# Patient Record
Sex: Male | Born: 1950
Health system: Southern US, Community
[De-identification: ages and names within clinical notes are randomized; demographics above are authoritative.]

## PROBLEM LIST (undated history)

## (undated) DIAGNOSIS — F419 Anxiety disorder, unspecified: Secondary | ICD-10-CM

## (undated) DIAGNOSIS — D759 Disease of blood and blood-forming organs, unspecified: Secondary | ICD-10-CM

## (undated) DIAGNOSIS — H9190 Unspecified hearing loss, unspecified ear: Secondary | ICD-10-CM

## (undated) DIAGNOSIS — G4730 Sleep apnea, unspecified: Secondary | ICD-10-CM

## (undated) DIAGNOSIS — N200 Calculus of kidney: Secondary | ICD-10-CM

## (undated) DIAGNOSIS — J189 Pneumonia, unspecified organism: Secondary | ICD-10-CM

## (undated) DIAGNOSIS — I82409 Acute embolism and thrombosis of unspecified deep veins of unspecified lower extremity: Secondary | ICD-10-CM

## (undated) DIAGNOSIS — C801 Malignant (primary) neoplasm, unspecified: Secondary | ICD-10-CM

## (undated) DIAGNOSIS — F329 Major depressive disorder, single episode, unspecified: Secondary | ICD-10-CM

## (undated) DIAGNOSIS — S065X9A Traumatic subdural hemorrhage with loss of consciousness of unspecified duration, initial encounter: Secondary | ICD-10-CM

## (undated) DIAGNOSIS — I2699 Other pulmonary embolism without acute cor pulmonale: Secondary | ICD-10-CM

## (undated) DIAGNOSIS — E210 Primary hyperparathyroidism: Secondary | ICD-10-CM

## (undated) DIAGNOSIS — Z7901 Long term (current) use of anticoagulants: Secondary | ICD-10-CM

## (undated) DIAGNOSIS — N390 Urinary tract infection, site not specified: Secondary | ICD-10-CM

## (undated) DIAGNOSIS — K219 Gastro-esophageal reflux disease without esophagitis: Secondary | ICD-10-CM

## (undated) DIAGNOSIS — F411 Generalized anxiety disorder: Secondary | ICD-10-CM

## (undated) DIAGNOSIS — G4733 Obstructive sleep apnea (adult) (pediatric): Secondary | ICD-10-CM

## (undated) HISTORY — DX: Obstructive sleep apnea (adult) (pediatric): G47.33

## (undated) HISTORY — DX: Primary hyperparathyroidism: E21.0

## (undated) HISTORY — DX: Generalized anxiety disorder: F41.1

## (undated) HISTORY — PX: BRAIN SURGERY: SHX531

## (undated) HISTORY — PX: COLONOSCOPY: SHX174

## (undated) HISTORY — DX: Unspecified hearing loss, unspecified ear: H91.90

## (undated) HISTORY — DX: Major depressive disorder, single episode, unspecified: F32.9

## (undated) HISTORY — PX: LITHOTRIPSY: SUR834

## (undated) HISTORY — DX: Long term (current) use of anticoagulants: Z79.01

---

## 1992-01-07 DIAGNOSIS — S065XAA Traumatic subdural hemorrhage with loss of consciousness status unknown, initial encounter: Secondary | ICD-10-CM

## 1992-01-07 DIAGNOSIS — S065X9A Traumatic subdural hemorrhage with loss of consciousness of unspecified duration, initial encounter: Secondary | ICD-10-CM

## 1992-01-07 HISTORY — DX: Traumatic subdural hemorrhage with loss of consciousness of unspecified duration, initial encounter: S06.5X9A

## 1992-01-07 HISTORY — DX: Traumatic subdural hemorrhage with loss of consciousness status unknown, initial encounter: S06.5XAA

## 1998-11-23 ENCOUNTER — Encounter: Payer: Self-pay | Admitting: Internal Medicine

## 1998-11-23 ENCOUNTER — Encounter: Admission: RE | Admit: 1998-11-23 | Discharge: 1998-11-23 | Payer: Self-pay | Admitting: Internal Medicine

## 1999-05-08 ENCOUNTER — Emergency Department (HOSPITAL_COMMUNITY): Admission: EM | Admit: 1999-05-08 | Discharge: 1999-05-08 | Payer: Self-pay | Admitting: Emergency Medicine

## 2000-01-20 ENCOUNTER — Encounter: Payer: Self-pay | Admitting: Gastroenterology

## 2000-01-20 ENCOUNTER — Encounter: Admission: RE | Admit: 2000-01-20 | Discharge: 2000-01-20 | Payer: Self-pay | Admitting: Gastroenterology

## 2002-02-02 ENCOUNTER — Ambulatory Visit (HOSPITAL_COMMUNITY): Admission: RE | Admit: 2002-02-02 | Discharge: 2002-02-02 | Payer: Self-pay | Admitting: Specialist

## 2004-02-27 ENCOUNTER — Ambulatory Visit (HOSPITAL_COMMUNITY): Admission: RE | Admit: 2004-02-27 | Discharge: 2004-02-27 | Payer: Self-pay | Admitting: Dermatology

## 2004-03-04 ENCOUNTER — Ambulatory Visit (HOSPITAL_COMMUNITY): Admission: RE | Admit: 2004-03-04 | Discharge: 2004-03-04 | Payer: Self-pay | Admitting: Dermatology

## 2004-03-04 ENCOUNTER — Inpatient Hospital Stay (HOSPITAL_COMMUNITY): Admission: EM | Admit: 2004-03-04 | Discharge: 2004-03-08 | Payer: Self-pay | Admitting: Emergency Medicine

## 2005-08-04 ENCOUNTER — Ambulatory Visit (HOSPITAL_COMMUNITY): Admission: RE | Admit: 2005-08-04 | Discharge: 2005-08-04 | Payer: Self-pay | Admitting: Gastroenterology

## 2005-08-04 ENCOUNTER — Encounter (INDEPENDENT_AMBULATORY_CARE_PROVIDER_SITE_OTHER): Payer: Self-pay | Admitting: *Deleted

## 2008-03-20 ENCOUNTER — Emergency Department (HOSPITAL_COMMUNITY): Admission: EM | Admit: 2008-03-20 | Discharge: 2008-03-20 | Payer: Self-pay | Admitting: Emergency Medicine

## 2009-07-02 ENCOUNTER — Emergency Department (HOSPITAL_COMMUNITY): Admission: EM | Admit: 2009-07-02 | Discharge: 2009-07-02 | Payer: Self-pay | Admitting: Family Medicine

## 2010-04-18 LAB — POCT RAPID STREP A (OFFICE): Streptococcus, Group A Screen (Direct): NEGATIVE

## 2010-05-16 ENCOUNTER — Other Ambulatory Visit: Payer: Self-pay | Admitting: Internal Medicine

## 2010-05-16 ENCOUNTER — Ambulatory Visit
Admission: RE | Admit: 2010-05-16 | Discharge: 2010-05-16 | Disposition: A | Discharge status: Home / Self Care | Payer: Commercial Managed Care - PPO | Source: Ambulatory Visit | Attending: Internal Medicine | Admitting: Internal Medicine

## 2010-05-16 DIAGNOSIS — R109 Unspecified abdominal pain: Secondary | ICD-10-CM

## 2010-05-21 ENCOUNTER — Encounter (HOSPITAL_COMMUNITY): Payer: 59 | Attending: Urology

## 2010-05-21 LAB — CBC
HCT: 46.3 % (ref 39.0–52.0)
Hemoglobin: 16.1 g/dL (ref 13.0–17.0)
MCH: 29 pg (ref 26.0–34.0)
MCHC: 34.8 g/dL (ref 30.0–36.0)
MCV: 83.4 fL (ref 78.0–100.0)
Platelets: 196 10*3/uL (ref 150–400)
RBC: 5.55 MIL/uL (ref 4.22–5.81)
RDW: 14 % (ref 11.5–15.5)
WBC: 7.9 10*3/uL (ref 4.0–10.5)

## 2010-05-21 LAB — BASIC METABOLIC PANEL
BUN: 12 mg/dL (ref 6–23)
CO2: 27 mEq/L (ref 19–32)
Calcium: 10.5 mg/dL (ref 8.4–10.5)
Chloride: 105 mEq/L (ref 96–112)
Creatinine, Ser: 1.09 mg/dL (ref 0.4–1.5)
GFR calc Af Amer: >60 mL/min (ref >60)
GFR calc non Af Amer: >60 mL/min (ref >60)
Glucose, Bld: 90 mg/dL (ref 70–99)
Potassium: 4.3 mEq/L (ref 3.5–5.1)
Sodium: 139 mEq/L (ref 135–145)

## 2010-05-21 LAB — APTT: aPTT: 28 seconds (ref 24–37)

## 2010-05-21 LAB — SURGICAL PCR SCREEN
MRSA, PCR: NEGATIVE
Staphylococcus aureus: NEGATIVE

## 2010-05-21 LAB — PROTIME-INR
INR: 1.02 (ref 0.00–1.49)
Prothrombin Time: 13.6 seconds (ref 11.6–15.2)

## 2010-05-23 ENCOUNTER — Ambulatory Visit (HOSPITAL_COMMUNITY)
Admission: RE | Admit: 2010-05-23 | Discharge: 2010-05-23 | Disposition: A | Discharge status: Home / Self Care | Payer: 59 | Source: Ambulatory Visit | Attending: Urology | Admitting: Urology

## 2010-05-23 DIAGNOSIS — Z538 Procedure and treatment not carried out for other reasons: Secondary | ICD-10-CM | POA: Insufficient documentation

## 2010-05-23 DIAGNOSIS — Z01812 Encounter for preprocedural laboratory examination: Secondary | ICD-10-CM | POA: Insufficient documentation

## 2010-05-23 DIAGNOSIS — N201 Calculus of ureter: Secondary | ICD-10-CM | POA: Insufficient documentation

## 2010-05-24 NOTE — Op Note (Signed)
NAME:  Tyler Mason, Tyler Mason                ACCOUNT NO.:  [id number]   MEDICAL RECORD NO.:  [id number]          PATIENT TYPE:  AMB   LOCATION:  ENDO                         FACILITY:  [facility]   PHYSICIAN:  John C. Madilyn Fireman, M.D.    DATE OF BIRTH:  07-08-1950   DATE OF PROCEDURE:  08/04/2005  DATE OF DISCHARGE:                                 OPERATIVE REPORT   ESOPHAGOGASTRODUODENOSCOPY.   INDICATIONS FOR PROCEDURE:  Patient with chronic reflux undergoing screening  colonoscopy, who is undergoing EGD for one-time Barrett's esophagus  screening.   PROCEDURE:  The patient was placed in the left lateral decubitus position  and placed on the pulse monitor with continuous low-flow oxygen delivered by  nasal cannula.  He was sedated with 37.5 mcg IV fentanyl and 3 mg IV Versed.  The Olympus video endoscope was advanced under direct vision into the  oropharynx and esophagus.  The esophagus was straight and of normal caliber  with squamocolumnar line at 38 cm.  No visible hiatal hernia, ring,  stricture or other abnormality at the GE junction.  The stomach was entered  and a small amount of liquid secretions were suctioned from the fundus.  Retroflex view of cardia was unremarkable.  The fundus, body, antrum and  pylorus all appeared normal.  The duodenum was entered and both bulb and  second portion were well inspected and appeared be within normal limits.  Scope was then withdrawn and the patient returned to the recovery room in  stable condition.  He tolerated the procedure well and there were no  immediate complications.   IMPRESSION:  Normal endoscopy.   PLAN:  Will proceed with colonoscopy as planned.           ______________________________  Everardo All. Madilyn Fireman, M.D.     JCH/MEDQ  D:  08/04/2005  T:  08/04/2005  Job:  [id number]

## 2010-05-24 NOTE — H&P (Signed)
NAME:  Tyler Mason, Tyler Mason                ACCOUNT NO.:  [id number]   MEDICAL RECORD NO.:  [id number]          PATIENT TYPE:  OUT   LOCATION:  VASC                         FACILITY:  [facility]   PHYSICIAN:  Georgann Housekeeper, MD      DATE OF BIRTH:  July 11, 1950   DATE OF ADMISSION:  03/04/2004  DATE OF DISCHARGE:                                HISTORY & PHYSICAL   CHIEF COMPLAINT:  Right leg pain and swelling.   HISTORY OF PRESENT ILLNESS:  The patient is 60 years old with history of  depression and psoriasis.  Was at the dermatology clinic, Dr. Joseph Art with Dr.  Scharlene Gloss clinic, and had complained of some right leg swelling and pain for  the last few days, was worse today.  He was sent at my request to the  Doppler study for labs at [facility] which revealed extensive occlusive  DVT on the posterior tibial and peroneal, popliteal, as well as common  femoral veins throughout.  He was admitted for treatment of this.  The  patient had complained of this pain and swelling for the last few days but  worse for the last 24 hours.  No fevers, no chills, no chest pain.  A little  bit of dyspnea on exertion, although the wife says that he has been having  that also, worse over last two days.  He had a fever one time two days ago  of 102.  The patient has been on Raptiva shots for psoriasis for the last  five or six weeks.  He has been had flu-like symptoms, and he has not been  much mobile because of not feeling well.   PAST MEDICAL HISTORY:   ALLERGIES:  AMPICILLIN and VOLTAREN.   MEDICATIONS:  1.  Lexapro 20 mg daily.  2.  Nexium 40 mg daily.  3.  Remeron 30 mg 1/2 tablet daily.  4.  Raptiva shots per dermatology, Dr. Margo Aye.   MEDICAL PROBLEMS:  1.  Depression.  2.  Psoriasis.  3.  __________.  4.  Heterozygous for hemachromatosis.  5.  Evaluation by Dr. Madilyn Fireman, GI, in the past with mild LFT elevations and no      symptoms; normal LFTs now.  6.  Kidney stones.   PAST SURGICAL HISTORY:  1.   Cataract operation.  2.  Subdural hematoma craniotomy in 1994 after a head injury.  3.  Right eye vitrectomy in 1994.   FAMILY HISTORY:  Father died when he was young.  Mother has hypertension.  Three sisters.   SOCIAL HISTORY:  He is married with two sons.  Wife works as a Engineer, civil (consulting) in [employer].  He is disabled after his back injury in 1994.   PHYSICAL EXAMINATION:  VITAL SIGNS:  Temperature 98, blood pressure 108/72,  pulse 90.  GENERAL: Awake and alert, sitting in the chair.  LUNGS: Clear.  CARDIOVASCULAR:  S1 and S2 without murmur.  ABDOMEN:  Soft.  EXTREMITIES:  Right leg swelling and calf tenderness without any redness.  Chronic skin changes on his feet and toes.  The left leg was normal.  Good  pulses in both legs.   LABORATORY DATA AND OTHER STUDIES:  Doppler study findings showed positive  DVT in the common femoral, posterior tibial, and popliteal vessels of the  right leg.   He had some blood work done at the emergency room:  White count 15.5,  hemoglobin 17.4, platelets 154.  Chemistries showed sodium 139, potassium  4.4, BUN 16, creatinine 1.1.  ALT and AST were mildly elevated at 55 and 65,  respectively, with alkaline phosphatase 197.   EKG:  Sinus.   Chest CT was done and showed multiple small bilateral PEs.   Report from radiology.   ASSESSMENT AND PLAN:  Male with recent treatments with Raptiva for psoriasis  with four to five days of leg swelling and deep vein thrombosis extensively  in the right leg.  The spiral CT shows pulmonary embolus.   1.  Right left deep vein thrombosis.  2.  Pulmonary emboli.   PLAN:  1.  Admit to telemetry.  2.  Lovenox subcutaneously.  3.  Coumadin per pharmacy.  4.  Will with get hypercoagulable workup.  5.  Oxygen.  6.  For depression, continue on current medication.   I discussed the case with his wife.  The patient was sent to the emergency  room from the clinic.      KH/MEDQ  D:  03/04/2004  T:  03/04/2004  Job:   [id number]

## 2010-05-24 NOTE — Op Note (Signed)
NAME:  Tyler Mason, Tyler Mason                ACCOUNT NO.:  [id number]   MEDICAL RECORD NO.:  [id number]          PATIENT TYPE:  AMB   LOCATION:  ENDO                         FACILITY:  [facility]   PHYSICIAN:  John C. Madilyn Fireman, M.D.    DATE OF BIRTH:  1950-09-05   DATE OF PROCEDURE:  08/04/2005  DATE OF DISCHARGE:                                 OPERATIVE REPORT   COLONOSCOPY WITH POLYPECTOMY   INDICATIONS FOR PROCEDURE:  Average risk colon cancer screening in a 60-year-  old patient.   PROCEDURE:  The patient was placed in the left lateral decubitus position  and placed on the pulse monitor with continuous low-flow oxygen delivered by  nasal cannula.  He was sedated with 25 mcg IV fentanyl and 2 mg IV Versed.  The Olympus video colonoscope was inserted into the rectum and advanced to  cecum, confirmed by transillumination of McBurney's point and visualization  of ileocecal valve and appendiceal orifice.  Prep was good.  The cecum and  ascending colon appeared normal with no masses, polyps, diverticula or other  mucosal abnormalities.  Within the tranverse colon there was a 8-mm sessile  polyp that was fulgurated by hot biopsy.  The remainder of the transverse  colon appeared normal.  There was a few scattered diverticula in the  descending and sigmoid colon which otherwise appeared normal.  The rectum  appeared normal.  Retroflex view of the anus revealed no obvious internal  hemorrhoids.  The scope was then withdrawn and the patient returned to the  recovery room in stable condition.  He tolerated the procedure well.  There  were no immediate complications.   IMPRESSION:  1.  Transverse colon polyp.  2.  Left-sided diverticulosis.   PLAN:  Await histology to determine method and interval for future colon  screening.           ______________________________  Everardo All. Madilyn Fireman, M.D.     JCH/MEDQ  D:  08/04/2005  T:  08/04/2005  Job:  [id number]

## 2010-07-12 ENCOUNTER — Other Ambulatory Visit: Payer: Self-pay | Admitting: Gastroenterology

## 2010-12-11 ENCOUNTER — Other Ambulatory Visit: Payer: Self-pay | Admitting: Urology

## 2010-12-25 ENCOUNTER — Encounter (HOSPITAL_COMMUNITY): Payer: Self-pay | Admitting: Pharmacy Technician

## 2011-01-02 ENCOUNTER — Encounter (HOSPITAL_COMMUNITY): Payer: Self-pay | Admitting: *Deleted

## 2011-01-02 NOTE — Pre-Procedure Instructions (Signed)
Patient instructed by Dr. Isidore Moos to stop coumadin Jan 1st. Patient also told to stop vitamins, naproxen, aspirin,ibuprofen,prior to litho. To arrive in SS at [facility] at 0915. NPO after Midnight Sunday. May take meds with a sip if needed. To follow laxative instructions in blue folder. Patient to bring driver, picture ID, insurance info. Patient verbalized understanding of instructions.

## 2011-01-13 ENCOUNTER — Ambulatory Visit (HOSPITAL_COMMUNITY)
Admission: RE | Admit: 2011-01-13 | Discharge: 2011-01-13 | Disposition: A | Discharge status: Home / Self Care | Payer: 59 | Source: Ambulatory Visit | Attending: Urology | Admitting: Urology

## 2011-01-13 ENCOUNTER — Ambulatory Visit (HOSPITAL_COMMUNITY): Payer: 59

## 2011-01-13 ENCOUNTER — Encounter (HOSPITAL_COMMUNITY): Payer: Self-pay | Admitting: *Deleted

## 2011-01-13 ENCOUNTER — Encounter (HOSPITAL_COMMUNITY)
Admission: RE | Disposition: A | Discharge status: Home / Self Care | Payer: Self-pay | Source: Ambulatory Visit | Attending: Urology

## 2011-01-13 DIAGNOSIS — G4730 Sleep apnea, unspecified: Secondary | ICD-10-CM | POA: Insufficient documentation

## 2011-01-13 DIAGNOSIS — N201 Calculus of ureter: Secondary | ICD-10-CM | POA: Diagnosis not present

## 2011-01-13 DIAGNOSIS — G939 Disorder of brain, unspecified: Secondary | ICD-10-CM | POA: Diagnosis not present

## 2011-01-13 HISTORY — DX: Major depressive disorder, single episode, unspecified: F32.9

## 2011-01-13 HISTORY — DX: Gastro-esophageal reflux disease without esophagitis: K21.9

## 2011-01-13 HISTORY — DX: Calculus of kidney: N20.0

## 2011-01-13 HISTORY — DX: Sleep apnea, unspecified: G47.30

## 2011-01-13 HISTORY — DX: Disease of blood and blood-forming organs, unspecified: D75.9

## 2011-01-13 LAB — PROTIME-INR
INR: 0.94 (ref 0.00–1.49)
Prothrombin Time: 12.8 seconds (ref 11.6–15.2)

## 2011-01-13 SURGERY — LITHOTRIPSY, ESWL
Anesthesia: LOCAL | Laterality: Right

## 2011-01-13 MED ORDER — TAMSULOSIN HCL 0.4 MG PO CAPS: 0.4000 mg | ORAL_CAPSULE | ORAL | Status: DC

## 2011-01-13 MED ORDER — DIAZEPAM 5 MG PO TABS
ORAL_TABLET | ORAL | Status: AC
  Administered 2011-01-13: 10 mg via ORAL
  Filled 2011-01-13: qty 2

## 2011-01-13 MED ORDER — DIAZEPAM 5 MG PO TABS
10.0000 mg | ORAL_TABLET | ORAL | Status: AC
  Administered 2011-01-13: 10 mg via ORAL

## 2011-01-13 MED ORDER — SODIUM CHLORIDE 0.9 % IV SOLN
INTRAVENOUS | Status: DC
  Administered 2011-01-13: 11:00:00 via INTRAVENOUS

## 2011-01-13 MED ORDER — DIPHENHYDRAMINE HCL 25 MG PO CAPS
25.0000 mg | ORAL_CAPSULE | ORAL | Status: AC
  Administered 2011-01-13: 25 mg via ORAL

## 2011-01-13 MED ORDER — DIPHENHYDRAMINE HCL 25 MG PO CAPS
ORAL_CAPSULE | ORAL | Status: AC
  Administered 2011-01-13: 25 mg via ORAL
  Filled 2011-01-13: qty 1

## 2011-01-13 MED ORDER — CIPROFLOXACIN HCL 500 MG PO TABS
ORAL_TABLET | ORAL | Status: AC
  Administered 2011-01-13: 500 mg via ORAL
  Filled 2011-01-13: qty 1

## 2011-01-13 MED ORDER — HYDROCODONE-ACETAMINOPHEN 7.5-325 MG PO TABS: 1.0000 | ORAL_TABLET | ORAL | Status: AC | PRN

## 2011-01-13 MED ORDER — CIPROFLOXACIN HCL 500 MG PO TABS
500.0000 mg | ORAL_TABLET | ORAL | Status: AC
  Administered 2011-01-13: 500 mg via ORAL

## 2011-01-13 MED ORDER — OXYCODONE HCL 5 MG PO TABS: 10.0000 mg | ORAL_TABLET | ORAL | Status: DC | PRN

## 2011-01-13 NOTE — H&P (Signed)
History of Present Illness     Nephrolithiasis: he had bilateral ureteral calculi noted on a CT scan done on 05/16/10. We discussed ureteroscopic extraction of the stones and he was scheduled for surgery but past both of his stones spontaneously. He has a history of having undergone prior stone manipulation by Dr. Patsi Sears in 1995. His CT scan in 5/12 revealed bilateral nonobstructing nephrolithiasis as well. Stone analysis: 60% calcium oxalate, 40% calcium phosphate.    Interval history: He was recently in to see his primary care physician and found to have a creatinine on 01/14/10 of 1.27 with a PSA of 0.46. He reports that he was told his urine was infected however culture was not performed. He was treated empirically with antibiotics. He reports that for about 2 weeks he's had some pain in his left side. He's not had any gross hematuria. He said the pain actually has improved over time.   Past Medical History Problems  1. History of  Depression 311 2. History of  Esophageal Reflux 530.81 3. History of  Hypercholesterolemia 272.0 4. 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 History of  Pulmonary Embolism V12.55 6. History of  Venous Thrombosis Of The Deep Vessels Of The Lower Extremity 453.40  Surgical History Problems  1. History of  Burr Holes For Evacuation Of Subdural Hematoma 2. History of  Cataract Surgery Bilateral 3. History of  Cystoscopy With Manipulation Of Ureteral Calculus  Current Meds 1. Coumadin 5 MG Oral Tablet; pt takes daily; Therapy: (Recorded:11May2012) to 2. Coumadin 7.5 MG Oral Tablet; pt takes on tuesday and thursday; Therapy:  (Recorded:11May2012) to 3. Fish Oil 1000 MG Oral Capsule; Therapy: (Recorded:11May2012) to 4. NexIUM 40 MG Oral Capsule Delayed Release; Therapy: (Recorded:11May2012) to 5. Remeron 30 MG Oral Tablet; Therapy: (Recorded:11May2012) to 6. Simvastatin 20 MG Oral Tablet; Therapy: (Recorded:11May2012) to 7.  Vicodin TABS; TAKE TABLET  PRN; Therapy: (Recorded:11May2012) to 8. Zoloft 50 MG Oral Tablet; Therapy: (Recorded:11May2012) to  Allergies Medication  1. Ampicillin CAPS  Family History Problems  1. Family history of  Acute Myocardial Infarction V17.3 2. Family history of  Death In The Family Father 3. Family history of  Family Health Status - Mother's Age 72 4. Family history of  Family Health Status Children ___ Living Sons 2 5. Family history of  Hypertension V17.49 6. Family history of  Prostate Cancer V16.42 7. Family history of  Stroke Syndrome V17.1  Social History Problems  1. Marital History - Currently Married 2. Physical Disability Affecting Ability To Work 3. Tobacco Use 305.1 smokes 1 ppd Denied  4. History of  Alcohol Use 5. History of  Caffeine Use  Review of Systems Genitourinary and gastrointestinal system(s) were reviewed and pertinent findings if present are noted.  Genitourinary: nocturia and urinary stream starts and stops, but no hematuria.  Gastrointestinal: flank pain.    Vitals Vital Signs BMI Calculated: 29.78 BSA Calculated: 2.12 Height: 5 ft 10 in Weight: 208 lb  Blood Pressure: 113 / 75 Heart Rate: 71   Physical Exam Constitutional: Well nourished and well developed . No acute distress.  ENT:. The ears and nose are normal in appearance.  Neck: The appearance of the neck is normal and no neck mass is present.  Pulmonary: No respiratory distress and normal respiratory rhythm and effort.  Cardiovascular: Heart rate and rhythm are normal . No peripheral edema.  Abdomen: The abdomen is soft and nontender. No masses are palpated. No CVA tenderness. No hernias are  palpable. No hepatosplenomegaly noted.  Lymphatics: The femoral and inguinal nodes are not enlarged or tender.  Skin: Normal skin turgor, no visible rash and no visible skin lesions.  Neuro/Psych:. Mood and affect are appropriate.  Genitourinary: Examination of the penis  demonstrates swelling, but no discharge, no masses, no lesions and a normal meatus. The penis is circumcised. The scrotum is without lesions. The right epididymis is palpably normal and non-tender. The left epididymis is palpably normal and non-tender. The right testis is non-tender and without masses. The left testis is non-tender and without masses.   Assessment Assessed  1. Nephrolithiasis Of Both Kidneys 592.0 2. Mid Ureteral Stone On The Right 592.1 3. Hydronephrosis On The Right 591      Though he indicated that he passed a stones I see a new calcification in his right ureter but no palpitations along the course of the left ureter. Some stones are seen overlying his left kidney. Because of his history I therefore obtained a CT scan today. The scan revealed that he did pass his stone on the left-hand side but has a stone in the midureter on the right that appears to have Hounsfield units of about 900. It measures 7 x 4.8 mm. There is associated hydronephrosis. I therefore have discussed treatment with lithotripsy versus ureteroscopy. Its location and size would allow lithotripsy which he would like to proceed with at this time. We discussed the procedure in detail including its risks and complications and the alternatives. He understands and has elected to proceed. He was previously cleared to stop his Coumadin for his ureteroscopy which was not necessary. I will have him stop his Coumadin again before this procedure.   Plan Health Maintenance (V70.0)  1. UA With REFLEX  Done: 05Dec2012 08:09AM Hydronephrosis On The Right (591), Mid Ureteral Stone On The Right (592.1)  2. Follow-up Schedule Surgery Office  Follow-up  Requested for: 05Dec2012 Nephrolithiasis Of Both Kidneys (592.0)  3. KUB  Done: 05Dec2012 12:00AM Nephrolithiasis Of Both Kidneys (592.0), Hydronephrosis Bilaterally (591),   4. AU CT-STONE PROTOCOL  Done: 05Dec2012 12:00AM   1. He will stop his Coumadin 5 days prior to his  lithotripsy. 2. A PT will be checked the morning of his lithotripsy. 3. He will be scheduled for lithotripsy of his right mid ureteral stone.

## 2011-01-13 NOTE — Op Note (Signed)
See [facility] OP note scanned into chart. 

## 2011-01-13 NOTE — Progress Notes (Signed)
Up to BR tolerated activity well. Good po no n/v voided large amount of clear yellow urine reviewed discharge instructions with patient and wife.

## 2011-01-13 NOTE — Interval H&P Note (Signed)
History and Physical Interval Note:  01/13/2011 10:46 AM  Tyler Mason  has presented today for surgery, with the diagnosis of Right Ureteral Stone  The various methods of treatment have been discussed with the patient and family. After consideration of risks, benefits and other options for treatment, the patient has consented to  Procedure(s): EXTRACORPOREAL SHOCK WAVE LITHOTRIPSY (ESWL) as a surgical intervention .  The patients' history has been reviewed, patient examined, no change in status, stable for surgery.  I have reviewed the patients' chart and labs.  Questions were answered to the patient's satisfaction.     [place] Farm

## 2011-01-20 DIAGNOSIS — R82998 Other abnormal findings in urine: Secondary | ICD-10-CM | POA: Diagnosis not present

## 2011-01-20 DIAGNOSIS — N201 Calculus of ureter: Secondary | ICD-10-CM | POA: Diagnosis not present

## 2011-01-28 DIAGNOSIS — Z7901 Long term (current) use of anticoagulants: Secondary | ICD-10-CM | POA: Diagnosis not present

## 2011-02-05 DIAGNOSIS — I776 Arteritis, unspecified: Secondary | ICD-10-CM | POA: Diagnosis not present

## 2011-04-15 DIAGNOSIS — F0630 Mood disorder due to known physiological condition, unspecified: Secondary | ICD-10-CM | POA: Diagnosis not present

## 2011-07-22 DIAGNOSIS — N200 Calculus of kidney: Secondary | ICD-10-CM | POA: Diagnosis not present

## 2011-08-21 DIAGNOSIS — F0630 Mood disorder due to known physiological condition, unspecified: Secondary | ICD-10-CM | POA: Diagnosis not present

## 2011-11-18 ENCOUNTER — Encounter (HOSPITAL_COMMUNITY): Payer: Self-pay | Admitting: Emergency Medicine

## 2011-11-18 ENCOUNTER — Emergency Department (HOSPITAL_COMMUNITY)
Admission: EM | Admit: 2011-11-18 | Discharge: 2011-11-18 | Disposition: A | Discharge status: Home / Self Care | Payer: 59 | Attending: Emergency Medicine | Admitting: Emergency Medicine

## 2011-11-18 DIAGNOSIS — Z87828 Personal history of other (healed) physical injury and trauma: Secondary | ICD-10-CM | POA: Insufficient documentation

## 2011-11-18 DIAGNOSIS — F3289 Other specified depressive episodes: Secondary | ICD-10-CM | POA: Insufficient documentation

## 2011-11-18 DIAGNOSIS — Z87442 Personal history of urinary calculi: Secondary | ICD-10-CM | POA: Insufficient documentation

## 2011-11-18 DIAGNOSIS — K219 Gastro-esophageal reflux disease without esophagitis: Secondary | ICD-10-CM | POA: Insufficient documentation

## 2011-11-18 DIAGNOSIS — N390 Urinary tract infection, site not specified: Secondary | ICD-10-CM | POA: Insufficient documentation

## 2011-11-18 DIAGNOSIS — Z862 Personal history of diseases of the blood and blood-forming organs and certain disorders involving the immune mechanism: Secondary | ICD-10-CM | POA: Insufficient documentation

## 2011-11-18 DIAGNOSIS — I82409 Acute embolism and thrombosis of unspecified deep veins of unspecified lower extremity: Secondary | ICD-10-CM | POA: Insufficient documentation

## 2011-11-18 DIAGNOSIS — Z87891 Personal history of nicotine dependence: Secondary | ICD-10-CM | POA: Insufficient documentation

## 2011-11-18 DIAGNOSIS — G4730 Sleep apnea, unspecified: Secondary | ICD-10-CM | POA: Insufficient documentation

## 2011-11-18 DIAGNOSIS — F329 Major depressive disorder, single episode, unspecified: Secondary | ICD-10-CM | POA: Insufficient documentation

## 2011-11-18 DIAGNOSIS — R509 Fever, unspecified: Secondary | ICD-10-CM | POA: Insufficient documentation

## 2011-11-18 HISTORY — DX: Traumatic subdural hemorrhage with loss of consciousness of unspecified duration, initial encounter: S06.5X9A

## 2011-11-18 HISTORY — DX: Acute embolism and thrombosis of unspecified deep veins of unspecified lower extremity: I82.409

## 2011-11-18 LAB — CBC WITH DIFFERENTIAL/PLATELET
Basophils Absolute: 0 10*3/uL (ref 0.0–0.1)
Basophils Relative: 0 % (ref 0–1)
Eosinophils Absolute: 0 10*3/uL (ref 0.0–0.7)
Eosinophils Relative: 0 % (ref 0–5)
HCT: 45.9 % (ref 39.0–52.0)
Hemoglobin: 15.9 g/dL (ref 13.0–17.0)
Lymphocytes Relative: 3 % — ABNORMAL LOW (ref 12–46)
Lymphs Abs: 0.6 10*3/uL — ABNORMAL LOW (ref 0.7–4.0)
MCH: 29.7 pg (ref 26.0–34.0)
MCHC: 34.6 g/dL (ref 30.0–36.0)
MCV: 85.6 fL (ref 78.0–100.0)
Monocytes Absolute: 1.7 10*3/uL — ABNORMAL HIGH (ref 0.1–1.0)
Monocytes Relative: 9 % (ref 3–12)
Neutro Abs: 16.7 10*3/uL — ABNORMAL HIGH (ref 1.7–7.7)
Neutrophils Relative %: 88 % — ABNORMAL HIGH (ref 43–77)
Platelets: 147 10*3/uL — ABNORMAL LOW (ref 150–400)
RBC: 5.36 MIL/uL (ref 4.22–5.81)
RDW: 14 % (ref 11.5–15.5)
WBC: 19 10*3/uL — ABNORMAL HIGH (ref 4.0–10.5)

## 2011-11-18 LAB — URINE MICROSCOPIC-ADD ON

## 2011-11-18 LAB — BASIC METABOLIC PANEL
BUN: 13 mg/dL (ref 6–23)
CO2: 21 mEq/L (ref 19–32)
Calcium: 10.2 mg/dL (ref 8.4–10.5)
Chloride: 109 mEq/L (ref 96–112)
Creatinine, Ser: 1.03 mg/dL (ref 0.50–1.35)
GFR calc Af Amer: 89 mL/min — ABNORMAL LOW (ref >90)
GFR calc non Af Amer: 77 mL/min — ABNORMAL LOW (ref >90)
Glucose, Bld: 98 mg/dL (ref 70–99)
Potassium: 4 mEq/L (ref 3.5–5.1)
Sodium: 139 mEq/L (ref 135–145)

## 2011-11-18 LAB — URINALYSIS, ROUTINE W REFLEX MICROSCOPIC
Glucose, UA: NEGATIVE mg/dL
Ketones, ur: 15 mg/dL — AB
Nitrite: POSITIVE — AB
Protein, ur: 100 mg/dL — AB
Specific Gravity, Urine: 1.023 (ref 1.005–1.030)
Urobilinogen, UA: 1 mg/dL (ref 0.0–1.0)
pH: 6 (ref 5.0–8.0)

## 2011-11-18 LAB — LACTIC ACID, PLASMA: Lactic Acid, Venous: 1.4 mmol/L (ref 0.5–2.2)

## 2011-11-18 LAB — PROTIME-INR
INR: 2.5 — ABNORMAL HIGH (ref 0.00–1.49)
Prothrombin Time: 25.8 seconds — ABNORMAL HIGH (ref 11.6–15.2)

## 2011-11-18 MED ORDER — SODIUM CHLORIDE 0.9 % IV BOLUS (SEPSIS)
1000.0000 mL | Freq: Once | INTRAVENOUS | Status: AC
  Administered 2011-11-18: 1000 mL via INTRAVENOUS

## 2011-11-18 MED ORDER — DEXTROSE 5 % IV SOLN
1.0000 g | Freq: Once | INTRAVENOUS | Status: AC
  Administered 2011-11-18: 1 g via INTRAVENOUS
  Filled 2011-11-18: qty 10

## 2011-11-18 MED ORDER — CEPHALEXIN 500 MG PO CAPS: 500.0000 mg | ORAL_CAPSULE | Freq: Two times a day (BID) | ORAL | Status: DC

## 2011-11-18 MED ORDER — ACETAMINOPHEN 325 MG PO TABS
650.0000 mg | ORAL_TABLET | Freq: Once | ORAL | Status: AC
  Administered 2011-11-18: 650 mg via ORAL
  Filled 2011-11-18: qty 2

## 2011-11-18 MED ORDER — ONDANSETRON HCL 4 MG/2ML IJ SOLN
4.0000 mg | Freq: Once | INTRAMUSCULAR | Status: AC
  Administered 2011-11-18: 4 mg via INTRAVENOUS
  Filled 2011-11-18: qty 2

## 2011-11-18 MED ORDER — ONDANSETRON 8 MG PO TBDP: 8.0000 mg | ORAL_TABLET | Freq: Three times a day (TID) | ORAL | Status: DC | PRN

## 2011-11-18 NOTE — ED Notes (Signed)
Pt c/o of chills, nausea and vomiting starting last night about mednight. Current Temp 103.0 and the temp was 98 at the doctor office at 1100 today.

## 2011-11-18 NOTE — ED Provider Notes (Signed)
Patient seen in POD B.  Patient states he is feeling better after IV fluids and medications.  Tachycardia resolved, fever improved.  Patient discharged home with prescriptions for antibiotic and anti-emetic.  Urine culture pending.  Patient to follow-up with his PCP.  Jimmye Norman, NP 11/18/11 2236

## 2011-11-18 NOTE — ED Provider Notes (Signed)
History     CSN: [id number]  Arrival date & time 11/18/11  1253   First MD Initiated Contact with Patient 11/18/11 1330      Chief Complaint  Patient presents with  . Chills  . Nausea  . Emesis    (Consider location/radiation/quality/duration/timing/severity/associated sxs/prior treatment) HPI Pt started with chills, dysuria last night. Today it continued along with nausea/vomitting and he went to PCP for regular check up. There began feeling worse and was sent here for further evalution. Pt denies any cough, abdominal pain, rash.   Pain described as mild burning in intensity.    Associated symptoms: fever/chils. No chest pain. No AMS. Still making urine.  He reports a history of UTIs and kidney stones with prior urology evaluation.   Past Medical History  Diagnosis Date  . Sleep apnea   . GERD (gastroesophageal reflux disease)   . Depression   . Blood dyscrasia     patient on chronic coumadin  . Kidney calculus   . SDH (subdural hematoma)   . DVT (deep venous thrombosis)     Past Surgical History  Procedure Date  . Brain surgery     head injury from tree limb   . Colonoscopy     Family History  Problem Relation Age of Onset  . Hypertension Mother   . Thyroid disease Mother     History  Substance Use Topics  . Smoking status: Former Smoker -- 1.0 packs/day    Quit date: 01/01/2001  . Smokeless tobacco: Not on file  . Alcohol Use: No      Review of Systems Negative for respiratory distress, cough. Negative for chest pain, abdominal pain.   All other systems reviewed and negative unless noted in HPI.    Allergies  Ampicillin  Home Medications   Current Outpatient Rx  Name  Route  Sig  Dispense  Refill  . ESOMEPRAZOLE MAGNESIUM 40 MG PO CPDR   Oral   Take 40 mg by mouth daily before breakfast.           . MIRTAZAPINE 15 MG PO TABS   Oral   Take 15 mg by mouth at bedtime.           . ADULT MULTIVITAMIN W/MINERALS CH   Oral   Take 1  tablet by mouth daily.           Marland Kitchen NAPROXEN SODIUM 220 MG PO TABS   Oral   Take 220 mg by mouth 2 (two) times daily as needed. Pain           . FISH OIL 1200 MG PO CAPS   Oral   Take 1 capsule by mouth 2 (two) times daily.           Marland Kitchen PSEUDOEPHEDRINE-NAPROXEN NA ER 120-220 MG PO TB12   Oral   Take 1 tablet by mouth every 12 (twelve) hours as needed. Congestion           . SERTRALINE HCL 50 MG PO TABS   Oral   Take 50 mg by mouth daily after breakfast.           . SIMVASTATIN 20 MG PO TABS   Oral   Take 20 mg by mouth at bedtime.           . TAMSULOSIN HCL 0.4 MG PO CAPS   Oral   Take 1 capsule (0.4 mg total) by mouth daily after supper.   30 capsule   11   .  WARFARIN SODIUM 5 MG PO TABS   Oral   Take 5-7.5 mg by mouth daily. 1.5 tablet Tuesday and Thursday. 1 tablet all other days               BP 116/67  Pulse 120  Temp 103 F (39.4 C) (Oral)  Resp 20  SpO2 95%  Physical Exam Nursing note and vitals reviewed.  Constitutional: Pt is alert and appears stated age. Oropharynx: Airway open without erythema or exudate. Respiratory: No respiratory distress. Equal breathing bilaterally. CV: Extremities warm and well perfused. Tachycardic.  Neuro: No motor nor sensory deficit. Head: Normocephalic and atraumatic. Eyes: No conjunctivitis, no scleral icterus. Neck: Supple, no mass. Chest: Non-tender. Abdomen: Soft, non-tender MSK: Extremities are atraumatic without deformity. Skin: No rash, no wounds.Warm.   ED Course  Procedures (including critical care time)  Labs Reviewed  URINALYSIS, ROUTINE W REFLEX MICROSCOPIC - Abnormal; Notable for the following:    Color, Urine AMBER (*)  BIOCHEMICALS MAY BE AFFECTED BY COLOR   APPearance CLOUDY (*)     Hgb urine dipstick SMALL (*)     Bilirubin Urine SMALL (*)     Ketones, ur 15 (*)     Protein, ur 100 (*)     Nitrite POSITIVE (*)     Leukocytes, UA LARGE (*)     All other components within  normal limits  CBC WITH DIFFERENTIAL - Abnormal; Notable for the following:    WBC 19.0 (*)     Platelets 147 (*)     Neutrophils Relative 88 (*)     Neutro Abs 16.7 (*)     Lymphocytes Relative 3 (*)     Lymphs Abs 0.6 (*)     Monocytes Absolute 1.7 (*)     All other components within normal limits  BASIC METABOLIC PANEL - Abnormal; Notable for the following:    GFR calc non Af Amer 77 (*)     GFR calc Af Amer 89 (*)     All other components within normal limits  PROTIME-INR - Abnormal; Notable for the following:    Prothrombin Time 25.8 (*)     INR 2.50 (*)     All other components within normal limits  URINE MICROSCOPIC-ADD ON - Abnormal; Notable for the following:    Bacteria, UA MANY (*)     All other components within normal limits  LACTIC ACID, PLASMA  URINE CULTURE  URINE CULTURE   No results found.   1. UTI (lower urinary tract infection)   2. Fever       MDM  61 yo M with pertinent PMHx of DVT on coumadin, kidney stones here with fever, tachycardia, dysuria c/w urosepsis. Pt looks well. He is non-toxic appearing. Will hold abx until diagnosis is confirmed. Doubt severe sepsis with elevated lactate or end organ damage but will screen. Pt given IV fluid, IV zofran. PO tylenol for fever.   CBC with leukocytosis, INr therapeutic. Lactic acid low. BMP unremarkable. Ua c/w infection. Pt with listed ampicillin allergy with tongue soreness but discussed with pt and not life threatening. Pt doing well on re-eval with no acute complaint. HR 102 with no tylenol given yet. Ordered rocephin along with urine culture. Pt moved to CDU for monitoring and d/c home with abx and zofran as long as he is doing well. Feel that is appropriate with close f/u. Pt verbalized understanding and would prefer to go home.         Charm Barges, MD  11/18/11 1638 

## 2011-11-18 NOTE — ED Notes (Signed)
Theodoro Grist NP at bedside for reevaluation

## 2011-11-18 NOTE — ED Provider Notes (Signed)
I saw and evaluated the patient, reviewed the resident's note and I agree with the findings and plan. Has hx of uti.  C/o fever and n/v.  No cough. No pain.  No light headedness. No sob.  pe no distress.  Febrile and tachycardic.  No hypotension.  Will check blood and labs.  No indication for cxr.    Dx. Burton Apley. Urosepsis but pt in no distress. Will give abxs and place in cdu.  If he improves, he can go home.   Cheri Guppy, MD 11/18/11 [phone]

## 2011-11-19 NOTE — ED Provider Notes (Signed)
Medical screening examination/treatment/procedure(s) were conducted as a shared visit with non-physician practitioner(s) and myself.  I personally evaluated the patient during the encounter  Cheri Guppy, MD 11/19/11 [phone]

## 2011-11-19 NOTE — ED Provider Notes (Signed)
Medical screening examination/treatment/procedure(s) were performed by non-physician practitioner and as supervising physician I was immediately available for consultation/collaboration.   Dione Booze, MD 11/19/11 [phone]

## 2011-11-20 LAB — URINE CULTURE: Colony Count: >=100000

## 2011-11-21 NOTE — ED Notes (Signed)
+   Urine Patient treated with Keflex-sensitive to same-chart appended per protocol MD. 

## 2011-12-21 ENCOUNTER — Encounter (HOSPITAL_COMMUNITY): Payer: Self-pay | Admitting: *Deleted

## 2011-12-21 ENCOUNTER — Emergency Department (INDEPENDENT_AMBULATORY_CARE_PROVIDER_SITE_OTHER)
Admission: EM | Admit: 2011-12-21 | Discharge: 2011-12-21 | Disposition: A | Discharge status: Home / Self Care | Payer: 59 | Source: Home / Self Care

## 2011-12-21 DIAGNOSIS — N390 Urinary tract infection, site not specified: Secondary | ICD-10-CM

## 2011-12-21 LAB — POCT URINALYSIS DIP (DEVICE)
Bilirubin Urine: NEGATIVE
Glucose, UA: NEGATIVE mg/dL
Ketones, ur: NEGATIVE mg/dL
Leukocytes, UA: NEGATIVE
Nitrite: NEGATIVE
Protein, ur: NEGATIVE mg/dL
Specific Gravity, Urine: >=1.03 (ref 1.005–1.030)
Urobilinogen, UA: 0.2 mg/dL (ref 0.0–1.0)
pH: 6 (ref 5.0–8.0)

## 2011-12-21 MED ORDER — CEPHALEXIN 500 MG PO CAPS: 500.0000 mg | ORAL_CAPSULE | Freq: Four times a day (QID) | ORAL | Status: DC

## 2011-12-21 NOTE — ED Provider Notes (Signed)
Medical screening examination/treatment/procedure(s) were performed by non-physician practitioner and as supervising physician I was immediately available for consultation/collaboration.  Raynald Blend, MD 12/21/11 Rickey Primus

## 2011-12-21 NOTE — ED Provider Notes (Signed)
History     CSN: [id number]  Arrival date & time 12/21/11  1258   None     Chief Complaint  Patient presents with  . Urinary Tract Infection    (Consider location/radiation/quality/duration/timing/severity/associated sxs/prior treatment) HPI Comments: 61 year old male presents with burning in urination and discolored urine with urinary frequency for approximately one week. He states he had a UTI approximately 3-4 weeks ago and was treated, she department. He has a history of UTIs as well as kidney stones. He is denying any pain or symptoms of kidney stones at this time. He denies feeling ill, nausea or vomiting, back pain or pelvic pain.   Past Medical History  Diagnosis Date  . Sleep apnea   . GERD (gastroesophageal reflux disease)   . Depression   . Blood dyscrasia     patient on chronic coumadin  . Kidney calculus   . SDH (subdural hematoma)   . DVT (deep venous thrombosis)     Past Surgical History  Procedure Date  . Brain surgery     head injury from tree limb   . Colonoscopy     Family History  Problem Relation Age of Onset  . Hypertension Mother   . Thyroid disease Mother     History  Substance Use Topics  . Smoking status: Former Smoker -- 1.0 packs/day    Quit date: 01/01/2001  . Smokeless tobacco: Not on file  . Alcohol Use: No      Review of Systems  Constitutional: Negative for fever, diaphoresis, activity change and fatigue.  HENT: Positive for sore throat. Negative for neck pain and neck stiffness.   Eyes: Negative.   Respiratory: Negative for cough, chest tightness, shortness of breath and wheezing.   Cardiovascular: Negative for chest pain, palpitations and leg swelling.  Gastrointestinal: Negative.  Negative for nausea, vomiting and diarrhea.  Genitourinary: Positive for urgency and frequency. Negative for flank pain, discharge, penile swelling and difficulty urinating.       See history of present illness  Musculoskeletal: Negative.    Skin: Negative for color change and rash.  Neurological: Negative.   Psychiatric/Behavioral: Negative.  Negative for behavioral problems.    Allergies  Ampicillin  Home Medications   Current Outpatient Rx  Name  Route  Sig  Dispense  Refill  . CEPHALEXIN 500 MG PO CAPS   Oral   Take 1 capsule (500 mg total) by mouth 2 (two) times daily.   20 capsule   0   . CEPHALEXIN 500 MG PO CAPS   Oral   Take 1 capsule (500 mg total) by mouth 4 (four) times daily.   28 capsule   0   . MIRTAZAPINE 30 MG PO TABS   Oral   Take 30 mg by mouth at bedtime.         Marland Kitchen NAPROXEN SODIUM 220 MG PO TABS   Oral   Take 220 mg by mouth 2 (two) times daily as needed. Pain           . FISH OIL 1200 MG PO CAPS   Oral   Take 1 capsule by mouth 2 (two) times daily.           Marland Kitchen ONDANSETRON 8 MG PO TBDP   Oral   Take 1 tablet (8 mg total) by mouth every 8 (eight) hours as needed for nausea.   20 tablet   0   . PSEUDOEPHEDRINE-NAPROXEN NA ER 120-220 MG PO TB12   Oral   Take  1 tablet by mouth every 12 (twelve) hours as needed. Congestion           . SERTRALINE HCL 50 MG PO TABS   Oral   Take 50 mg by mouth daily after breakfast.           . SIMVASTATIN 20 MG PO TABS   Oral   Take 20 mg by mouth at bedtime.           . WARFARIN SODIUM 5 MG PO TABS   Oral   Take 5-7.5 mg by mouth daily. 1.5 tablet Tuesday, Thursday, Saturday. 1 tablet all other days           BP 133/80  Pulse 82  Temp 97.9 F (36.6 C) (Oral)  Resp 16  SpO2 97%  Physical Exam  Nursing note and vitals reviewed. Constitutional: He is oriented to person, place, and time. He appears well-developed and well-nourished. No distress.  HENT:  Head: Normocephalic and atraumatic.  Neck: Normal range of motion. Neck supple.  Cardiovascular: Normal rate, regular rhythm and normal heart sounds.   Pulmonary/Chest: Effort normal and breath sounds normal. No respiratory distress. He has no wheezes.  Abdominal:  Soft. There is no tenderness.  Musculoskeletal: Normal range of motion. He exhibits no edema and no tenderness.  Neurological: He is alert and oriented to person, place, and time. No cranial nerve deficit.       The patient tells me that he had a head injury several years ago and due to that he has problems with memory. It is noted that he is a bit slow to respond to any and he states he understands instructions and other information.  Skin: Skin is warm and dry. No rash noted.  Psychiatric: He has a normal mood and affect.    ED Course  Procedures (including critical care time)  Labs Reviewed  POCT URINALYSIS DIP (DEVICE) - Abnormal; Notable for the following:    Hgb urine dipstick TRACE (*)     All other components within normal limits  URINE CULTURE   No results found.   1. UTI (lower urinary tract infection)       MDM  To call his urologist tomorrow to obtain an appointment. Keflex 500 mg 4 times a day for 7 day Urine culture Drink plenty of fluids stay well hydrated He does not appear toxic or is exhibiting sick behavior at this time. He got he denies having any pain.        Hayden Rasmussen, NP 12/21/11 [phone]

## 2011-12-21 NOTE — ED Notes (Signed)
Patient complains of urinary frequency, burning sensation and pain while voiding x 1 day; also, complains of diarrhea. Patient states he had UTI 3 weeks ago that led to being admitted to the the ED. Denies nausea, vomiting, fever/chills.

## 2012-01-08 DIAGNOSIS — N390 Urinary tract infection, site not specified: Secondary | ICD-10-CM | POA: Diagnosis not present

## 2012-01-08 DIAGNOSIS — N200 Calculus of kidney: Secondary | ICD-10-CM | POA: Diagnosis not present

## 2012-02-16 DIAGNOSIS — N390 Urinary tract infection, site not specified: Secondary | ICD-10-CM | POA: Diagnosis not present

## 2012-03-04 DIAGNOSIS — N390 Urinary tract infection, site not specified: Secondary | ICD-10-CM | POA: Diagnosis not present

## 2012-03-10 DIAGNOSIS — F0630 Mood disorder due to known physiological condition, unspecified: Secondary | ICD-10-CM | POA: Diagnosis not present

## 2012-04-06 DIAGNOSIS — R300 Dysuria: Secondary | ICD-10-CM | POA: Diagnosis not present

## 2012-04-06 DIAGNOSIS — N390 Urinary tract infection, site not specified: Secondary | ICD-10-CM | POA: Diagnosis not present

## 2012-04-12 DIAGNOSIS — R300 Dysuria: Secondary | ICD-10-CM | POA: Diagnosis not present

## 2012-04-12 DIAGNOSIS — N200 Calculus of kidney: Secondary | ICD-10-CM | POA: Diagnosis not present

## 2012-04-12 DIAGNOSIS — N390 Urinary tract infection, site not specified: Secondary | ICD-10-CM | POA: Diagnosis not present

## 2012-04-12 DIAGNOSIS — R3129 Other microscopic hematuria: Secondary | ICD-10-CM | POA: Diagnosis not present

## 2012-07-06 DIAGNOSIS — N390 Urinary tract infection, site not specified: Secondary | ICD-10-CM | POA: Diagnosis not present

## 2012-12-09 ENCOUNTER — Encounter (HOSPITAL_COMMUNITY): Payer: Self-pay | Admitting: Emergency Medicine

## 2012-12-09 ENCOUNTER — Emergency Department (INDEPENDENT_AMBULATORY_CARE_PROVIDER_SITE_OTHER)
Admission: EM | Admit: 2012-12-09 | Discharge: 2012-12-09 | Disposition: A | Discharge status: Home / Self Care | Payer: 59 | Source: Home / Self Care

## 2012-12-09 DIAGNOSIS — N390 Urinary tract infection, site not specified: Secondary | ICD-10-CM

## 2012-12-09 LAB — POCT URINALYSIS DIP (DEVICE)
Bilirubin Urine: NEGATIVE
Glucose, UA: NEGATIVE mg/dL
Ketones, ur: NEGATIVE mg/dL
Nitrite: NEGATIVE
Protein, ur: NEGATIVE mg/dL
Specific Gravity, Urine: 1.025 (ref 1.005–1.030)
Urobilinogen, UA: 1 mg/dL (ref 0.0–1.0)
pH: 6.5 (ref 5.0–8.0)

## 2012-12-09 MED ORDER — CEPHALEXIN 500 MG PO CAPS: 500.0000 mg | ORAL_CAPSULE | Freq: Four times a day (QID) | ORAL | Status: DC

## 2012-12-09 NOTE — ED Notes (Signed)
Pt  Has  Had  uti  In past       He  reports  Dysuria    Dark  Urine  As  Well  As  Nausea  And  Vomiting

## 2012-12-09 NOTE — ED Provider Notes (Signed)
Tyler Mason is a 62 y.o. male who presents to Urgent Care today for UTI symptoms. Patient has a history of frequent urinary tract infections. He typically gets nauseated and occasionally vomits and feels lightheaded at the early onset of UTI. He started feeling this way yesterday. Additionally he notes dark foul-smelling urine. He denies any significant dysuria or urinary frequency or urgency. He denies any abdominal pain diarrhea fevers or chills.  He is a medical history significant for severe life-threatening subdural hematoma in 1994. He is medically disabled from this.  Patient has an allergy to amoxicillin however he he has successfully and safely taken cephalosporins in the past including Keflex. Past Medical History  Diagnosis Date  . Sleep apnea   . GERD (gastroesophageal reflux disease)   . Depression   . Blood dyscrasia     patient on chronic coumadin  . Kidney calculus   . SDH (subdural hematoma)   . DVT (deep venous thrombosis)    History  Substance Use Topics  . Smoking status: Former Smoker -- 1.00 packs/day    Quit date: 01/01/2001  . Smokeless tobacco: Not on file  . Alcohol Use: No   ROS as above Medications reviewed. No current facility-administered medications for this encounter.   Current Outpatient Prescriptions  Medication Sig Dispense Refill  . cephALEXin (KEFLEX) 500 MG capsule Take 1 capsule (500 mg total) by mouth 4 (four) times daily.  28 capsule  0  . mirtazapine (REMERON) 30 MG tablet Take 30 mg by mouth at bedtime.      . naproxen sodium (ANAPROX) 220 MG tablet Take 220 mg by mouth 2 (two) times daily as needed. Pain        . Omega-3 Fatty Acids (FISH OIL) 1200 MG CAPS Take 1 capsule by mouth 2 (two) times daily.        . ondansetron (ZOFRAN ODT) 8 MG disintegrating tablet Take 1 tablet (8 mg total) by mouth every 8 (eight) hours as needed for nausea.  20 tablet  0  . sertraline (ZOLOFT) 50 MG tablet Take 50 mg by mouth daily after breakfast.         . simvastatin (ZOCOR) 20 MG tablet Take 20 mg by mouth at bedtime.        Marland Kitchen warfarin (COUMADIN) 5 MG tablet Take 5-7.5 mg by mouth daily. 1.5 tablet Tuesday, Thursday, Saturday. 1 tablet all other days        Exam:  BP 116/76  Pulse 62  Temp(Src) 98.1 F (36.7 C) (Oral)  Resp 16  SpO2 94% Gen: Well NAD HEENT:  MMM Lungs: Normal work of breathing. CTABL Heart: RRR no MRG Abd: NABS, Soft. NT, ND Exts: warm and well perfused.   Results for orders placed during the hospital encounter of 12/09/12 (from the past 24 hour(s))  POCT URINALYSIS DIP (DEVICE)     Status: Abnormal   Collection Time    12/09/12 11:40 AM      Result Value Range   Glucose, UA NEGATIVE  NEGATIVE mg/dL   Bilirubin Urine NEGATIVE  NEGATIVE   Ketones, ur NEGATIVE  NEGATIVE mg/dL   Specific Gravity, Urine 1.025  1.005 - 1.030   Hgb urine dipstick TRACE (*) NEGATIVE   pH 6.5  5.0 - 8.0   Protein, ur NEGATIVE  NEGATIVE mg/dL   Urobilinogen, UA 1.0  0.0 - 1.0 mg/dL   Nitrite NEGATIVE  NEGATIVE   Leukocytes, UA TRACE (*) NEGATIVE   No results found.  Assessment and Plan:  62 y.o. male with probable UTI. Urine culture pending. Plan to treat with Keflex. Followup with urology if not improved. Discussed warning signs or symptoms. Please see discharge instructions. Patient expresses understanding.      Rodolph Bong, MD 12/09/12 [phone]

## 2012-12-10 LAB — URINE CULTURE: Colony Count: 30000

## 2013-02-04 DIAGNOSIS — R141 Gas pain: Secondary | ICD-10-CM | POA: Diagnosis not present

## 2013-02-04 DIAGNOSIS — R197 Diarrhea, unspecified: Secondary | ICD-10-CM | POA: Diagnosis not present

## 2013-02-04 DIAGNOSIS — R143 Flatulence: Secondary | ICD-10-CM | POA: Diagnosis not present

## 2013-03-07 DIAGNOSIS — N390 Urinary tract infection, site not specified: Secondary | ICD-10-CM | POA: Diagnosis not present

## 2013-03-07 DIAGNOSIS — R1011 Right upper quadrant pain: Secondary | ICD-10-CM | POA: Diagnosis not present

## 2013-03-08 ENCOUNTER — Other Ambulatory Visit: Payer: Self-pay | Admitting: Gastroenterology

## 2013-03-08 DIAGNOSIS — R1011 Right upper quadrant pain: Secondary | ICD-10-CM

## 2013-03-08 DIAGNOSIS — R197 Diarrhea, unspecified: Secondary | ICD-10-CM | POA: Diagnosis not present

## 2013-03-09 ENCOUNTER — Encounter (HOSPITAL_COMMUNITY): Payer: 59 | Admitting: Certified Registered"

## 2013-03-09 ENCOUNTER — Encounter (HOSPITAL_COMMUNITY): Payer: Self-pay | Admitting: Emergency Medicine

## 2013-03-09 ENCOUNTER — Encounter (HOSPITAL_COMMUNITY)
Admission: EM | Disposition: A | Discharge status: Home / Self Care | Payer: Self-pay | Source: Home / Self Care | Attending: Orthopedic Surgery

## 2013-03-09 ENCOUNTER — Ambulatory Visit (HOSPITAL_COMMUNITY)
Admission: EM | Admit: 2013-03-09 | Discharge: 2013-03-11 | Disposition: A | Discharge status: Home / Self Care | Payer: 59 | Attending: Orthopedic Surgery | Admitting: Orthopedic Surgery

## 2013-03-09 ENCOUNTER — Emergency Department (HOSPITAL_COMMUNITY): Payer: 59

## 2013-03-09 ENCOUNTER — Emergency Department (HOSPITAL_COMMUNITY): Payer: 59 | Admitting: Certified Registered"

## 2013-03-09 DIAGNOSIS — I6789 Other cerebrovascular disease: Secondary | ICD-10-CM | POA: Insufficient documentation

## 2013-03-09 DIAGNOSIS — G319 Degenerative disease of nervous system, unspecified: Secondary | ICD-10-CM | POA: Diagnosis not present

## 2013-03-09 DIAGNOSIS — Z87891 Personal history of nicotine dependence: Secondary | ICD-10-CM | POA: Diagnosis not present

## 2013-03-09 DIAGNOSIS — G4730 Sleep apnea, unspecified: Secondary | ICD-10-CM | POA: Diagnosis not present

## 2013-03-09 DIAGNOSIS — W108XXA Fall (on) (from) other stairs and steps, initial encounter: Secondary | ICD-10-CM | POA: Diagnosis not present

## 2013-03-09 DIAGNOSIS — F329 Major depressive disorder, single episode, unspecified: Secondary | ICD-10-CM | POA: Diagnosis not present

## 2013-03-09 DIAGNOSIS — Z87820 Personal history of traumatic brain injury: Secondary | ICD-10-CM | POA: Insufficient documentation

## 2013-03-09 DIAGNOSIS — M25571 Pain in right ankle and joints of right foot: Secondary | ICD-10-CM

## 2013-03-09 DIAGNOSIS — E669 Obesity, unspecified: Secondary | ICD-10-CM | POA: Insufficient documentation

## 2013-03-09 DIAGNOSIS — Z86711 Personal history of pulmonary embolism: Secondary | ICD-10-CM | POA: Insufficient documentation

## 2013-03-09 DIAGNOSIS — S82891A Other fracture of right lower leg, initial encounter for closed fracture: Secondary | ICD-10-CM | POA: Diagnosis present

## 2013-03-09 DIAGNOSIS — S82853A Displaced trimalleolar fracture of unspecified lower leg, initial encounter for closed fracture: Principal | ICD-10-CM

## 2013-03-09 DIAGNOSIS — F3289 Other specified depressive episodes: Secondary | ICD-10-CM | POA: Insufficient documentation

## 2013-03-09 DIAGNOSIS — Z7901 Long term (current) use of anticoagulants: Secondary | ICD-10-CM | POA: Insufficient documentation

## 2013-03-09 DIAGNOSIS — K219 Gastro-esophageal reflux disease without esophagitis: Secondary | ICD-10-CM | POA: Diagnosis not present

## 2013-03-09 DIAGNOSIS — W19XXXA Unspecified fall, initial encounter: Secondary | ICD-10-CM | POA: Diagnosis not present

## 2013-03-09 DIAGNOSIS — Z86718 Personal history of other venous thrombosis and embolism: Secondary | ICD-10-CM | POA: Diagnosis not present

## 2013-03-09 DIAGNOSIS — Z6830 Body mass index (BMI) 30.0-30.9, adult: Secondary | ICD-10-CM | POA: Insufficient documentation

## 2013-03-09 DIAGNOSIS — G8918 Other acute postprocedural pain: Secondary | ICD-10-CM | POA: Diagnosis not present

## 2013-03-09 HISTORY — PX: ORIF ANKLE FRACTURE: SHX5408

## 2013-03-09 HISTORY — DX: Pneumonia, unspecified organism: J18.9

## 2013-03-09 HISTORY — DX: Other pulmonary embolism without acute cor pulmonale: I26.99

## 2013-03-09 HISTORY — DX: Urinary tract infection, site not specified: N39.0

## 2013-03-09 LAB — I-STAT CHEM 8, ED
BUN: 13 mg/dL (ref 6–23)
Calcium, Ion: 1.53 mmol/L — ABNORMAL HIGH (ref 1.13–1.30)
Chloride: 106 mEq/L (ref 96–112)
Creatinine, Ser: 1.2 mg/dL (ref 0.50–1.35)
Glucose, Bld: 111 mg/dL — ABNORMAL HIGH (ref 70–99)
HCT: 45 % (ref 39.0–52.0)
Hemoglobin: 15.3 g/dL (ref 13.0–17.0)
Potassium: 3.7 mEq/L (ref 3.7–5.3)
Sodium: 145 mEq/L (ref 137–147)
TCO2: 24 mmol/L (ref 0–100)

## 2013-03-09 LAB — URINALYSIS, ROUTINE W REFLEX MICROSCOPIC
Bilirubin Urine: NEGATIVE
Glucose, UA: NEGATIVE mg/dL
Ketones, ur: NEGATIVE mg/dL
Nitrite: POSITIVE — AB
Protein, ur: 30 mg/dL — AB
Specific Gravity, Urine: 1.017 (ref 1.005–1.030)
Urobilinogen, UA: 0.2 mg/dL (ref 0.0–1.0)
pH: 7.5 (ref 5.0–8.0)

## 2013-03-09 LAB — CBC
HCT: 44.9 % (ref 39.0–52.0)
Hemoglobin: 15.9 g/dL (ref 13.0–17.0)
MCH: 30.2 pg (ref 26.0–34.0)
MCHC: 35.4 g/dL (ref 30.0–36.0)
MCV: 85.4 fL (ref 78.0–100.0)
Platelets: 157 10*3/uL (ref 150–400)
RBC: 5.26 MIL/uL (ref 4.22–5.81)
RDW: 14.3 % (ref 11.5–15.5)
WBC: 8 10*3/uL (ref 4.0–10.5)

## 2013-03-09 LAB — URINE MICROSCOPIC-ADD ON

## 2013-03-09 LAB — PROTIME-INR
INR: 1.93 — ABNORMAL HIGH (ref 0.00–1.49)
Prothrombin Time: 21.5 seconds — ABNORMAL HIGH (ref 11.6–15.2)

## 2013-03-09 SURGERY — OPEN REDUCTION INTERNAL FIXATION (ORIF) ANKLE FRACTURE
Anesthesia: General | Site: Ankle | Laterality: Right

## 2013-03-09 MED ORDER — MIDAZOLAM HCL 5 MG/5ML IJ SOLN
INTRAMUSCULAR | Status: DC | PRN
  Administered 2013-03-09: 1 mg via INTRAVENOUS

## 2013-03-09 MED ORDER — PROPOFOL 10 MG/ML IV BOLUS
INTRAVENOUS | Status: AC
  Filled 2013-03-09: qty 20

## 2013-03-09 MED ORDER — WARFARIN - PHYSICIAN DOSING INPATIENT
Freq: Every day | Status: DC
  Administered 2013-03-10: 18:00:00

## 2013-03-09 MED ORDER — OXYCODONE-ACETAMINOPHEN 5-325 MG PO TABS
1.0000 | ORAL_TABLET | ORAL | Status: DC | PRN
  Administered 2013-03-10 – 2013-03-11 (×3): 1 via ORAL
  Filled 2013-03-09 (×3): qty 1

## 2013-03-09 MED ORDER — ONDANSETRON HCL 4 MG PO TABS: 4.0000 mg | ORAL_TABLET | Freq: Four times a day (QID) | ORAL | Status: DC | PRN

## 2013-03-09 MED ORDER — BUPIVACAINE HCL 0.5 % IJ SOLN
INTRAMUSCULAR | Status: DC | PRN
  Administered 2013-03-09: 10 mL

## 2013-03-09 MED ORDER — WARFARIN SODIUM 5 MG PO TABS
5.0000 mg | ORAL_TABLET | ORAL | Status: DC
  Administered 2013-03-10: 5 mg via ORAL
  Filled 2013-03-09 (×2): qty 1

## 2013-03-09 MED ORDER — ONDANSETRON HCL 4 MG/2ML IJ SOLN
INTRAMUSCULAR | Status: DC | PRN
  Administered 2013-03-09: 4 mg via INTRAVENOUS

## 2013-03-09 MED ORDER — SODIUM CHLORIDE 0.9 % IV SOLN
INTRAVENOUS | Status: DC
  Administered 2013-03-10: 06:00:00 via INTRAVENOUS

## 2013-03-09 MED ORDER — CIPROFLOXACIN IN D5W 400 MG/200ML IV SOLN
400.0000 mg | Freq: Once | INTRAVENOUS | Status: AC
  Administered 2013-03-09: 400 mg via INTRAVENOUS
  Filled 2013-03-09: qty 200

## 2013-03-09 MED ORDER — WARFARIN SODIUM 7.5 MG PO TABS
7.5000 mg | ORAL_TABLET | ORAL | Status: DC
  Administered 2013-03-10: 7.5 mg via ORAL
  Filled 2013-03-09: qty 1

## 2013-03-09 MED ORDER — HYDROMORPHONE HCL PF 1 MG/ML IJ SOLN: 0.2500 mg | INTRAMUSCULAR | Status: DC | PRN

## 2013-03-09 MED ORDER — MIRTAZAPINE 30 MG PO TABS
30.0000 mg | ORAL_TABLET | Freq: Every day | ORAL | Status: DC
  Administered 2013-03-10 (×2): 30 mg via ORAL
  Filled 2013-03-09 (×3): qty 1

## 2013-03-09 MED ORDER — BUPIVACAINE HCL (PF) 0.5 % IJ SOLN
INTRAMUSCULAR | Status: AC
  Filled 2013-03-09: qty 10

## 2013-03-09 MED ORDER — CEFAZOLIN SODIUM-DEXTROSE 2-3 GM-% IV SOLR
2.0000 g | Freq: Four times a day (QID) | INTRAVENOUS | Status: AC
  Administered 2013-03-10 (×3): 2 g via INTRAVENOUS
  Filled 2013-03-09 (×3): qty 50

## 2013-03-09 MED ORDER — SIMVASTATIN 20 MG PO TABS
20.0000 mg | ORAL_TABLET | Freq: Every day | ORAL | Status: DC
  Administered 2013-03-10 (×2): 20 mg via ORAL
  Filled 2013-03-09 (×3): qty 1

## 2013-03-09 MED ORDER — SERTRALINE HCL 50 MG PO TABS
50.0000 mg | ORAL_TABLET | Freq: Every day | ORAL | Status: DC
  Administered 2013-03-10 – 2013-03-11 (×2): 50 mg via ORAL
  Filled 2013-03-09 (×3): qty 1

## 2013-03-09 MED ORDER — ONDANSETRON HCL 4 MG/2ML IJ SOLN: 4.0000 mg | Freq: Once | INTRAMUSCULAR | Status: DC | PRN

## 2013-03-09 MED ORDER — ONDANSETRON HCL 4 MG/2ML IJ SOLN: 4.0000 mg | Freq: Four times a day (QID) | INTRAMUSCULAR | Status: DC | PRN

## 2013-03-09 MED ORDER — MORPHINE SULFATE 4 MG/ML IJ SOLN
4.0000 mg | Freq: Once | INTRAMUSCULAR | Status: AC
  Administered 2013-03-09: 4 mg via INTRAVENOUS
  Filled 2013-03-09: qty 1

## 2013-03-09 MED ORDER — LIDOCAINE HCL (CARDIAC) 20 MG/ML IV SOLN
INTRAVENOUS | Status: DC | PRN
  Administered 2013-03-09: 50 mg via INTRAVENOUS

## 2013-03-09 MED ORDER — NEOSTIGMINE METHYLSULFATE 1 MG/ML IJ SOLN
INTRAMUSCULAR | Status: DC | PRN
  Administered 2013-03-09: 4 mg via INTRAVENOUS

## 2013-03-09 MED ORDER — CEFAZOLIN SODIUM-DEXTROSE 2-3 GM-% IV SOLR
INTRAVENOUS | Status: DC | PRN
  Administered 2013-03-09: 2 g via INTRAVENOUS

## 2013-03-09 MED ORDER — GLYCOPYRROLATE 0.2 MG/ML IJ SOLN
INTRAMUSCULAR | Status: DC | PRN
  Administered 2013-03-09: .7 mg via INTRAVENOUS

## 2013-03-09 MED ORDER — OXYCODONE HCL 5 MG PO TABS: 5.0000 mg | ORAL_TABLET | Freq: Once | ORAL | Status: DC | PRN

## 2013-03-09 MED ORDER — WARFARIN SODIUM 5 MG PO TABS: 5.0000 mg | ORAL_TABLET | Freq: Every day | ORAL | Status: DC

## 2013-03-09 MED ORDER — LACTATED RINGERS IV SOLN
INTRAVENOUS | Status: DC | PRN
  Administered 2013-03-09 (×2): via INTRAVENOUS

## 2013-03-09 MED ORDER — FENTANYL CITRATE 0.05 MG/ML IJ SOLN
INTRAMUSCULAR | Status: DC | PRN
  Administered 2013-03-09: 50 ug via INTRAVENOUS

## 2013-03-09 MED ORDER — FENTANYL CITRATE 0.05 MG/ML IJ SOLN
INTRAMUSCULAR | Status: AC
  Filled 2013-03-09: qty 5

## 2013-03-09 MED ORDER — EPHEDRINE SULFATE 50 MG/ML IJ SOLN
INTRAMUSCULAR | Status: DC | PRN
  Administered 2013-03-09: 10 mg via INTRAVENOUS

## 2013-03-09 MED ORDER — MIDAZOLAM HCL 2 MG/2ML IJ SOLN
INTRAMUSCULAR | Status: AC
  Filled 2013-03-09: qty 2

## 2013-03-09 MED ORDER — HYDROMORPHONE HCL PF 1 MG/ML IJ SOLN: 1.0000 mg | INTRAMUSCULAR | Status: DC | PRN

## 2013-03-09 MED ORDER — OXYCODONE HCL 5 MG/5ML PO SOLN: 5.0000 mg | Freq: Once | ORAL | Status: DC | PRN

## 2013-03-09 MED ORDER — PROPOFOL 10 MG/ML IV BOLUS
INTRAVENOUS | Status: DC | PRN
  Administered 2013-03-09: 150 mg via INTRAVENOUS

## 2013-03-09 MED ORDER — OXYCODONE-ACETAMINOPHEN 5-325 MG PO TABS: 1.0000 | ORAL_TABLET | Freq: Four times a day (QID) | ORAL | Status: DC | PRN

## 2013-03-09 MED ORDER — ROCURONIUM BROMIDE 100 MG/10ML IV SOLN
INTRAVENOUS | Status: DC | PRN
  Administered 2013-03-09: 40 mg via INTRAVENOUS

## 2013-03-09 SURGICAL SUPPLY — 56 items
BANDAGE ELASTIC 4 VELCRO ST LF (GAUZE/BANDAGES/DRESSINGS) ×1 IMPLANT
BANDAGE ELASTIC 6 VELCRO ST LF (GAUZE/BANDAGES/DRESSINGS) ×1 IMPLANT
BANDAGE ESMARK 6X9 LF (GAUZE/BANDAGES/DRESSINGS) ×1 IMPLANT
BIT DRILL 2.5X2.75 QC CALB (BIT) ×1 IMPLANT
BLADE SURG 10 STRL SS (BLADE) ×2 IMPLANT
BLADE SURG ROTATE 9660 (MISCELLANEOUS) IMPLANT
BNDG CMPR 9X6 STRL LF SNTH (GAUZE/BANDAGES/DRESSINGS) ×1
BNDG ESMARK 6X9 LF (GAUZE/BANDAGES/DRESSINGS) ×2
COVER MAYO STAND STRL (DRAPES) ×2 IMPLANT
COVER SURGICAL LIGHT HANDLE (MISCELLANEOUS) ×2 IMPLANT
CUFF TOURNIQUET SINGLE 34IN LL (TOURNIQUET CUFF) ×1 IMPLANT
CUFF TOURNIQUET SINGLE 44IN (TOURNIQUET CUFF) IMPLANT
DRAPE OEC MINIVIEW 54X84 (DRAPES) ×1 IMPLANT
DRAPE U-SHAPE 47X51 STRL (DRAPES) ×2 IMPLANT
DURAPREP 26ML APPLICATOR (WOUND CARE) ×2 IMPLANT
ELECT REM PT RETURN 9FT ADLT (ELECTROSURGICAL) ×2
ELECTRODE REM PT RTRN 9FT ADLT (ELECTROSURGICAL) ×1 IMPLANT
GAUZE XEROFORM 1X8 LF (GAUZE/BANDAGES/DRESSINGS) ×1 IMPLANT
GLOVE BIOGEL PI IND STRL 8 (GLOVE) ×2 IMPLANT
GLOVE BIOGEL PI INDICATOR 8 (GLOVE) ×2
GLOVE ECLIPSE 7.5 STRL STRAW (GLOVE) ×4 IMPLANT
GOWN PREVENTION PLUS XLARGE (GOWN DISPOSABLE) ×2 IMPLANT
GOWN SRG XL XLNG 56XLVL 4 (GOWN DISPOSABLE) ×1 IMPLANT
GOWN STRL NON-REIN LRG LVL3 (GOWN DISPOSABLE) ×2 IMPLANT
GOWN STRL NON-REIN XL XLG LVL4 (GOWN DISPOSABLE)
KIT BASIN OR (CUSTOM PROCEDURE TRAY) ×2 IMPLANT
KIT ROOM TURNOVER OR (KITS) ×2 IMPLANT
MANIFOLD NEPTUNE II (INSTRUMENTS) ×1 IMPLANT
NDL HYPO 25GX1X1/2 BEV (NEEDLE) IMPLANT
NEEDLE HYPO 25GX1X1/2 BEV (NEEDLE) ×2 IMPLANT
NS IRRIG 1000ML POUR BTL (IV SOLUTION) ×2 IMPLANT
PACK ORTHO EXTREMITY (CUSTOM PROCEDURE TRAY) ×2 IMPLANT
PAD ARMBOARD 7.5X6 YLW CONV (MISCELLANEOUS) ×4 IMPLANT
PAD CAST 4YDX4 CTTN HI CHSV (CAST SUPPLIES) IMPLANT
PADDING CAST COTTON 4X4 STRL (CAST SUPPLIES) ×4
PLATE ACE 100DEG 7HOLE (Plate) ×1 IMPLANT
SCREW CORTICAL 3.5MM  12MM (Screw) ×1 IMPLANT
SCREW CORTICAL 3.5MM  16MM (Screw) ×1 IMPLANT
SCREW CORTICAL 3.5MM 12MM (Screw) IMPLANT
SCREW CORTICAL 3.5MM 14MM (Screw) ×2 IMPLANT
SCREW CORTICAL 3.5MM 16MM (Screw) IMPLANT
SCREW NLOCK CANC HEX 4X18 (Screw) ×2 IMPLANT
SCREW NLOCK CANC HEX 4X18 FIB (Screw) IMPLANT
SPLINT FIBERGLASS 4X30 (CAST SUPPLIES) ×1 IMPLANT
SPONGE GAUZE 4X4 12PLY (GAUZE/BANDAGES/DRESSINGS) ×1 IMPLANT
SPONGE LAP 4X18 X RAY DECT (DISPOSABLE) ×2 IMPLANT
STAPLER VISISTAT 35W (STAPLE) IMPLANT
SUCTION FRAZIER TIP 10 FR DISP (SUCTIONS) ×2 IMPLANT
SUT ETHILON 4 0 PS 2 18 (SUTURE) ×1 IMPLANT
SUT VIC AB 0 CTB1 27 (SUTURE) ×1 IMPLANT
SUT VIC AB 2-0 FS1 27 (SUTURE) ×1 IMPLANT
SYR CONTROL 10ML LL (SYRINGE) ×1 IMPLANT
TOWEL OR 17X24 6PK STRL BLUE (TOWEL DISPOSABLE) ×2 IMPLANT
TOWEL OR 17X26 10 PK STRL BLUE (TOWEL DISPOSABLE) ×2 IMPLANT
TUBE CONNECTING 12X1/4 (SUCTIONS) ×2 IMPLANT
WATER STERILE IRR 1000ML POUR (IV SOLUTION) ×2 IMPLANT

## 2013-03-09 NOTE — Brief Op Note (Signed)
03/09/2013  9:29 PM  PATIENT:  [name]  63 y.o. male  PRE-OPERATIVE DIAGNOSIS:  Right Ankle Fx  POST-OPERATIVE DIAGNOSIS:  * No post-op diagnosis entered *  PROCEDURE:  Procedure(s): OPEN REDUCTION INTERNAL FIXATION (ORIF) ANKLE FRACTURE (Right)  SURGEON:  Surgeon(s) and Role:    * Alta Corning, MD - Primary  PHYSICIAN ASSISTANT:   ASSISTANTS: bethune   ANESTHESIA:   general  EBL:     BLOOD ADMINISTERED:none  DRAINS: none   LOCAL MEDICATIONS USED:  NONE  SPECIMEN:  No Specimen  DISPOSITION OF SPECIMEN:  N/A  COUNTS:  YES  TOURNIQUET:    DICTATION: .Other Dictation: Dictation Number [id number]  PLAN OF CARE: Admit to inpatient   PATIENT DISPOSITION:  PACU - hemodynamically stable.   Delay start of Pharmacological VTE agent (>24hrs) due to surgical blood loss or risk of bleeding: no

## 2013-03-09 NOTE — Anesthesia Procedure Notes (Addendum)
Procedure Name: Intubation Date/Time: 03/09/2013 8:46 AM Performed by: Eligha Bridegroom Pre-anesthesia Checklist: Emergency Drugs available, Timeout performed, Patient identified, Suction available and Patient being monitored Patient Re-evaluated:Patient Re-evaluated prior to inductionOxygen Delivery Method: Circle system utilized Preoxygenation: Pre-oxygenation with 100% oxygen Intubation Type: IV induction Ventilation: Mask ventilation without difficulty and Oral airway inserted - appropriate to patient size Laryngoscope Size: 4 Tube size: 7.5 mm Airway Equipment and Method: Stylet Secured at: 22 cm Tube secured with: Tape Dental Injury: Teeth and Oropharynx as per pre-operative assessment    Anesthesia Regional Block:  Popliteal block  Pre-Anesthetic Checklist: ,, timeout performed, Correct Patient, Correct Site, Correct Laterality, Correct Procedure, Correct Position, site marked, Risks and benefits discussed,  Surgical consent,  Pre-op evaluation,  At surgeon's request and post-op pain management  Laterality: Right  Prep: chloraprep       Needles:      Needle Length: 9cm 9 cm Needle Gauge: 22 and 22 G    Additional Needles:  Procedures: ultrasound guided (picture in chart) and nerve stimulator Popliteal block Narrative:  Start time: 03/09/2013 8:00 PM End time: 03/09/2013 8:05 PM Injection made incrementally with aspirations every 5 mL.  Performed by: Personally   Additional Notes: 30 cc 0.5% marcaine 1:200 Epi with 8 mg decadron

## 2013-03-09 NOTE — ED Provider Notes (Signed)
CSN: [id number]     Arrival date & time 03/09/13  1643 History   First MD Initiated Contact with Patient 03/09/13 1654     Chief Complaint  Patient presents with  . Leg Pain      Patient is a 63 y.o. male presenting with leg pain and fall.  Leg Pain Associated symptoms: no fatigue, no fever and no neck pain   Fall This is a new problem. The current episode started today. The problem occurs rarely. The problem has been unchanged. Associated symptoms include joint swelling. Pertinent negatives include no abdominal pain, anorexia, change in bowel habit, chest pain, chills, congestion, coughing, fatigue, fever, headaches, myalgias, nausea, neck pain, numbness, rash, sore throat, swollen glands, urinary symptoms, vertigo, visual change, vomiting or weakness. Exacerbated by: palpation. He has tried nothing for the symptoms.    Past Medical History  Diagnosis Date  . Sleep apnea   . GERD (gastroesophageal reflux disease)   . Depression   . Blood dyscrasia     patient on chronic coumadin  . Kidney calculus   . SDH (subdural hematoma)   . DVT (deep venous thrombosis)    Past Surgical History  Procedure Laterality Date  . Brain surgery      head injury from tree limb   . Colonoscopy     Family History  Problem Relation Age of Onset  . Hypertension Mother   . Thyroid disease Mother    History  Substance Use Topics  . Smoking status: Former Smoker -- 1.00 packs/day    Quit date: 01/01/2001  . Smokeless tobacco: Not on file  . Alcohol Use: No    Review of Systems  Constitutional: Negative for fever, chills and fatigue.  HENT: Negative for congestion and sore throat.   Respiratory: Negative for cough.   Cardiovascular: Negative for chest pain.  Gastrointestinal: Negative for nausea, vomiting, abdominal pain, anorexia and change in bowel habit.  Musculoskeletal: Positive for joint swelling. Negative for myalgias and neck pain.       R ankle pain   Skin: Negative for rash.   Neurological: Negative for vertigo, weakness, numbness and headaches.      Allergies  Ampicillin and Macrodantin  Home Medications   Current Outpatient Rx  Name  Route  Sig  Dispense  Refill  . HYDROcodone-acetaminophen (NORCO/VICODIN) 5-325 MG per tablet   Oral   Take 1 tablet by mouth every 6 (six) hours as needed for moderate pain.         . mirtazapine (REMERON) 30 MG tablet   Oral   Take 30 mg by mouth at bedtime.         . naproxen sodium (ANAPROX) 220 MG tablet   Oral   Take 220 mg by mouth 2 (two) times daily as needed. Pain           . Omega-3 Fatty Acids (FISH OIL) 1200 MG CAPS   Oral   Take 1 capsule by mouth 2 (two) times daily.           . sertraline (ZOLOFT) 50 MG tablet   Oral   Take 50 mg by mouth daily after breakfast.           . simvastatin (ZOCOR) 20 MG tablet   Oral   Take 20 mg by mouth at bedtime.           Marland Kitchen warfarin (COUMADIN) 5 MG tablet   Oral   Take 5-7.5 mg by mouth daily. (7.5mg )1.5 tablet Tuesday,  Thursday, Saturday. (5mg ) tablet all other days          BP 116/57  Pulse 58  Temp(Src) 97.3 F (36.3 C) (Oral)  Resp 16  Ht 5\' 10"  (1.778 m)  Wt 210 lb (95.255 kg)  BMI 30.13 kg/m2  SpO2 100% Physical Exam  Constitutional: He is oriented to person, place, and time. He appears well-developed and well-nourished. No distress.  HENT:  Head: Normocephalic and atraumatic.  Nose: Nose normal.  Mouth/Throat: Oropharynx is clear and moist.  Eyes: Conjunctivae and EOM are normal. Pupils are equal, round, and reactive to light.  Neck: Normal range of motion. Neck supple. No tracheal deviation present.  Cardiovascular: Normal rate, regular rhythm, normal heart sounds and intact distal pulses.   Pulmonary/Chest: Effort normal and breath sounds normal. No respiratory distress. He has no rales.  Abdominal: Soft. Bowel sounds are normal. He exhibits no distension. There is no tenderness. There is no rebound and no guarding.   Musculoskeletal: He exhibits edema and tenderness.  R ankle swelling, TTP, deformity. Good cap refill, weak pulse, SILT. Able to move all toes on affected side   Neurological: He is alert and oriented to person, place, and time.  Skin: Skin is warm and dry.  Psychiatric: He has a normal mood and affect. His behavior is normal.    ED Course  Procedures (including critical care time) Labs Review Labs Reviewed  PROTIME-INR - Abnormal; Notable for the following:    Prothrombin Time 21.5 (*)    INR 1.93 (*)    All other components within normal limits  I-STAT CHEM 8, ED - Abnormal; Notable for the following:    Glucose, Bld 111 (*)    Calcium, Ion 1.53 (*)    All other components within normal limits  CBC  URINALYSIS, ROUTINE W REFLEX MICROSCOPIC   Imaging Review Dg Tibia/fibula Right  03/09/2013   CLINICAL DATA:  Fall.  Leg injury and pain.  EXAM: RIGHT TIBIA AND FIBULA - 2 VIEW  COMPARISON:  Right ankle radiographs also obtained today  FINDINGS: Trimalleolar ankle fracture with posterior dislocation again seen. No proximal tibial or fibular fractures are identified.  IMPRESSION: Trimalleolar ankle fracture-dislocation. No proximal tibia or fibula fractures.   Electronically Signed   By: Earle Gell M.D.   On: 03/09/2013 18:09   Dg Ankle Complete Right  03/09/2013   CLINICAL DATA:  Fall.  Severe ankle pain and deformity.  EXAM: RIGHT ANKLE - COMPLETE 3+ VIEW  COMPARISON:  None.  FINDINGS: A trimalleolar ankle fracture is seen with posterior dislocation of the talus.  IMPRESSION: Trimalleolar ankle fracture with posterior dislocation.   Electronically Signed   By: Earle Gell M.D.   On: 03/09/2013 18:07   Ct Head Wo Contrast  03/09/2013   CLINICAL DATA:  Status post fall.  EXAM: CT HEAD WITHOUT CONTRAST  TECHNIQUE: Contiguous axial images were obtained from the base of the skull through the vertex without intravenous contrast.  COMPARISON:  None.  FINDINGS: Right craniotomy defect is  identified. The brain is atrophic with some chronic microvascular ischemic change. No evidence of acute abnormality including infarction, hemorrhage, mass lesion, mass effect, midline shift or abnormal extra-axial fluid collection. There is no fracture. Imaged paranasal sinuses and mastoid air cells are clear.  IMPRESSION: No acute finding.  Atrophy and chronic microvascular ischemic change.  Status post right craniotomy.   Electronically Signed   By: Inge Rise M.D.   On: 03/09/2013 17:52     MDM  Final diagnoses:  Fall  Ankle pain, right  Trimalleolar fracture of ankle, closed   63 yo M with hx of previous SDH and recurrent PE and DVT on coumadin who presents after mechanical fall. Patient reports slipping on wet steps and twisting his R ankle. He fell onto his elbows but denies any elbow pain. No LOC. Denies HA, CP, SOB, Palpitations, ABD pain.  R foot is NV intact. Will obtain XR of ankle and tib fib. Will obtain Head Ct scan given coumadin use and fall. Morphine given for pain. Per wife pt was recently diagnosed with UTI. Will obtain UA. Case co managed with my attending Dr. Wyvonnia Dusky.   6:46 PM Discussed case with Dr. Berenice Primas who plans on taking patient to OR urgently. Will place in splint. Additional dose of morphine given. Patient and family up dated.   7:13 PM Patient taken to the OR in HDS condition. Cipro IV ordered for UTI.     Ruthell Rummage, MD 03/09/13 [phone]

## 2013-03-09 NOTE — Anesthesia Preprocedure Evaluation (Signed)
Anesthesia Evaluation  Patient identified by MRN, date of birth, ID band Patient awake    Reviewed: Allergy & Precautions, H&P   Airway Mallampati: II TM Distance: >3 FB Neck ROM: Full    Dental  (+) Teeth Intact, Dental Advisory Given   Pulmonary former smoker,  breath sounds clear to auscultation        Cardiovascular Rhythm:Regular Rate:Normal     Neuro/Psych    GI/Hepatic   Endo/Other    Renal/GU      Musculoskeletal   Abdominal (+) + obese,   Peds  Hematology   Anesthesia Other Findings   Reproductive/Obstetrics                           Anesthesia Physical Anesthesia Plan  ASA: III  Anesthesia Plan: General   Post-op Pain Management:    Induction: Intravenous  Airway Management Planned:   Additional Equipment:   Intra-op Plan:   Post-operative Plan: Extubation in OR  Informed Consent: I have reviewed the patients History and Physical, chart, labs and discussed the procedure including the risks, benefits and alternatives for the proposed anesthesia with the patient or authorized representative who has indicated his/her understanding and acceptance.   Dental advisory given  Plan Discussed with: CRNA and Anesthesiologist  Anesthesia Plan Comments:         Anesthesia Quick Evaluation

## 2013-03-09 NOTE — ED Notes (Signed)
Per pt sts he was stepping off the steps and slipped on wet concrete and now possible LE fracture.Obvious deformity. Weak pulse.

## 2013-03-09 NOTE — Preoperative (Signed)
Beta Blockers   Reason not to administer Beta Blockers:Not Applicable 

## 2013-03-09 NOTE — Progress Notes (Signed)
Orthopedic Tech Progress Note Patient Details:  Tyler Mason 06-29-1950 [id number] Patient ID: [name], male   DOB: December 25, 1950, 62 y.o.   MRN: [id number]   Braulio Bosch 03/09/2013, 7:28 PM Rn called and cancelled.

## 2013-03-09 NOTE — ED Notes (Signed)
Pt st's no relief from pain meds.  Continues to c/o pain 7-8.

## 2013-03-09 NOTE — H&P (Addendum)
Tyler Mason is an 63 y.o. male.  HPI: 63 yo male who fell earlier today.  Pt c/o r ankle pain and presented to ER with obvious deformity.  Pt has history of bleed intracranial and previous hisrtory of DVT and he takes coumadin chronically.Pt is admitted for ORIF and post op mobility training.  Past Medical History  Diagnosis Date  . Sleep apnea   . GERD (gastroesophageal reflux disease)   . Depression   . Blood dyscrasia     patient on chronic coumadin  . Kidney calculus   . SDH (subdural hematoma)   . DVT (deep venous thrombosis)     Past Surgical History  Procedure Laterality Date  . Brain surgery      head injury from tree limb   . Colonoscopy      Family History  Problem Relation Age of Onset  . Hypertension Mother   . Thyroid disease Mother     Social History:  reports that he quit smoking about 12 years ago. He does not have any smokeless tobacco history on file. He reports that he does not drink alcohol or use illicit drugs.  Allergies:  Allergies  Allergen Reactions  . Ampicillin Other (See Comments)    Made tongue sore  . Macrodantin [Nitrofurantoin Macrocrystal] Itching, Swelling and Rash    Medications: I have reviewed the patient's current medications.  Results for orders placed during the hospital encounter of 03/09/13 (from the past 48 hour(s))  PROTIME-INR     Status: Abnormal   Collection Time    03/09/13  5:06 PM      Result Value Ref Range   Prothrombin Time 21.5 (*) 11.6 - 15.2 seconds   INR 1.93 (*) 0.00 - 1.49  CBC     Status: None   Collection Time    03/09/13  5:06 PM      Result Value Ref Range   WBC 8.0  4.0 - 10.5 K/uL   RBC 5.26  4.22 - 5.81 MIL/uL   Hemoglobin 15.9  13.0 - 17.0 g/dL   HCT 44.9  39.0 - 52.0 %   MCV 85.4  78.0 - 100.0 fL   MCH 30.2  26.0 - 34.0 pg   MCHC 35.4  30.0 - 36.0 g/dL   RDW 14.3  11.5 - 15.5 %   Platelets 157  150 - 400 K/uL  I-STAT CHEM 8, ED     Status: Abnormal   Collection Time    03/09/13   5:27 PM      Result Value Ref Range   Sodium 145  137 - 147 mEq/L   Potassium 3.7  3.7 - 5.3 mEq/L   Chloride 106  96 - 112 mEq/L   BUN 13  6 - 23 mg/dL   Creatinine, Ser 1.20  0.50 - 1.35 mg/dL   Glucose, Bld 111 (*) 70 - 99 mg/dL   Calcium, Ion 1.53 (*) 1.13 - 1.30 mmol/L   TCO2 24  0 - 100 mmol/L   Hemoglobin 15.3  13.0 - 17.0 g/dL   HCT 45.0  39.0 - 52.0 %  URINALYSIS, ROUTINE W REFLEX MICROSCOPIC     Status: Abnormal   Collection Time    03/09/13  6:28 PM      Result Value Ref Range   Color, Urine YELLOW  YELLOW   APPearance CLOUDY (*) CLEAR   Specific Gravity, Urine 1.017  1.005 - 1.030   pH 7.5  5.0 - 8.0   Glucose,  UA NEGATIVE  NEGATIVE mg/dL   Hgb urine dipstick TRACE (*) NEGATIVE   Bilirubin Urine NEGATIVE  NEGATIVE   Ketones, ur NEGATIVE  NEGATIVE mg/dL   Protein, ur 30 (*) NEGATIVE mg/dL   Urobilinogen, UA 0.2  0.0 - 1.0 mg/dL   Nitrite POSITIVE (*) NEGATIVE   Leukocytes, UA MODERATE (*) NEGATIVE  URINE MICROSCOPIC-ADD ON     Status: Abnormal   Collection Time    03/09/13  6:28 PM      Result Value Ref Range   Squamous Epithelial / LPF FEW (*) RARE   WBC, UA 21-50  <3 WBC/hpf   RBC / HPF 0-2  <3 RBC/hpf   Bacteria, UA MANY (*) RARE   Casts HYALINE CASTS (*) NEGATIVE    Dg Tibia/fibula Right  03/09/2013   CLINICAL DATA:  Fall.  Leg injury and pain.  EXAM: RIGHT TIBIA AND FIBULA - 2 VIEW  COMPARISON:  Right ankle radiographs also obtained today  FINDINGS: Trimalleolar ankle fracture with posterior dislocation again seen. No proximal tibial or fibular fractures are identified.  IMPRESSION: Trimalleolar ankle fracture-dislocation. No proximal tibia or fibula fractures.   Electronically Signed   By: Earle Gell M.D.   On: 03/09/2013 18:09   Dg Ankle Complete Right  03/09/2013   CLINICAL DATA:  Fall.  Severe ankle pain and deformity.  EXAM: RIGHT ANKLE - COMPLETE 3+ VIEW  COMPARISON:  None.  FINDINGS: A trimalleolar ankle fracture is seen with posterior dislocation of  the talus.  IMPRESSION: Trimalleolar ankle fracture with posterior dislocation.   Electronically Signed   By: Earle Gell M.D.   On: 03/09/2013 18:07   Ct Head Wo Contrast  03/09/2013   CLINICAL DATA:  Status post fall.  EXAM: CT HEAD WITHOUT CONTRAST  TECHNIQUE: Contiguous axial images were obtained from the base of the skull through the vertex without intravenous contrast.  COMPARISON:  None.  FINDINGS: Right craniotomy defect is identified. The brain is atrophic with some chronic microvascular ischemic change. No evidence of acute abnormality including infarction, hemorrhage, mass lesion, mass effect, midline shift or abnormal extra-axial fluid collection. There is no fracture. Imaged paranasal sinuses and mastoid air cells are clear.  IMPRESSION: No acute finding.  Atrophy and chronic microvascular ischemic change.  Status post right craniotomy.   Electronically Signed   By: Inge Rise M.D.   On: 03/09/2013 17:52    ROS ROS: I have reviewed the patient's review of systems thoroughly and there are no positive responses as relates to the HPI. EXAM: Blood pressure 120/63, pulse 59, temperature 97.3 F (36.3 C), temperature source Oral, resp. rate 16, height 5\' 10"  (1.778 m), weight 210 lb (95.255 kg), SpO2 100.00%. Physical Exam Well-developed well-nourished patient in no acute distress. Alert and oriented x3 HEENT:within normal limits Cardiac: Regular rate and rhythm Pulmonary: Lungs clear to auscultation Abdomen: Soft and nontender.  Normal active bowel sounds  Musculoskeletal: R ankle+sts and pain on all rom Recent Results (from the past 2160 hour(s))  PROTIME-INR     Status: Abnormal   Collection Time    03/09/13  5:06 PM      Result Value Ref Range   Prothrombin Time 21.5 (*) 11.6 - 15.2 seconds   INR 1.93 (*) 0.00 - 1.49  CBC     Status: None   Collection Time    03/09/13  5:06 PM      Result Value Ref Range   WBC 8.0  4.0 - 10.5 K/uL   RBC  5.26  4.22 - 5.81 MIL/uL    Hemoglobin 15.9  13.0 - 17.0 g/dL   HCT 44.9  39.0 - 52.0 %   MCV 85.4  78.0 - 100.0 fL   MCH 30.2  26.0 - 34.0 pg   MCHC 35.4  30.0 - 36.0 g/dL   RDW 14.3  11.5 - 15.5 %   Platelets 157  150 - 400 K/uL  I-STAT CHEM 8, ED     Status: Abnormal   Collection Time    03/09/13  5:27 PM      Result Value Ref Range   Sodium 145  137 - 147 mEq/L   Potassium 3.7  3.7 - 5.3 mEq/L   Chloride 106  96 - 112 mEq/L   BUN 13  6 - 23 mg/dL   Creatinine, Ser 1.20  0.50 - 1.35 mg/dL   Glucose, Bld 111 (*) 70 - 99 mg/dL   Calcium, Ion 1.53 (*) 1.13 - 1.30 mmol/L   TCO2 24  0 - 100 mmol/L   Hemoglobin 15.3  13.0 - 17.0 g/dL   HCT 45.0  39.0 - 52.0 %  URINALYSIS, ROUTINE W REFLEX MICROSCOPIC     Status: Abnormal   Collection Time    03/09/13  6:28 PM      Result Value Ref Range   Color, Urine YELLOW  YELLOW   APPearance CLOUDY (*) CLEAR   Specific Gravity, Urine 1.017  1.005 - 1.030   pH 7.5  5.0 - 8.0   Glucose, UA NEGATIVE  NEGATIVE mg/dL   Hgb urine dipstick TRACE (*) NEGATIVE   Bilirubin Urine NEGATIVE  NEGATIVE   Ketones, ur NEGATIVE  NEGATIVE mg/dL   Protein, ur 30 (*) NEGATIVE mg/dL   Urobilinogen, UA 0.2  0.0 - 1.0 mg/dL   Nitrite POSITIVE (*) NEGATIVE   Leukocytes, UA MODERATE (*) NEGATIVE  URINE MICROSCOPIC-ADD ON     Status: Abnormal   Collection Time    03/09/13  6:28 PM      Result Value Ref Range   Squamous Epithelial / LPF FEW (*) RARE   WBC, UA 21-50  <3 WBC/hpf   RBC / HPF 0-2  <3 RBC/hpf   Bacteria, UA MANY (*) RARE   Casts HYALINE CASTS (*) NEGATIVE    Assessment/Plan: 63 yo male with post fx dislocation and tri mal fracture.  Pt will be taken emergently for ORIF and we will admit opost op for pain control and mobility training. Pt and family are aware of risk of bleeding , infection, risk of hardware failure and slight risk of death at surgery.  They all wish to proceed with surgical intervention.  Antoine Vandermeulen L 03/09/2013, 7:36 PM

## 2013-03-09 NOTE — Transfer of Care (Signed)
Immediate Anesthesia Transfer of Care Note  Patient: Tyler Mason  Procedure(s) Performed: Procedure(s): OPEN REDUCTION INTERNAL FIXATION (ORIF) ANKLE FRACTURE right (Right)  Patient Location: PACU  Anesthesia Type:General  Level of Consciousness: awake and alert   Airway & Oxygen Therapy: Patient Spontanous Breathing  Post-op Assessment: Report given to PACU RN and Post -op Vital signs reviewed and stable  Post vital signs: Reviewed and stable  Complications: No apparent anesthesia complications

## 2013-03-10 ENCOUNTER — Encounter (HOSPITAL_COMMUNITY): Payer: Self-pay | Admitting: General Practice

## 2013-03-10 DIAGNOSIS — S82853A Displaced trimalleolar fracture of unspecified lower leg, initial encounter for closed fracture: Secondary | ICD-10-CM | POA: Diagnosis not present

## 2013-03-10 LAB — PROTIME-INR
INR: 2.37 — ABNORMAL HIGH (ref 0.00–1.49)
Prothrombin Time: 25.1 seconds — ABNORMAL HIGH (ref 11.6–15.2)

## 2013-03-10 NOTE — Evaluation (Signed)
Physical Therapy Evaluation Patient Details Name: Tyler Mason MRN: [id number] DOB: 1950/04/06 Today's Date: 03/10/2013 Time: 4580-9983 PT Time Calculation (min): 48 min  PT Assessment / Plan / Recommendation History of Present Illness  pt presents with R ankle fx s/p ORIF.    Clinical Impression  Anticipate pt will make great progress.  Mildly unsteady and needs A for balance.  Wife to check if family has DME pt can borrow, otherwise will need DME listed below.  Pt would benefit from HHPT for home safety eval, however pt's wife is RN and states she wasn't sure if they would need it.  Will f/u to attempt stairs.      PT Assessment  Patient needs continued PT services    Follow Up Recommendations  Home health PT;Supervision/Assistance - 24 hour    Does the patient have the potential to tolerate intense rehabilitation      Barriers to Discharge        Equipment Recommendations  Rolling walker with 5" wheels;3in1 (PT) Lobbyist.  )    Recommendations for Other Services     Frequency Min 5X/week    Precautions / Restrictions Precautions Precautions: Fall Restrictions Weight Bearing Restrictions: Yes RLE Weight Bearing: Non weight bearing   Pertinent Vitals/Pain Pt indicates no pain, still numb from nerve block.        Mobility  Bed Mobility Overal bed mobility: Modified Independent General bed mobility comments: Needs increased time, but able to complete without A.   Transfers Overall transfer level: Needs assistance Equipment used: Rolling walker (2 wheeled) Transfers: Sit to/from Stand Sit to Stand: Min guard General transfer comment: cues for UE use and to slow down as pt tends to rush.   Ambulation/Gait Ambulation/Gait assistance: Min guard Ambulation Distance (Feet): 15 Feet Assistive device: Rolling walker (2 wheeled) Gait Pattern/deviations: Step-to pattern General Gait Details: cues for safe use of RW, slowing down, maintaining NWBing in R LE.  pt mildly  unsteady especially when rushing.      Exercises     PT Diagnosis: Difficulty walking  PT Problem List: Decreased strength;Decreased activity tolerance;Decreased balance;Decreased mobility;Decreased coordination;Decreased knowledge of use of DME;Decreased knowledge of precautions;Impaired sensation PT Treatment Interventions: DME instruction;Gait training;Stair training;Functional mobility training;Therapeutic activities;Therapeutic exercise;Balance training;Patient/family education     PT Goals(Current goals can be found in the care plan section) Acute Rehab PT Goals Patient Stated Goal: Home PT Goal Formulation: With patient/family Time For Goal Achievement: 03/24/13 Potential to Achieve Goals: Good  Visit Information  Last PT Received On: 03/10/13 Assistance Needed: +1 History of Present Illness: pt presents with R ankle fx s/p ORIF.         Prior [place] expects to be discharged to:: Private residence Living Arrangements: Spouse/significant other Available Help at Discharge: Family;Available 24 hours/day Type of Home: House Home Access: Stairs to enter [employer] of Steps: 3 Entrance Stairs-Rails: None Home Layout: One level Home Equipment: None Additional Comments: pt's wife is RN here and plans to take FMLA time to A with care after d/c.   Prior Function Level of Independence: Independent Communication Communication: No difficulties    Cognition  Cognition Arousal/Alertness: Awake/alert Behavior During Therapy: WFL for tasks assessed/performed Overall Cognitive Status: Within Functional Limits for tasks assessed    Extremity/Trunk Assessment Upper Extremity Assessment Upper Extremity Assessment: Overall WFL for tasks assessed Lower Extremity Assessment Lower Extremity Assessment: RLE deficits/detail RLE Deficits / Details: knee/hip WFL, ankle/foot casted.  pt indicates still has numbness in LE  from nerve block.   RLE  Sensation: decreased light touch Cervical / Trunk Assessment Cervical / Trunk Assessment: Normal   Balance Balance Overall balance assessment: Needs assistance Standing balance support: Bilateral upper extremity supported Standing balance-Leahy Scale: Poor Standing balance comment: pt needs UE support to maintain balance.    End of Session PT - End of Session Equipment Utilized During Treatment: Gait belt Activity Tolerance: Patient tolerated treatment well Patient left: in chair;with call bell/phone within reach;with family/visitor present Nurse Communication: Mobility status  GP Functional Assessment Tool Used: Clinical Judgement Functional Limitation: Mobility: Walking and moving around Mobility: Walking and Moving Around Current Status (D7412): At least 1 percent but less than 20 percent impaired, limited or restricted Mobility: Walking and Moving Around Goal Status [phone]): 0 percent impaired, limited or restricted   Catarina Hartshorn, [place] 03/10/2013, 10:34 AM

## 2013-03-10 NOTE — Discharge Summary (Addendum)
Patient ID: TREON KEHL MRN: [id number] DOB/AGE: 1950/06/20 63 y.o.  Admit date: 03/09/2013 Discharge date: 03/11/2013  Admission Diagnoses:  Principal Problem:   Fracture dislocation of right ankle joint   Discharge Diagnoses:  Same  Past Medical History  Diagnosis Date  . GERD (gastroesophageal reflux disease)   . Depression   . Blood dyscrasia     patient on chronic coumadin  . Kidney calculus   . SDH (subdural hematoma)   . DVT (deep venous thrombosis)   . Sleep apnea   . Pneumonia     HX OF PNA  . PE (pulmonary embolism)     HX  OF PE  . Headache(784.0)   . UTI (urinary tract infection)     FREQUENT    Surgeries: Procedure(s): OPEN REDUCTION INTERNAL FIXATION (ORIF) ANKLE FRACTURE right on 03/09/2013   Discharged Condition: Improved  Hospital Course: OSWALD POTT is an 63 y.o. male who was admitted 03/09/2013 for operative treatment ofFracture dislocation of right ankle joint. Patient has severe unremitting pain that affects sleep, daily activities, and work/hobbies. After pre-op clearance the patient was taken to the operating room on 03/09/2013 and underwent  Procedure(s): OPEN REDUCTION INTERNAL FIXATION (ORIF) ANKLE FRACTURE right.    Patient was given perioperative antibiotics:     Anti-infectives   Start     Dose/Rate Route Frequency Ordered Stop   03/10/13 0230  ceFAZolin (ANCEF) IVPB 2 g/50 mL premix     2 g 100 mL/hr over 30 Minutes Intravenous Every 6 hours 03/09/13 2315 03/10/13 1555   03/09/13 1930  [MAR Hold]  ciprofloxacin (CIPRO) IVPB 400 mg     (On MAR Hold since 03/09/13 1936)   400 mg 200 mL/hr over 60 Minutes Intravenous  Once 03/09/13 1915 03/09/13 2020       Patient was given sequential compression devices, early ambulation, and chemoprophylaxis to prevent DVT.Pt needed extra day in hosp for PT safety in ambulating.  Patient benefited maximally from hospital stay and there were no complications.    Recent vital signs:  Patient Vitals for  the past 24 hrs:  BP Temp Temp src Pulse Resp SpO2  03/11/13 0502 117/64 mmHg 97.8 F (36.6 C) - 63 18 93 %  03/10/13 2100 127/67 mmHg 98 F (36.7 C) - 85 16 93 %  03/10/13 1329 133/68 mmHg 97.9 F (36.6 C) Oral 98 18 94 %     Recent laboratory studies:   Recent Labs  03/09/13 1706 03/09/13 1727 03/10/13 0502 03/11/13 0516  WBC 8.0  --   --   --   HGB 15.9 15.3  --   --   HCT 44.9 45.0  --   --   PLT 157  --   --   --   NA  --  145  --   --   K  --  3.7  --   --   CL  --  106  --   --   BUN  --  13  --   --   CREATININE  --  1.20  --   --   GLUCOSE  --  111*  --   --   INR 1.93*  --  2.37* 3.36*     Discharge Medications:     Medication List    STOP taking these medications       HYDROcodone-acetaminophen 5-325 MG per tablet  Commonly known as:  NORCO/VICODIN      TAKE these medications  Fish Oil 1200 MG Caps  Take 1 capsule by mouth 2 (two) times daily.     mirtazapine 30 MG tablet  Commonly known as:  REMERON  Take 30 mg by mouth at bedtime.     naproxen sodium 220 MG tablet  Commonly known as:  ANAPROX  - Take 220 mg by mouth 2 (two) times daily as needed. Pain  -      oxyCODONE-acetaminophen 5-325 MG per tablet  Commonly known as:  PERCOCET/ROXICET  Take 1-2 tablets by mouth every 6 (six) hours as needed for severe pain.     sertraline 50 MG tablet  Commonly known as:  ZOLOFT  Take 50 mg by mouth daily after breakfast.     simvastatin 20 MG tablet  Commonly known as:  ZOCOR  Take 20 mg by mouth at bedtime.     warfarin 5 MG tablet  Commonly known as:  COUMADIN  - Take 5-7.5 mg by mouth daily. (7.5mg )1.5 tablet Tuesday, Thursday, Saturday.  - (5mg ) tablet all other days        Diagnostic Studies: Dg Tibia/fibula Right  03/09/2013   CLINICAL DATA:  Fall.  Leg injury and pain.  EXAM: RIGHT TIBIA AND FIBULA - 2 VIEW  COMPARISON:  Right ankle radiographs also obtained today  FINDINGS: Trimalleolar ankle fracture with posterior  dislocation again seen. No proximal tibial or fibular fractures are identified.  IMPRESSION: Trimalleolar ankle fracture-dislocation. No proximal tibia or fibula fractures.   Electronically Signed   By: Earle Gell M.D.   On: 03/09/2013 18:09   Dg Ankle Complete Right  03/09/2013   CLINICAL DATA:  Fall.  Severe ankle pain and deformity.  EXAM: RIGHT ANKLE - COMPLETE 3+ VIEW  COMPARISON:  None.  FINDINGS: A trimalleolar ankle fracture is seen with posterior dislocation of the talus.  IMPRESSION: Trimalleolar ankle fracture with posterior dislocation.   Electronically Signed   By: Earle Gell M.D.   On: 03/09/2013 18:07   Ct Head Wo Contrast  03/09/2013   CLINICAL DATA:  Status post fall.  EXAM: CT HEAD WITHOUT CONTRAST  TECHNIQUE: Contiguous axial images were obtained from the base of the skull through the vertex without intravenous contrast.  COMPARISON:  None.  FINDINGS: Right craniotomy defect is identified. The brain is atrophic with some chronic microvascular ischemic change. No evidence of acute abnormality including infarction, hemorrhage, mass lesion, mass effect, midline shift or abnormal extra-axial fluid collection. There is no fracture. Imaged paranasal sinuses and mastoid air cells are clear.  IMPRESSION: No acute finding.  Atrophy and chronic microvascular ischemic change.  Status post right craniotomy.   Electronically Signed   By: Inge Rise M.D.   On: 03/09/2013 17:52    Disposition: 01-Home or Self Care  Discharge Orders   Future Appointments Provider Department Dept Phone   03/14/2013 8:00 AM [facility] AT [facility] [phone]   Please do not eat or drink anything after midnight or at least 6-8 hours prior to your scheduled exam. Any medications can be taken as usual with minimal water. Please arrive 15 min prior to your appointment time.   Future Orders Complete By Expires   Call MD / Call 911  As directed    Comments:     If you experience  chest pain or shortness of breath, CALL 911 and be transported to the hospital emergency room.  If you develope a fever above 101 F, pus (white drainage) or increased drainage or redness  at the wound, or calf pain, call your surgeon's office.   Constipation Prevention  As directed    Comments:     Drink plenty of fluids.  Prune juice may be helpful.  You may use a stool softener, such as Colace (over the counter) 100 mg twice a day.  Use MiraLax (over the counter) for constipation as needed.   Diet general  As directed    Increase activity slowly as tolerated  As directed    Non weight bearing  As directed    Questions:     Laterality:     Extremity:        Follow-up Information   Follow up with GRAVES,JOHN L, MD. Schedule an appointment as soon as possible for a visit in 2 weeks.   Specialty:  Orthopedic Surgery   Contact information:   [place] [address] [phone]        Signed: Erlene Senters 03/11/2013, 12:29 PM

## 2013-03-10 NOTE — Anesthesia Postprocedure Evaluation (Signed)
  Anesthesia Post-op Note  Patient: Tyler Mason  Procedure(s) Performed: Procedure(s): OPEN REDUCTION INTERNAL FIXATION (ORIF) ANKLE FRACTURE right (Right)  Patient Location: PACU  Anesthesia Type:General and GA combined with regional for post-op pain  Level of Consciousness: awake, alert  and oriented  Airway and Oxygen Therapy: Patient Spontanous Breathing and Patient connected to nasal cannula oxygen  Post-op Pain: none  Post-op Assessment: Post-op Vital signs reviewed, Patient's Cardiovascular Status Stable, Respiratory Function Stable, Patent Airway and Pain level controlled  Post-op Vital Signs: stable  Complications: No apparent anesthesia complications

## 2013-03-10 NOTE — Progress Notes (Signed)
Subjective: 1 Day Post-Op Procedure(s) (LRB): OPEN REDUCTION INTERNAL FIXATION (ORIF) ANKLE FRACTURE right (Right) Patient reports pain as mild.    Objective: Vital signs in last 24 hours: Temp:  [97.3 F (36.3 C)-98.7 F (37.1 C)] 98.7 F (37.1 C) (03/05 0736) Pulse Rate:  [51-103] 83 (03/05 0736) Resp:  [10-20] 18 (03/05 0736) BP: (111-135)/(56-86) 126/71 mmHg (03/05 0736) SpO2:  [89 %-100 %] 89 % (03/05 0736) Weight:  [210 lb (95.255 kg)] 210 lb (95.255 kg) (03/04 1837)  Intake/Output from previous day: 03/04 0701 - 03/05 0700 In: 1000 [I.V.:1000] Out: 225 [Urine:225] Intake/Output this shift:     Recent Labs  03/09/13 1706 03/09/13 1727  HGB 15.9 15.3    Recent Labs  03/09/13 1706 03/09/13 1727  WBC 8.0  --   RBC 5.26  --   HCT 44.9 45.0  PLT 157  --     Recent Labs  03/09/13 1727  NA 145  K 3.7  CL 106  BUN 13  CREATININE 1.20  GLUCOSE 111*    Recent Labs  03/09/13 1706 03/10/13 0502  INR 1.93* 2.37*    Neurologically intact ABD soft Neurovascular intact Sensation intact distally Intact pulses distally Dorsiflexion/Plantar flexion intact No cellulitis present Compartment soft  Assessment/Plan: 1 Day Post-Op Procedure(s) (LRB): OPEN REDUCTION INTERNAL FIXATION (ORIF) ANKLE FRACTURE right (Right) Advance diet Up with therapy Discharge home with home health Followup [place] 10-14 days in the office.  Peighton Mehra L 03/10/2013, 8:31 AM

## 2013-03-10 NOTE — Discharge Instructions (Signed)
Ambulate NON weight bearing on your right leg Elevate your leg above your heart as much as possible  Information on my medicine - Coumadin   (Warfarin)  This medication education was reviewed with me or my healthcare representative as part of my discharge preparation.  The pharmacist that spoke with me during my hospital stay was:  Lynelle Doctor, [facility]  Why was Coumadin prescribed for you? Coumadin was prescribed for you because you have a blood clot or a medical condition that can cause an increased risk of forming blood clots. Blood clots can cause serious health problems by blocking the flow of blood to the heart, lung, or brain. Coumadin can prevent harmful blood clots from forming. As a reminder your indication for Coumadin is:   Deep Vein Thrombosis Treatment  What test will check on my response to Coumadin? While on Coumadin (warfarin) you will need to have an INR test regularly to ensure that your dose is keeping you in the desired range. The INR (international normalized ratio) number is calculated from the result of the laboratory test called prothrombin time (PT).  If an INR APPOINTMENT HAS NOT ALREADY BEEN MADE FOR YOU please schedule an appointment to have this lab work done by your health care provider within 7 days. Your INR goal is usually a number between:  2 to 3 or your provider may give you a more narrow range like 2-2.5.  Ask your health care provider during an office visit what your goal INR is.  What  do you need to  know  About  COUMADIN? Take Coumadin (warfarin) exactly as prescribed by your healthcare provider about the same time each day.  DO NOT stop taking without talking to the doctor who prescribed the medication.  Stopping without other blood clot prevention medication to take the place of Coumadin may increase your risk of developing a new clot or stroke.  Get refills before you run out.  What do you do if you miss a dose? If you miss a dose, take it as soon as you  remember on the same day then continue your regularly scheduled regimen the next day.  Do not take two doses of Coumadin at the same time.  Important Safety Information A possible side effect of Coumadin (Warfarin) is an increased risk of bleeding. You should call your healthcare provider right away if you experience any of the following:   Bleeding from an injury or your nose that does not stop.   Unusual colored urine (red or dark brown) or unusual colored stools (red or black).   Unusual bruising for unknown reasons.   A serious fall or if you hit your head (even if there is no bleeding).  Some foods or medicines interact with Coumadin (warfarin) and might alter your response to warfarin. To help avoid this:   Eat a balanced diet, maintaining a consistent amount of Vitamin K.   Notify your provider about major diet changes you plan to make.   Avoid alcohol or limit your intake to 1 drink for women and 2 drinks for men per day. (1 drink is 5 oz. wine, 12 oz. beer, or 1.5 oz. liquor.)  Make sure that ANY health care provider who prescribes medication for you knows that you are taking Coumadin (warfarin).  Also make sure the healthcare provider who is monitoring your Coumadin knows when you have started a new medication including herbals and non-prescription products.  Coumadin (Warfarin)  Major Drug Interactions  Increased  Warfarin Effect Decreased Warfarin Effect  Alcohol (large quantities) Antibiotics (esp. Septra/Bactrim, Flagyl, Cipro) Amiodarone (Cordarone) Aspirin (ASA) Cimetidine (Tagamet) Megestrol (Megace) NSAIDs (ibuprofen, naproxen, etc.) Piroxicam (Feldene) Propafenone (Rythmol SR) Propranolol (Inderal) Isoniazid (INH) Posaconazole (Noxafil) Barbiturates (Phenobarbital) Carbamazepine (Tegretol) Chlordiazepoxide (Librium) Cholestyramine (Questran) Griseofulvin Oral Contraceptives Rifampin Sucralfate (Carafate) Vitamin K   Coumadin (Warfarin) Major Herbal  Interactions  Increased Warfarin Effect Decreased Warfarin Effect  Garlic Ginseng Ginkgo biloba Coenzyme Q10 Green tea St. Johns wort    Coumadin (Warfarin) FOOD Interactions  Eat a consistent number of servings per week of foods HIGH in Vitamin K (1 serving =  cup)  Collards (cooked, or boiled & drained) Kale (cooked, or boiled & drained) Mustard greens (cooked, or boiled & drained) Parsley *serving size only =  cup Spinach (cooked, or boiled & drained) Swiss chard (cooked, or boiled & drained) Turnip greens (cooked, or boiled & drained)  Eat a consistent number of servings per week of foods MEDIUM-HIGH in Vitamin K (1 serving = 1 cup)  Asparagus (cooked, or boiled & drained) Broccoli (cooked, boiled & drained, or raw & chopped) Brussel sprouts (cooked, or boiled & drained) *serving size only =  cup Lettuce, raw (green leaf, endive, romaine) Spinach, raw Turnip greens, raw & chopped   These websites have more information on Coumadin (warfarin):  FailFactory.se; VeganReport.com.au;

## 2013-03-10 NOTE — ED Provider Notes (Signed)
I saw and evaluated the patient, reviewed the resident's note and I agree with the findings and plan. If applicable, I agree with the resident's interpretation of the EKG.  If applicable, I was present for critical portions of any procedures performed.  Mechanical fall with R ankle pain and deformity.  Denies hitting head or LOC. On coumadin for DVT hx, but has history of SDH. At neuro baseline. Denies any other pain. R ankle with posterior deformity and diffuse swelling. +1 PT and DP pulse. Able to wiggle toes, no open wounds. Check CT head.  Ezequiel Essex, MD 03/10/13 [phone]

## 2013-03-10 NOTE — Progress Notes (Signed)
Patient expressed that he is uncomfortable going home prior to his nerve block wearing off, as well as PT feels that he would benefit from another day of therapy, as he needs to still practice using the stairs. Gaspar Skeeters, PA aware, patient will d/c tomorrow.

## 2013-03-10 NOTE — Op Note (Signed)
NAMEJAMARCUS, Tyler Mason NO.:  [id number]  MEDICAL RECORD NO.:  [id number]  LOCATION:  5N07C                        FACILITY:  [facility]  PHYSICIAN:  Alta Corning, M.D.   DATE OF BIRTH:  11-29-1950  DATE OF PROCEDURE:  03/09/2013 DATE OF DISCHARGE:                              OPERATIVE REPORT   PREOPERATIVE DIAGNOSIS:  Fracture dislocation, right ankle.  POSTOPERATIVE DIAGNOSIS:  Fracture dislocation, right ankle.  PROCEDURE: 1. Open reduction and internal fixation of lateral side ankle fracture     in the setting of a trimalleolar ankle fracture. 2. Open reduction of dislocation, right ankle.  SURGEON:  Alta Corning, M.D.  ASSISTANT:  [name], P.A.  ANESTHESIA:  General.  BRIEF HISTORY:  [name] is a 63 year old male who was walking out of his house today, slipped and fell, and had a fracture-dislocation of the ankle, seen in the emergency room, where he was noted to have a trimalleolar ankle fracture dislocation.  We were consulted for evaluation.  At that point, he was noted to have some tenting of the skin, and he was brought emergently to the operating room for reduction and fixation.  DESCRIPTION OF PROCEDURE:  The patient was brought to the operating room.  After adequate anesthesia was obtained with general anesthetic, the patient was placed supine on the operating table.  The right leg was prepped and draped in usual sterile fashion.  At this point, a lateral incision was made subcutaneously over the center of the lateral malleolus.  A manipulative closed reduction was undertaken at the lateral malleolus, and once we got it fixed, we brought in fluoro to assess the overall situation.  At that point, we noted him to have a posterior malleolus fracture, but the medial malleolus fracture did not seem to be displaced in a way that could be fixed with any kind of fixation.  So, at this point, we felt that closed reduction of the posterior  malleolus fracture will be adequate and decision was made.  At this point, multiple fluoroscopic images were taken in multiple oblique situations.  At that point, __________ tibiotalar joint was anatomically reduced.  There was about a 10% posterior malleolus fragment too small to accept screws from front to back and the lateral fibula fracture have been anatomically reduced.  At this point, the wound was copiously irrigated and suctioned dry, closed in layers.  Sterile compressive dressing was applied as well as a U and a posterior splint.  Posterior splint was carefully carried out to the tip of the greater toe to try to hold the toe in extension to bring the flexor __________ around the posterior malleolar piece.  At this point, the patient was taken to recovery room in satisfactory condition, to be admitted overnight for observation and make sure that he can get up and mobilize with therapy in the morning.     Alta Corning, M.D.     [name]  D:  03/09/2013  T:  03/10/2013  Job:  [id number]

## 2013-03-11 DIAGNOSIS — S82853A Displaced trimalleolar fracture of unspecified lower leg, initial encounter for closed fracture: Secondary | ICD-10-CM | POA: Diagnosis not present

## 2013-03-11 LAB — PROTIME-INR
INR: 3.36 — ABNORMAL HIGH (ref 0.00–1.49)
Prothrombin Time: 32.8 seconds — ABNORMAL HIGH (ref 11.6–15.2)

## 2013-03-11 NOTE — Care Management Note (Signed)
CARE MANAGEMENT NOTE 03/11/2013  Patient:  Tyler Mason, Tyler Mason   Account Number:  [id number]  Date Initiated:  03/10/2013  Documentation initiated by:  Ricki Miller  Subjective/Objective Assessment:   63 yr old male s/p right anfle ORIF     Action/Plan:   Case manager spoke with patient and wife concerning home health and DME needs. They have walker, 3in1 and shower seat. Choice offered, CM called referral to [facility], [place].   Anticipated DC Date:  03/11/2013   Anticipated DC Plan:  [place]  CM consult      [facility] Choice  HOME HEALTH   Choice offered to / List presented to:  C-1 Patient        [place] arranged  [place] [facility]      [place].   Status of service:  Completed, signed off Medicare Important Message given?   (If response is "NO", the following Medicare IM given date fields will be blank) Date Medicare IM given:   Date Additional Medicare IM given:    Discharge Disposition:  [place]

## 2013-03-11 NOTE — Progress Notes (Signed)
Subjective: 2 Days Post-Op Procedure(s) (LRB): OPEN REDUCTION INTERNAL FIXATION (ORIF) ANKLE FRACTURE right (Right) Patient reports pain as 3 on 0-10 scale.   Good progress with PT. Objective: Vital signs in last 24 hours: Temp:  [97.8 F (36.6 C)-98 F (36.7 C)] 97.8 F (36.6 C) (03/06 0502) Pulse Rate:  [63-98] 63 (03/06 0502) Resp:  [16-18] 18 (03/06 0502) BP: (117-133)/(64-68) 117/64 mmHg (03/06 0502) SpO2:  [93 %-94 %] 93 % (03/06 0502)  Intake/Output from previous day: 03/05 0701 - 03/06 0700 In: 800 [P.O.:800] Out: 2600 [Urine:2600] Intake/Output this shift:     Recent Labs  03/09/13 1706 03/09/13 1727  HGB 15.9 15.3    Recent Labs  03/09/13 1706 03/09/13 1727  WBC 8.0  --   RBC 5.26  --   HCT 44.9 45.0  PLT 157  --     Recent Labs  03/09/13 1727  NA 145  K 3.7  CL 106  BUN 13  CREATININE 1.20  GLUCOSE 111*    Recent Labs  03/10/13 0502 03/11/13 0516  INR 2.37* 3.36*   Right leg exam: Posterior splint intact. Neurovascular intact Sensation intact distally  Assessment/Plan: 2 Days Post-Op Procedure(s) (LRB): OPEN REDUCTION INTERNAL FIXATION (ORIF) ANKLE FRACTURE right (Right) Plan: Discharge home  Amb NWB on right F/U Dr Berenice Primas in 2 weeks.   Coralee Edberg G 03/11/2013, 12:26 PM

## 2013-03-11 NOTE — Progress Notes (Signed)
Physical Therapy Treatment Patient Details Name: ISAIR INABINET MRN: [id number] DOB: 17-Feb-1950 Today's Date: 03/11/2013 Time: 3086-5784 PT Time Calculation (min): 24 min  PT Assessment / Plan / Recommendation  History of Present Illness pt presents with R ankle fx s/p ORIF.     PT Comments   *Stair training completed with pt and his wife. Instructed pt/wife in home exercise program. REady to DC home from PT standpoint. **  Follow Up Recommendations  Home health PT;Supervision/Assistance - 24 hour     Does the patient have the potential to tolerate intense rehabilitation     Barriers to Discharge        Equipment Recommendations  None recommended by PT (pt's wife stated they are able to borrow RW, BSC, seat for shower)    Recommendations for Other Services    Frequency Min 5X/week   Progress towards PT Goals Progress towards PT goals: Progressing toward goals  Plan      Precautions / Restrictions Restrictions Weight Bearing Restrictions: Yes RLE Weight Bearing: Non weight bearing   Pertinent Vitals/Pain **0/10 pain*    Mobility  Bed Mobility Overal bed mobility: Modified Independent General bed mobility comments: HOB elevated, used rail Transfers Overall transfer level: Needs assistance Equipment used: Rolling walker (2 wheeled) Transfers: Sit to/from Stand Sit to Stand: Min guard General transfer comment: cues for UE use and to slow down as pt tends to rush.   Ambulation/Gait Ambulation/Gait assistance: Supervision Ambulation Distance (Feet): 30 Feet Assistive device: Rolling walker (2 wheeled) Gait Pattern/deviations: Step-to pattern General Gait Details: supervision for safety Stairs: Yes Stairs assistance: Min assist Stair Management: No rails;Backwards;With walker Number of Stairs: 3 General stair comments: stair training completed with pt's wife assisting to stabilize RW, pt steady    Exercises General Exercises - Lower Extremity Quad Sets:  AROM;Right;Supine;5 reps Short Arc Quad: AROM;Right;5 reps;Supine Long Arc Quad: AROM;Right;5 reps;Supine Heel Slides: AROM;Right;5 reps;Supine Hip ABduction/ADduction: AROM;Right;5 reps;Supine Straight Leg Raises: AAROM;Right;5 reps;Supine   PT Diagnosis:    PT Problem List:   PT Treatment Interventions:     PT Goals (current goals can now be found in the care plan section) Acute Rehab PT Goals Patient Stated Goal: Home PT Goal Formulation: With patient/family Time For Goal Achievement: 03/24/13 Potential to Achieve Goals: Good  Visit Information  Last PT Received On: 03/11/13 Assistance Needed: +1 History of Present Illness: pt presents with R ankle fx s/p ORIF.      Subjective Data  Patient Stated Goal: Home   Cognition  Cognition Arousal/Alertness: Awake/alert Behavior During Therapy: WFL for tasks assessed/performed Overall Cognitive Status: Within Functional Limits for tasks assessed    Balance  Balance Standing balance-Leahy Scale: Poor  End of Session PT - End of Session Equipment Utilized During Treatment: Gait belt Activity Tolerance: Patient tolerated treatment well Patient left: in chair;with call bell/phone within reach;with family/visitor present Nurse Communication: Mobility status   GP Functional Assessment Tool Used: Clinical Judgement Functional Limitation: Mobility: Walking and moving around Mobility: Walking and Moving Around Discharge Status [phone]): At least 1 percent but less than 20 percent impaired, limited or restricted   Philomena Doheny 03/11/2013, 11:35 AM [phone]

## 2013-03-11 NOTE — Progress Notes (Signed)
Patient d/c to home, IV removed, prescriptions given and instructions reviewed. 

## 2013-03-14 ENCOUNTER — Inpatient Hospital Stay: Admission: RE | Admit: 2013-03-14 | Payer: 59 | Source: Ambulatory Visit

## 2013-04-19 DIAGNOSIS — M25579 Pain in unspecified ankle and joints of unspecified foot: Secondary | ICD-10-CM | POA: Diagnosis not present

## 2013-05-05 DIAGNOSIS — F0630 Mood disorder due to known physiological condition, unspecified: Secondary | ICD-10-CM | POA: Diagnosis not present

## 2013-05-19 DIAGNOSIS — M25579 Pain in unspecified ankle and joints of unspecified foot: Secondary | ICD-10-CM | POA: Diagnosis not present

## 2013-06-21 NOTE — Progress Notes (Signed)
03/11/13 1134  PT G-Codes **NOT FOR INPATIENT CLASS**  Functional Assessment Tool Used Clinical Judgement  Functional Limitation Mobility: Walking and moving around  Mobility: Walking and Moving Around Goal Status [phone]) CH  Mobility: Walking and Moving Around Discharge Status [phone]) CI  G- Codes per Argie Ramming, PT

## 2013-07-19 DIAGNOSIS — N390 Urinary tract infection, site not specified: Secondary | ICD-10-CM | POA: Diagnosis not present

## 2013-08-02 DIAGNOSIS — N200 Calculus of kidney: Secondary | ICD-10-CM | POA: Diagnosis not present

## 2013-08-02 DIAGNOSIS — N390 Urinary tract infection, site not specified: Secondary | ICD-10-CM | POA: Diagnosis not present

## 2013-08-23 DIAGNOSIS — N390 Urinary tract infection, site not specified: Secondary | ICD-10-CM | POA: Diagnosis not present

## 2013-08-23 DIAGNOSIS — N419 Inflammatory disease of prostate, unspecified: Secondary | ICD-10-CM | POA: Diagnosis not present

## 2013-10-20 DIAGNOSIS — Z23 Encounter for immunization: Secondary | ICD-10-CM | POA: Diagnosis not present

## 2013-11-03 ENCOUNTER — Ambulatory Visit
Admission: RE | Admit: 2013-11-03 | Discharge: 2013-11-03 | Disposition: A | Discharge status: Home / Self Care | Payer: 59 | Source: Ambulatory Visit | Attending: Gastroenterology | Admitting: Gastroenterology

## 2013-11-03 DIAGNOSIS — K7689 Other specified diseases of liver: Secondary | ICD-10-CM | POA: Diagnosis not present

## 2013-11-03 DIAGNOSIS — R1011 Right upper quadrant pain: Secondary | ICD-10-CM

## 2014-04-10 ENCOUNTER — Ambulatory Visit
Admission: RE | Admit: 2014-04-10 | Discharge: 2014-04-10 | Disposition: A | Discharge status: Home / Self Care | Payer: 59 | Source: Ambulatory Visit | Attending: Internal Medicine | Admitting: Internal Medicine

## 2014-04-10 ENCOUNTER — Other Ambulatory Visit: Payer: Self-pay | Admitting: Internal Medicine

## 2014-04-10 DIAGNOSIS — R042 Hemoptysis: Secondary | ICD-10-CM

## 2014-04-10 DIAGNOSIS — R05 Cough: Secondary | ICD-10-CM | POA: Diagnosis not present

## 2014-05-12 ENCOUNTER — Other Ambulatory Visit: Payer: Self-pay | Admitting: Internal Medicine

## 2014-05-12 ENCOUNTER — Ambulatory Visit
Admission: RE | Admit: 2014-05-12 | Discharge: 2014-05-12 | Disposition: A | Discharge status: Home / Self Care | Payer: 59 | Source: Ambulatory Visit | Attending: Internal Medicine | Admitting: Internal Medicine

## 2014-05-12 DIAGNOSIS — J189 Pneumonia, unspecified organism: Secondary | ICD-10-CM

## 2014-07-27 DIAGNOSIS — F330 Major depressive disorder, recurrent, mild: Secondary | ICD-10-CM | POA: Diagnosis not present

## 2014-07-27 DIAGNOSIS — F0632 Mood disorder due to known physiological condition with major depressive-like episode: Secondary | ICD-10-CM | POA: Diagnosis not present

## 2014-09-05 DIAGNOSIS — N200 Calculus of kidney: Secondary | ICD-10-CM | POA: Diagnosis not present

## 2014-09-29 DIAGNOSIS — N201 Calculus of ureter: Secondary | ICD-10-CM | POA: Diagnosis not present

## 2014-09-29 DIAGNOSIS — R300 Dysuria: Secondary | ICD-10-CM | POA: Diagnosis not present

## 2014-10-04 DIAGNOSIS — N201 Calculus of ureter: Secondary | ICD-10-CM | POA: Diagnosis not present

## 2014-10-05 DIAGNOSIS — N201 Calculus of ureter: Secondary | ICD-10-CM | POA: Diagnosis not present

## 2014-10-10 ENCOUNTER — Other Ambulatory Visit: Payer: Self-pay | Admitting: Urology

## 2014-10-19 NOTE — Patient Instructions (Addendum)
LATERRIAN HEVENER  10/19/2014   Your procedure is scheduled on: October 27, 2014  Report to [facility] Main  Entrance take [facility]  elevators to 3rd floor to  [place] at  9:00 AM.  Call this number if you have problems the morning of surgery [phone]   Remember: ONLY 1 PERSON MAY GO WITH YOU TO SHORT STAY TO GET  READY MORNING OF [place].  Do not eat food or drink liquids :After Midnight.     Take these medicines the morning of surgery with A SIP OF WATER: Zoloft DO NOT TAKE ANY DIABETIC MEDICATIONS DAY OF YOUR SURGERY                               You may not have any metal on your body including hair pins and              piercings  Do not wear jewelry, lotions, powders or perfumes, deodorant                        Men may shave face and neck.   Do not bring valuables to the hospital. [place].  Contacts, dentures or bridgework may not be worn into surgery.     Patients discharged the day of surgery will not be allowed to drive home.  Name and phone number of your driver: Leiby Pigeon - wife  Special Instructions:  Coughing and deep breathing exercises, leg exercises                        Please read over the following fact sheets you were given: _____________________________________________________________________             [facility] - Preparing for Surgery Before surgery, you can play an important role.  Because skin is not sterile, your skin needs to be as free of germs as possible.  You can reduce the number of germs on your skin by washing with CHG (chlorahexidine gluconate) soap before surgery.  CHG is an antiseptic cleaner which kills germs and bonds with the skin to continue killing germs even after washing. Please DO NOT use if you have an allergy to CHG or antibacterial soaps.  If your skin becomes reddened/irritated stop using the CHG and inform your nurse when you arrive at  Short Stay. Do not shave (including legs and underarms) for at least 48 hours prior to the first CHG shower.  You may shave your face/neck. Please follow these instructions carefully:  1.  Shower with CHG Soap the night before surgery and the  morning of Surgery.  2.  If you choose to wash your hair, wash your hair first as usual with your  normal  shampoo.  3.  After you shampoo, rinse your hair and body thoroughly to remove the  shampoo.                           4.  Use CHG as you would any other liquid soap.  You can apply chg directly  to the skin and wash  Gently with a scrungie or clean washcloth.  5.  Apply the CHG Soap to your body ONLY FROM THE NECK DOWN.   Do not use on face/ open                           Wound or open sores. Avoid contact with eyes, ears mouth and genitals (private parts).                       Wash face,  Genitals (private parts) with your normal soap.             6.  Wash thoroughly, paying special attention to the area where your surgery  will be performed.  7.  Thoroughly rinse your body with warm water from the neck down.  8.  DO NOT shower/wash with your normal soap after using and rinsing off  the CHG Soap.                9.  Pat yourself dry with a clean towel.            10.  Wear clean pajamas.            11.  Place clean sheets on your bed the night of your first shower and do not  sleep with pets. Day of Surgery : Do not apply any lotions/deodorants the morning of surgery.  Please wear clean clothes to the hospital/surgery center.  FAILURE TO FOLLOW THESE INSTRUCTIONS MAY RESULT IN THE CANCELLATION OF YOUR SURGERY PATIENT SIGNATURE_________________________________  NURSE SIGNATURE__________________________________  ________________________________________________________________________

## 2014-10-23 ENCOUNTER — Encounter (HOSPITAL_COMMUNITY): Payer: Self-pay

## 2014-10-23 ENCOUNTER — Encounter (HOSPITAL_COMMUNITY)
Admission: RE | Admit: 2014-10-23 | Discharge: 2014-10-23 | Disposition: A | Discharge status: Home / Self Care | Payer: 59 | Source: Ambulatory Visit | Attending: Urology | Admitting: Urology

## 2014-10-23 DIAGNOSIS — N201 Calculus of ureter: Secondary | ICD-10-CM | POA: Diagnosis not present

## 2014-10-23 DIAGNOSIS — Z01818 Encounter for other preprocedural examination: Secondary | ICD-10-CM | POA: Insufficient documentation

## 2014-10-23 LAB — CBC
HCT: 47.9 % (ref 39.0–52.0)
Hemoglobin: 16 g/dL (ref 13.0–17.0)
MCH: 29.3 pg (ref 26.0–34.0)
MCHC: 33.4 g/dL (ref 30.0–36.0)
MCV: 87.7 fL (ref 78.0–100.0)
Platelets: 183 10*3/uL (ref 150–400)
RBC: 5.46 MIL/uL (ref 4.22–5.81)
RDW: 14.7 % (ref 11.5–15.5)
WBC: 6.9 10*3/uL (ref 4.0–10.5)

## 2014-10-23 LAB — PROTIME-INR
INR: 2.27 — ABNORMAL HIGH (ref 0.00–1.49)
Prothrombin Time: 24.8 seconds — ABNORMAL HIGH (ref 11.6–15.2)

## 2014-10-23 LAB — URINE MICROSCOPIC-ADD ON

## 2014-10-23 LAB — URINALYSIS, ROUTINE W REFLEX MICROSCOPIC
Bilirubin Urine: NEGATIVE
Glucose, UA: NEGATIVE mg/dL
Ketones, ur: NEGATIVE mg/dL
Nitrite: NEGATIVE
Protein, ur: NEGATIVE mg/dL
Specific Gravity, Urine: 1.015 (ref 1.005–1.030)
Urobilinogen, UA: 0.2 mg/dL (ref 0.0–1.0)
pH: 7 (ref 5.0–8.0)

## 2014-10-23 LAB — APTT: aPTT: 39 seconds — ABNORMAL HIGH (ref 24–37)

## 2014-10-23 NOTE — Progress Notes (Signed)
05-12-14 - 2V CXR - EPIC

## 2014-10-23 NOTE — Progress Notes (Signed)
10-23-14 - At preop visit on 10-23-14, pt. Stated he is on coumadin.  He stated that he was not told stop it for surgical procedure on 10-27-14.  Advised to call Dr. Fonnie Jarvis to verify.  I also called Dr. Simone Curia office and left message for  Darrel Reach to call me back to verify if patient needs to stop coumadin.

## 2014-10-23 NOTE — Anesthesia Preprocedure Evaluation (Addendum)
Anesthesia Evaluation  Patient identified by MRN, date of birth, ID band Patient awake    Reviewed: Allergy & Precautions, H&P , NPO status , Patient's Chart, lab work & pertinent test results, reviewed documented beta blocker date and time   Airway Mallampati: II  TM Distance: >3 FB Neck ROM: Full    Dental  (+) Teeth Intact, Dental Advisory Given,    Pulmonary sleep apnea , former smoker,    breath sounds clear to auscultation       Cardiovascular  Rhythm:Regular Rate:Normal     Neuro/Psych    GI/Hepatic GERD  Medicated,  Endo/Other    Renal/GU      Musculoskeletal   Abdominal (+) + obese,   Peds  Hematology   Anesthesia Other Findings   Reproductive/Obstetrics                         Anesthesia Physical  Anesthesia Plan  ASA: III  Anesthesia Plan: General   Post-op Pain Management:    Induction: Intravenous  Airway Management Planned: LMA and Oral ETT  Additional Equipment:   Intra-op Plan:   Post-operative Plan: Extubation in OR  Informed Consent: I have reviewed the patients History and Physical, chart, labs and discussed the procedure including the risks, benefits and alternatives for the proposed anesthesia with the patient or authorized representative who has indicated his/her understanding and acceptance.   Dental advisory given  Plan Discussed with: CRNA and Anesthesiologist  Anesthesia Plan Comments: (Has been on Coumadin for DVT)      Anesthesia Quick Evaluation

## 2014-10-27 ENCOUNTER — Ambulatory Visit (HOSPITAL_COMMUNITY): Payer: 59 | Admitting: Anesthesiology

## 2014-10-27 ENCOUNTER — Encounter (HOSPITAL_COMMUNITY)
Admission: RE | Disposition: A | Discharge status: Home / Self Care | Payer: Self-pay | Source: Ambulatory Visit | Attending: Urology

## 2014-10-27 ENCOUNTER — Ambulatory Visit (HOSPITAL_COMMUNITY)
Admission: RE | Admit: 2014-10-27 | Discharge: 2014-10-27 | Disposition: A | Discharge status: Home / Self Care | Payer: 59 | Source: Ambulatory Visit | Attending: Urology | Admitting: Urology

## 2014-10-27 ENCOUNTER — Encounter (HOSPITAL_COMMUNITY): Payer: Self-pay | Admitting: *Deleted

## 2014-10-27 DIAGNOSIS — Z79899 Other long term (current) drug therapy: Secondary | ICD-10-CM | POA: Diagnosis not present

## 2014-10-27 DIAGNOSIS — Z87440 Personal history of urinary (tract) infections: Secondary | ICD-10-CM | POA: Diagnosis not present

## 2014-10-27 DIAGNOSIS — N201 Calculus of ureter: Secondary | ICD-10-CM | POA: Diagnosis not present

## 2014-10-27 DIAGNOSIS — E669 Obesity, unspecified: Secondary | ICD-10-CM | POA: Diagnosis not present

## 2014-10-27 DIAGNOSIS — Z87891 Personal history of nicotine dependence: Secondary | ICD-10-CM | POA: Diagnosis not present

## 2014-10-27 DIAGNOSIS — Z6830 Body mass index (BMI) 30.0-30.9, adult: Secondary | ICD-10-CM | POA: Diagnosis not present

## 2014-10-27 DIAGNOSIS — G4730 Sleep apnea, unspecified: Secondary | ICD-10-CM | POA: Insufficient documentation

## 2014-10-27 DIAGNOSIS — N132 Hydronephrosis with renal and ureteral calculous obstruction: Secondary | ICD-10-CM | POA: Diagnosis not present

## 2014-10-27 DIAGNOSIS — Z7901 Long term (current) use of anticoagulants: Secondary | ICD-10-CM | POA: Diagnosis not present

## 2014-10-27 DIAGNOSIS — F329 Major depressive disorder, single episode, unspecified: Secondary | ICD-10-CM | POA: Diagnosis not present

## 2014-10-27 DIAGNOSIS — N200 Calculus of kidney: Secondary | ICD-10-CM | POA: Diagnosis not present

## 2014-10-27 DIAGNOSIS — Z86718 Personal history of other venous thrombosis and embolism: Secondary | ICD-10-CM | POA: Diagnosis not present

## 2014-10-27 DIAGNOSIS — E7800 Pure hypercholesterolemia, unspecified: Secondary | ICD-10-CM | POA: Insufficient documentation

## 2014-10-27 DIAGNOSIS — Z86711 Personal history of pulmonary embolism: Secondary | ICD-10-CM | POA: Insufficient documentation

## 2014-10-27 DIAGNOSIS — Z87442 Personal history of urinary calculi: Secondary | ICD-10-CM | POA: Insufficient documentation

## 2014-10-27 DIAGNOSIS — K219 Gastro-esophageal reflux disease without esophagitis: Secondary | ICD-10-CM | POA: Diagnosis not present

## 2014-10-27 HISTORY — PX: CYSTOSCOPY WITH RETROGRADE PYELOGRAM, URETEROSCOPY AND STENT PLACEMENT: SHX5789

## 2014-10-27 LAB — PROTIME-INR
INR: 1.19 (ref 0.00–1.49)
Prothrombin Time: 15.3 seconds — ABNORMAL HIGH (ref 11.6–15.2)

## 2014-10-27 SURGERY — CYSTOURETEROSCOPY, WITH RETROGRADE PYELOGRAM AND STENT INSERTION
Anesthesia: General | Laterality: Left

## 2014-10-27 MED ORDER — TAMSULOSIN HCL 0.4 MG PO CAPS
0.4000 mg | ORAL_CAPSULE | Freq: Once | ORAL | Status: AC
  Administered 2014-10-27: 0.4 mg via ORAL
  Filled 2014-10-27: qty 1

## 2014-10-27 MED ORDER — OXYCODONE HCL 10 MG PO TABS: 10.0000 mg | ORAL_TABLET | ORAL | Status: DC | PRN

## 2014-10-27 MED ORDER — PROPOFOL 10 MG/ML IV BOLUS
INTRAVENOUS | Status: DC | PRN
  Administered 2014-10-27: 150 mg via INTRAVENOUS

## 2014-10-27 MED ORDER — DEXAMETHASONE SODIUM PHOSPHATE 10 MG/ML IJ SOLN
INTRAMUSCULAR | Status: AC
  Filled 2014-10-27: qty 1

## 2014-10-27 MED ORDER — ONDANSETRON HCL 4 MG/2ML IJ SOLN
INTRAMUSCULAR | Status: AC
  Filled 2014-10-27: qty 2

## 2014-10-27 MED ORDER — SODIUM CHLORIDE 0.9 % IR SOLN
Status: DC | PRN
  Administered 2014-10-27: 1000 mL
  Administered 2014-10-27: 3000 mL

## 2014-10-27 MED ORDER — PROPOFOL 10 MG/ML IV BOLUS
INTRAVENOUS | Status: AC
  Filled 2014-10-27: qty 20

## 2014-10-27 MED ORDER — IOHEXOL 300 MG/ML  SOLN
INTRAMUSCULAR | Status: DC | PRN
  Administered 2014-10-27: 25 mL

## 2014-10-27 MED ORDER — PHENAZOPYRIDINE HCL 200 MG PO TABS: 200.0000 mg | ORAL_TABLET | Freq: Three times a day (TID) | ORAL | Status: DC | PRN

## 2014-10-27 MED ORDER — DEXAMETHASONE SODIUM PHOSPHATE 10 MG/ML IJ SOLN
INTRAMUSCULAR | Status: DC | PRN
  Administered 2014-10-27: 10 mg via INTRAVENOUS

## 2014-10-27 MED ORDER — TAMSULOSIN HCL 0.4 MG PO CAPS
0.4000 mg | ORAL_CAPSULE | ORAL | Status: DC
Start: 2014-10-27 — End: 2016-02-11

## 2014-10-27 MED ORDER — CIPROFLOXACIN IN D5W 400 MG/200ML IV SOLN
INTRAVENOUS | Status: AC
  Filled 2014-10-27: qty 200

## 2014-10-27 MED ORDER — MEPERIDINE HCL 50 MG/ML IJ SOLN: 6.2500 mg | INTRAMUSCULAR | Status: DC | PRN

## 2014-10-27 MED ORDER — LACTATED RINGERS IV SOLN
INTRAVENOUS | Status: DC
  Administered 2014-10-27: 1000 mL via INTRAVENOUS

## 2014-10-27 MED ORDER — MIDAZOLAM HCL 2 MG/2ML IJ SOLN
INTRAMUSCULAR | Status: AC
  Filled 2014-10-27: qty 4

## 2014-10-27 MED ORDER — PHENAZOPYRIDINE HCL 200 MG PO TABS
200.0000 mg | ORAL_TABLET | Freq: Once | ORAL | Status: AC
  Administered 2014-10-27: 200 mg via ORAL
  Filled 2014-10-27: qty 1

## 2014-10-27 MED ORDER — FENTANYL CITRATE (PF) 100 MCG/2ML IJ SOLN
INTRAMUSCULAR | Status: AC
  Filled 2014-10-27: qty 4

## 2014-10-27 MED ORDER — PROMETHAZINE HCL 25 MG/ML IJ SOLN: 6.2500 mg | INTRAMUSCULAR | Status: DC | PRN

## 2014-10-27 MED ORDER — ONDANSETRON HCL 4 MG/2ML IJ SOLN
INTRAMUSCULAR | Status: DC | PRN
  Administered 2014-10-27: 4 mg via INTRAVENOUS

## 2014-10-27 MED ORDER — LIDOCAINE HCL (CARDIAC) 20 MG/ML IV SOLN
INTRAVENOUS | Status: DC | PRN
  Administered 2014-10-27: 100 mg via INTRAVENOUS

## 2014-10-27 MED ORDER — MIDAZOLAM HCL 5 MG/5ML IJ SOLN
INTRAMUSCULAR | Status: DC | PRN
  Administered 2014-10-27: 2 mg via INTRAVENOUS

## 2014-10-27 MED ORDER — FENTANYL CITRATE (PF) 100 MCG/2ML IJ SOLN: 25.0000 ug | INTRAMUSCULAR | Status: DC | PRN

## 2014-10-27 MED ORDER — LIDOCAINE HCL (CARDIAC) 20 MG/ML IV SOLN
INTRAVENOUS | Status: AC
  Filled 2014-10-27: qty 5

## 2014-10-27 MED ORDER — FENTANYL CITRATE (PF) 100 MCG/2ML IJ SOLN
INTRAMUSCULAR | Status: DC | PRN
  Administered 2014-10-27: 50 ug via INTRAVENOUS

## 2014-10-27 MED ORDER — CIPROFLOXACIN IN D5W 400 MG/200ML IV SOLN
400.0000 mg | INTRAVENOUS | Status: AC
  Administered 2014-10-27: 400 mg via INTRAVENOUS

## 2014-10-27 MED ORDER — EPHEDRINE SULFATE 50 MG/ML IJ SOLN
INTRAMUSCULAR | Status: DC | PRN
  Administered 2014-10-27 (×2): 10 mg via INTRAVENOUS

## 2014-10-27 SURGICAL SUPPLY — 22 items
BAG URO CATCHER STRL LF (DRAPE) ×2 IMPLANT
BASKET ZERO TIP NITINOL 2.4FR (BASKET) ×1 IMPLANT
BSKT STON RTRVL ZERO TP 2.4FR (BASKET) ×1
CATH INTERMIT  6FR 70CM (CATHETERS) ×2 IMPLANT
CLOTH BEACON ORANGE TIMEOUT ST (SAFETY) ×2 IMPLANT
FIBER LASER FLEXIVA 1000 (UROLOGICAL SUPPLIES) IMPLANT
FIBER LASER FLEXIVA 200 (UROLOGICAL SUPPLIES) IMPLANT
FIBER LASER FLEXIVA 365 (UROLOGICAL SUPPLIES) IMPLANT
FIBER LASER FLEXIVA 550 (UROLOGICAL SUPPLIES) IMPLANT
FIBER LASER TRAC TIP (UROLOGICAL SUPPLIES) ×1 IMPLANT
GLOVE BIOGEL M 8.0 STRL (GLOVE) ×2 IMPLANT
GLOVE SURG SS PI 6.5 STRL IVOR (GLOVE) ×1 IMPLANT
GOWN STRL REUS W/ TWL XL LVL3 (GOWN DISPOSABLE) ×1 IMPLANT
GOWN STRL REUS W/TWL XL LVL3 (GOWN DISPOSABLE) ×5 IMPLANT
GUIDEWIRE ANG ZIPWIRE 038X150 (WIRE) IMPLANT
GUIDEWIRE STR DUAL SENSOR (WIRE) ×1 IMPLANT
MANIFOLD NEPTUNE II (INSTRUMENTS) ×2 IMPLANT
PACK CYSTO (CUSTOM PROCEDURE TRAY) ×2 IMPLANT
SHEATH ACCESS URETERAL 38CM (SHEATH) ×1 IMPLANT
STENT CONTOUR 6FRX24X.038 (STENTS) ×1 IMPLANT
SYRINGE IRR TOOMEY STRL 70CC (SYRINGE) ×1 IMPLANT
TUBING CONNECTING 10 (TUBING) ×2 IMPLANT

## 2014-10-27 NOTE — Transfer of Care (Signed)
Immediate Anesthesia Transfer of Care Note  Patient: Tyler Mason  Procedure(s) Performed: Procedure(s): CYSTOSCOPY WITH RETROGRADE PYELOGRAM, HOLMIUM LASER, BASKET STONE EXTRACTION, LEFT URETEROSCOPY AND DOUBLE J STENT PLACEMENT (Left)  Patient Location: PACU  Anesthesia Type:General  Level of Consciousness: sedated  Airway & Oxygen Therapy: Patient Spontanous Breathing and Patient connected to face mask oxygen  Post-op Assessment: Report given to RN and Post -op Vital signs reviewed and stable  Post vital signs: Reviewed and stable  Last Vitals:  Filed Vitals:   10/27/14 0859  BP: 139/75  Pulse: 78  Temp: 36.6 C  Resp: 18    Complications: No apparent anesthesia complications

## 2014-10-27 NOTE — H&P (Signed)
[name] is a 64 year old male with a urologic history as below.  History of Present Illness Nephrolithiasis: he had bilateral ureteral calculi noted on a CT scan done on 05/16/10. We discussed ureteroscopic extraction of the stones and he was scheduled for surgery but passed both of his stones spontaneously. He has a history of having undergone prior stone manipulation by Dr. Gaynelle Arabian in 1995. His CT scan in 5/12 revealed bilateral nonobstructing nephrolithiasis as well. He then required ESL for right ureteral stone in 1/13.  Stone analysis: 60% calcium oxalate, 40% calcium phosphate.  KUB in 4/14 revealed minute densities overlying the left kidney possibly stones with no right renal calculi identified.  24 hour urine: He was found to have a very low volume of only 700 cc however his creatinine indicated incomplete collection.    History of UTI: He had an E. coli UTI in 2/14. His upper tract had been evaluated previously with CT scans and cystoscopy in 4/14 revealed no abnormality of the lower tract.    He presents today for management of left ureteral stones. CT scan indicates left ureteral lithiasis that actually may be 2 stones stacked on top of each other with hydronephrosis. There is also evidence of urinary tract infection but the patient is covered on culture appropriate Bactrim DS. He endorses continued frequency and urgency. He denies flank or back pain. He has not had fever or constitutional signs and symptoms of infection. He he has not seen gross hematuria, he feels he is emptying well.   Past Medical History Problems  1. History of depression (V11.8) 2. History of esophageal reflux (V12.79) 3. History of hypercholesterolemia (V12.29) 4. 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 History of Pulmonary Embolism (V12.55) 6. History of Venous Thrombosis Of The Deep Vessels Of The Lower Extremity (453.40)  Surgical History Problems  1. History  of Burr Holes For Evacuation Of Subdural Hematoma 2. History of Cataract Surgery 3. History of Cystoscopy With Manipulation Of Ureteral Calculus 4. History of Lithotripsy  Current Meds 1. Coumadin 5 MG Oral Tablet; pt takes daily;  Therapy: (Recorded:11May2012) to Recorded 2. Doxycycline Monohydrate 100 MG Oral Tablet; TAKE 1 TABLET TWICE DAILY;  Therapy: [phone] to (Evaluate:04Aug2015)  Requested for: [phone]; Last  Rx:28Jul2015 Ordered 3. Fish Oil 1000 MG Oral Capsule;  Therapy: (Recorded:11May2012) to Recorded 4. Phenazopyridine HCl - 200 MG Oral Tablet; TAKE 1 TABLET BY MOUTH 3 TIMES A DAY AS  NEEDED FORBURNING;  Therapy: [id number] to (Evaluate:27Jul2015)  Requested for: [id number]; Last  Rx:17Jul2015 Ordered 5. Protonix 40 MG Oral Tablet Delayed Release;  Therapy: (Recorded:07Apr2014) to Recorded 6. Remeron 30 MG Oral Tablet;  Therapy: (Recorded:11May2012) to Recorded 7. Zocor 20 MG Oral Tablet;  Therapy: (Recorded:19Mar2013) to Recorded 8. Zoloft TABS;  Therapy: (Recorded:16Jul2013) to Recorded  Allergies Medication  1. Nitrofurantoin CAPS 2. Ampicillin CAPS  Family History Problems  1. Family history of Acute Myocardial Infarction (V17.3) 2. Family history of Death In The Family Father : Father 3. Family history of Family Health Status - Mother's Age   60 4. Family history of Family Health Status Children ___ Living Sons   2 5. Family history of Hypertension (V17.49) 6. Family history of Prostate Cancer (V16.42) 7. Family history of Stroke Syndrome (V17.1)  Social History Problems  1. Denied: History of Alcohol Use 2. Denied: History of Caffeine Use 3. Former smoker (V15.82)   smoked 1 ppd for 25 yrsquit 1995 4. Marital History - Currently Married 5. Physical Disability Affecting  Ability To Work     Review of Systems Genitourinary, constitutional, skin, eye, otolaryngeal, hematologic/lymphatic, cardiovascular, pulmonary, endocrine, musculoskeletal,  gastrointestinal, neurological and psychiatric system(s) were reviewed and pertinent findings if present are noted.  Genitourinary: nocturia, weak urinary stream, urinary stream starts and stops, hematuria and penile pain.  Gastrointestinal: flank pain, abdominal pain and heartburn.  Hematologic/Lymphatic: a tendency to easily bruise.  Musculoskeletal: back pain.  Psychiatric: depression.    Vitals Vital Signs  Height: 5 ft 10 in Weight: 210 lb  BMI Calculated: 30.13 BSA Calculated: 2.13 Blood Pressure: 137 / 86 Heart Rate: 65  Physical Exam Constitutional: Well nourished and well developed . No acute distress.  ENT:. The ears and nose are normal in appearance.  Neck: The appearance of the neck is normal and no neck mass is present.  Pulmonary: No respiratory distress and normal respiratory rhythm and effort.  Cardiovascular: Heart rate and rhythm are normal . No peripheral edema.  Abdomen: The abdomen is soft and nontender. No masses are palpated. No CVA tenderness. No hernias are palpable. No hepatosplenomegaly noted.  Lymphatics: The femoral and inguinal nodes are not enlarged or tender.  Skin: Normal skin turgor, no visible rash and no visible skin lesions.  Neuro/Psych:. Mood and affect are appropriate.  Genitourinary: Examination of the penis demonstrates swelling, but no discharge, no masses, no lesions and a normal meatus. The penis is circumcised. The scrotum is without lesions. The right epididymis is palpably normal and non-tender. The left epididymis is palpably normal and non-tender. The right testis is non-tender and without masses. The left testis is non-tender and without masses.   Assessment He has left ureteral calculi.  Plan  I'm going to continue him on his Bactrim for an additional week. We discussed the management of urinary stones. These options include ureteroscopy and shockwave lithotripsy. We discussed which options are relevant to these particular stones.  We discussed the natural history of stones as well as the complications of untreated stones and the impact on quality of life without treatment as well as with each of the above listed treatments. We also discussed the efficacy of each treatment in its ability to clear the stone burden. With any of these management options I discussed the signs and symptoms of infection and the need for emergent treatment should these be experienced. For each option we discussed the ability of each procedure to clear the patient of their stone burden.    For ureteroscopy I described the risks which include heart attack, stroke, pulmonary embolus, death, bleeding, infection, damage to contiguous structures, positioning injury, ureteral stricture, ureteral avulsion, ureteral injury, need for ureteral stent, inability to perform ureteroscopy, need for an interval procedure, inability to clear stone burden, stent discomfort and pain.  For shockwave lithotripsy I described the risks which include arrhythmia, kidney contusion, kidney hemorrhage, need for transfusion, long-term risk of diabetes or hypertension, back discomfort, flank ecchymosis, flank abrasion, inability to break up stone, inability to pass stone fragments, Steinstrasse, infection associated with obstructing stones, need for different surgical procedure and possible need for repeat shockwave lithotripsy.    The patient has considered his options and would like to proceed with ureteroscopy and I will have him scheduled for such. AUS/ED f/u information regarding fever, uncontrollable pain, gross hematuria/urinary retention in the presence of new or worsening LUTS.

## 2014-10-27 NOTE — Anesthesia Postprocedure Evaluation (Signed)
  Anesthesia Post-op Note  Patient: Tyler Mason  Procedure(s) Performed: Procedure(s): CYSTOSCOPY WITH RETROGRADE PYELOGRAM, HOLMIUM LASER, BASKET STONE EXTRACTION, LEFT URETEROSCOPY AND DOUBLE J STENT PLACEMENT (Left)  Patient Location: PACU  Anesthesia Type:General  Level of Consciousness: alert  and oriented  Airway and Oxygen Therapy: Patient Spontanous Breathing  Post-op Pain: mild  Post-op Assessment: Post-op Vital signs reviewed and Patient's Cardiovascular Status Stable              Post-op Vital Signs: Reviewed and stable  Last Vitals:  Filed Vitals:   10/27/14 1304  BP:   Pulse: 73  Temp:   Resp: 14    Complications: No apparent anesthesia complications

## 2014-10-27 NOTE — Op Note (Signed)
PATIENT:  Tyler Mason  PRE-OPERATIVE DIAGNOSIS:  left Ureteral calculus  POST-OPERATIVE DIAGNOSIS: 1. Left ureteral calculus 2. Left renal calculi  PROCEDURE:  1. Cystoscopy with left retrograde pyelogram including interpretation. 2. Left ureteroscopy and laser lithotripsy 3. Left ureteroscopic ureteral stone extraction 4. Left renal stone extraction 5. Left double-J stent placement  SURGEON: Claybon Jabs, MD  INDICATION: Mr. Sandler is a 64 year old male who was noted to have what appeared to be 2 stones in the left proximal ureter with no progression in intermittent pain. He has elected proceed with ureteroscopic management. On his imaging studies he was noted to have nonobstructing left renal calculi as well.  ANESTHESIA:  General  EBL:  Minimal  DRAINS: 6 French, 24 cm double-J stent in the left ureter (with string)  SPECIMEN:  Stone given to patient  DESCRIPTION OF PROCEDURE: The patient was taken to the major OR and placed on the table. General anesthesia was administered and then the patient was moved to the dorsal lithotomy position. The genitalia was sterilely prepped and draped. An official timeout was performed.  Initially the 21 French cystoscope with 30 lens was passed under direct vision. The bladder was then entered and fully inspected. It was noted be free of any tumors stones or inflammatory lesions. Ureteral orifices were of normal configuration and position. A 6 French open-ended ureteral catheter was then passed through the cystoscope into the ureteral orifice in order to perform a left retrograde pyelogram.  A retrograde pyelogram was performed by injecting full-strength contrast up the left ureter under direct fluoroscopic control. It revealed a single filling defect in the distal ureter consistent with the stone seen on the preoperative KUB. The remainder of the ureter was noted to be normal as was the intrarenal collecting system. I then passed a 0.038 inch  floppy-tipped guidewire through the open ended catheter and into the area of the renal pelvis and this was left in place. The inner portion of a ureteral access sheath was then passed over the guidewire to gently dilate the intramural ureter. I then proceeded with ureteroscopy.  A 6 French rigid ureteroscope was then passed under direct into the bladder and into the left orifice and up the ureter. The stone was identified and I felt it was too large to extract and therefore elected to proceed with laser lithotripsy. The 200  holmium laser fiber was used to fragment the stone. I then used the nitinol basket to extract all of the stone fragments and reinspection of the ureter ureteroscopically revealed no further stone fragments and no injury to the ureter.  Because he appeared to have 2 stones I advanced the rigid ureteroscope back up the left ureter to nearly the ureteropelvic junction but saw no other calculi. I therefore passed the guidewire through the ureteroscope and left this in place and removed the rigid ureteroscope. Over the guidewire I then passed the ureteral access sheath to a location just below the ureteropelvic junction and removed the inner portion as well as guidewire. I passed the flexible, digital ureteroscope through the access sheath and then on into the kidney and noted no stones within the area of the UPJ. I then did some a systematic inspection of each of the calyces in the left kidney. I found 2 stones adherent to the renal papilla 1 in the lower pole and one in the middle to upper pole. No other free-floating stones were noted within the kidney. Because of the presence of these 2 stones which  could potentially serve as a nidus for further stone formation I felt removal was indicated. I therefore used the nitinol basket to engage each of the stones and remove them without difficulty. Reinspection revealed no further stones within the kidney and I therefore backed the flexible  ureteroscope down the ureter under direct visualization and noted no further stones. Before removing the ureteroscope from the ureter I passed a guidewire back into the area the renal pelvis under fluoroscopy and left this in place removing the ureteroscope.   I then backloaded the cystoscope over the guidewire and passed the stent over the guidewire into the area of the renal pelvis. As the guidewire was removed good curl was noted in the renal pelvis. The bladder was drained and the cystoscope was then removed. The patient tolerated the procedure well no intraoperative complications.  PLAN OF CARE: Discharge to home after PACU  PATIENT DISPOSITION:  PACU - hemodynamically stable.

## 2014-10-27 NOTE — Discharge Instructions (Signed)

## 2014-10-29 ENCOUNTER — Encounter (HOSPITAL_COMMUNITY): Payer: Self-pay | Admitting: Emergency Medicine

## 2014-10-29 ENCOUNTER — Emergency Department (HOSPITAL_COMMUNITY): Payer: 59

## 2014-10-29 ENCOUNTER — Emergency Department (HOSPITAL_COMMUNITY)
Admission: EM | Admit: 2014-10-29 | Discharge: 2014-10-29 | Disposition: A | Discharge status: Home / Self Care | Payer: 59 | Attending: Emergency Medicine | Admitting: Emergency Medicine

## 2014-10-29 DIAGNOSIS — T83122A Displacement of urinary stent, initial encounter: Secondary | ICD-10-CM | POA: Diagnosis not present

## 2014-10-29 DIAGNOSIS — Y752 Prosthetic and other implants, materials and neurological devices associated with adverse incidents: Secondary | ICD-10-CM | POA: Insufficient documentation

## 2014-10-29 DIAGNOSIS — Z87442 Personal history of urinary calculi: Secondary | ICD-10-CM | POA: Diagnosis not present

## 2014-10-29 DIAGNOSIS — Z8679 Personal history of other diseases of the circulatory system: Secondary | ICD-10-CM | POA: Insufficient documentation

## 2014-10-29 DIAGNOSIS — Z7901 Long term (current) use of anticoagulants: Secondary | ICD-10-CM | POA: Diagnosis not present

## 2014-10-29 DIAGNOSIS — Z862 Personal history of diseases of the blood and blood-forming organs and certain disorders involving the immune mechanism: Secondary | ICD-10-CM | POA: Insufficient documentation

## 2014-10-29 DIAGNOSIS — F329 Major depressive disorder, single episode, unspecified: Secondary | ICD-10-CM | POA: Diagnosis not present

## 2014-10-29 DIAGNOSIS — Z8719 Personal history of other diseases of the digestive system: Secondary | ICD-10-CM | POA: Diagnosis not present

## 2014-10-29 DIAGNOSIS — Z86718 Personal history of other venous thrombosis and embolism: Secondary | ICD-10-CM | POA: Diagnosis not present

## 2014-10-29 DIAGNOSIS — Z79899 Other long term (current) drug therapy: Secondary | ICD-10-CM | POA: Insufficient documentation

## 2014-10-29 DIAGNOSIS — Y998 Other external cause status: Secondary | ICD-10-CM | POA: Diagnosis not present

## 2014-10-29 DIAGNOSIS — Z8701 Personal history of pneumonia (recurrent): Secondary | ICD-10-CM | POA: Insufficient documentation

## 2014-10-29 DIAGNOSIS — Z87891 Personal history of nicotine dependence: Secondary | ICD-10-CM | POA: Diagnosis not present

## 2014-10-29 DIAGNOSIS — Z86711 Personal history of pulmonary embolism: Secondary | ICD-10-CM | POA: Insufficient documentation

## 2014-10-29 DIAGNOSIS — Y9389 Activity, other specified: Secondary | ICD-10-CM | POA: Diagnosis not present

## 2014-10-29 DIAGNOSIS — Z87440 Personal history of urinary (tract) infections: Secondary | ICD-10-CM | POA: Insufficient documentation

## 2014-10-29 DIAGNOSIS — R32 Unspecified urinary incontinence: Secondary | ICD-10-CM | POA: Diagnosis not present

## 2014-10-29 NOTE — ED Notes (Signed)
Dr. Gaynelle Arabian notified of patient's arrival

## 2014-10-29 NOTE — ED Provider Notes (Addendum)
CSN: [id number]     Arrival date & time 10/29/14  1037 History   First MD Initiated Contact with Patient 10/29/14 1039     Chief Complaint  Patient presents with  . Urinary Incontinence     (Consider location/radiation/quality/duration/timing/severity/associated sxs/prior Treatment) HPI Comments: Patient had ureteral stent placed 2 days ago for left-sided kidney stone. Last night when in the shower he got pain in the string and it got pulled out a significant amount. Since that time patient has been leaking urine. He denies any pain or fever. He spoke with Dr. Gaynelle Arabian who recommended he come in and get an x-ray  The history is provided by the patient and the spouse.    Past Medical History  Diagnosis Date  . GERD (gastroesophageal reflux disease)   . Depression   . Blood dyscrasia     patient on chronic coumadin  . Kidney calculus   . SDH (subdural hematoma) ([place])   . DVT (deep venous thrombosis) ([place])   . Pneumonia     HX OF PNA  . PE (pulmonary embolism)     HX  OF PE  . Headache(784.0)   . UTI (urinary tract infection)     FREQUENT   Past Surgical History  Procedure Laterality Date  . Brain surgery      head injury from tree limb   . Colonoscopy    . Orif ankle fracture Right 03/09/2013    DR GRAVES  . Lithotripsy    . Orif ankle fracture Right 03/09/2013    Procedure: OPEN REDUCTION INTERNAL FIXATION (ORIF) ANKLE FRACTURE right;  Surgeon: Alta Corning, MD;  Location: [place];  Service: Orthopedics;  Laterality: Right;  . Cystoscopy with retrograde pyelogram, ureteroscopy and stent placement Left 10/27/2014    Procedure: CYSTOSCOPY WITH RETROGRADE PYELOGRAM, HOLMIUM LASER, BASKET STONE EXTRACTION, LEFT URETEROSCOPY AND DOUBLE J STENT PLACEMENT;  Surgeon: Kathie Rhodes, MD;  Location: [facility];  Service: Urology;  Laterality: Left;   Family History  Problem Relation Age of Onset  . Hypertension Mother   . Thyroid disease Mother    Social History  Substance Use  Topics  . Smoking status: Former Smoker -- 1.00 packs/day    Quit date: 01/01/2001  . Smokeless tobacco: Never Used  . Alcohol Use: No    Review of Systems  All other systems reviewed and are negative.     Allergies  Ampicillin and Macrodantin  Home Medications   Prior to Admission medications   Medication Sig Start Date End Date Taking? Authorizing Provider  Oxycodone HCl 10 MG TABS Take 1 tablet (10 mg total) by mouth every 4 (four) hours as needed. 10/27/14  Yes Kathie Rhodes, MD  mirtazapine (REMERON) 30 MG tablet Take 30 mg by mouth at bedtime.    Historical Provider, MD  naproxen sodium (ANAPROX) 220 MG tablet Take 220 mg by mouth 2 (two) times daily as needed. Pain      Historical Provider, MD  Omega-3 Fatty Acids (FISH OIL) 1200 MG CAPS Take 1 capsule by mouth 2 (two) times daily.      Historical Provider, MD  oxyCODONE-acetaminophen (PERCOCET/ROXICET) 5-325 MG per tablet Take 1-2 tablets by mouth every 6 (six) hours as needed for severe pain. Patient not taking: Reported on 10/29/2014 03/09/13   [name], PA-C  phenazopyridine (PYRIDIUM) 200 MG tablet Take 1 tablet (200 mg total) by mouth 3 (three) times daily as needed for pain. 10/27/14   Kathie Rhodes, MD  sertraline (ZOLOFT) 50 MG  tablet Take 50 mg by mouth daily after breakfast.      Historical Provider, MD  simvastatin (ZOCOR) 20 MG tablet Take 20 mg by mouth at bedtime.      Historical Provider, MD  tamsulosin (FLOMAX) 0.4 MG CAPS capsule Take 1 capsule (0.4 mg total) by mouth daily after supper. 10/27/14   Kathie Rhodes, MD  warfarin (COUMADIN) 5 MG tablet Take 5-7.5 mg by mouth daily. (7.5mg )1.5 tablet Friday and Sunday (5mg ) tablet all other days    Historical Provider, MD   BP 136/67 mmHg  Pulse 86  Temp(Src) 98.3 F (36.8 C) (Oral)  Resp 16  SpO2 100% Physical Exam  Constitutional: He is oriented to person, place, and time. He appears well-developed and well-nourished. No distress.  HENT:  Head:  Normocephalic and atraumatic.  Mouth/Throat: Oropharynx is clear and moist.  Eyes: Conjunctivae and EOM are normal. Pupils are equal, round, and reactive to light.  Neck: Normal range of motion. Neck supple.  Cardiovascular: Normal rate, regular rhythm and intact distal pulses.   No murmur heard. Pulmonary/Chest: Effort normal and breath sounds normal. No respiratory distress. He has no wheezes. He has no rales.  Abdominal: Soft. He exhibits no distension. There is no tenderness. There is no rebound and no guarding.  Musculoskeletal: Normal range of motion. He exhibits no edema or tenderness.  Neurological: He is alert and oriented to person, place, and time.  Skin: Skin is warm and dry. No rash noted. No erythema.  Psychiatric: He has a normal mood and affect. His behavior is normal.  Nursing note and vitals reviewed.   ED Course  Procedures (including critical care time) Labs Review Labs Reviewed - No data to display  Imaging Review Dg Abd 1 View  10/29/2014  CLINICAL DATA:  Recently placed left urinary stent with possible dislodgement. Now with urinary continence. EXAM: ABDOMEN - 1 VIEW COMPARISON:  None. FINDINGS: A left urinary stent is identified with tip overlying the mid left ureter. No visible calcifications are noted along the course of the ureters. The bowel gas pattern is unremarkable. A 1.3 cm right renal calculus is again noted. IMPRESSION: Left urinary stent with tip overlying the mid left ureter. Electronically Signed   By: Margarette Canada M.D.   On: 10/29/2014 11:27   I have personally reviewed and evaluated these images and lab results as part of my medical decision-making.   EKG Interpretation None      MDM   Final diagnoses:  Ureteral stent displacement, initial encounter [facility])    Patient is here with recent stents placement 2 days ago by Dr. Karsten Ro who accidentally pulled the stent string last night and now has now had incontinence. KUB pending to evaluate  where the stent is.  11:43 AM Stent removed by Dr. Gaynelle Arabian and pt d/ced home.   Blanchie Dessert, MD 10/29/14 1144  Blanchie Dessert, MD 10/29/14 1145

## 2014-10-29 NOTE — ED Notes (Signed)
Per pt/Dr.Tannenbaum, states he had kidney surgery on Friday and had stent placed on left-accidentally pulled on suture and possibly dislodged stent-now having urinary incontinence-here for KUB

## 2014-10-29 NOTE — Consult Note (Signed)
Urology Consult  Referring physician:   Blanchie Dessert, MD Reason for referral:  Urinary Incontinence CC: Dr. Kathie Rhodes History of Present Illness:   Tyler Mason is a 64 yo married male. Post Left ureteral stone extraction 3 days ago, with JJ stent left inplace with suture attached. Procedure per Dr. Karsten Ro reviewed, and note LL ureteral stone identified, requiring laser and basket extraction,and also renal pyeloscopy  Performed with basket extraction of 2 renal pelvic stones. JJ stent left in place, with suture attached, and was to be removed Nov. 3rd, but pt inadvertently pulled on suture during sleep overnight, and now complains of constant urinary incontinence.   Past Medical History  Diagnosis Date  . GERD (gastroesophageal reflux disease)   . Depression   . Blood dyscrasia     patient on chronic coumadin  . Kidney calculus   . SDH (subdural hematoma) ([place])   . DVT (deep venous thrombosis) ([place])   . Pneumonia     HX OF PNA  . PE (pulmonary embolism)     HX  OF PE  . Headache(784.0)   . UTI (urinary tract infection)     FREQUENT   Past Surgical History  Procedure Laterality Date  . Brain surgery      head injury from tree limb   . Colonoscopy    . Orif ankle fracture Right 03/09/2013    DR GRAVES  . Lithotripsy    . Orif ankle fracture Right 03/09/2013    Procedure: OPEN REDUCTION INTERNAL FIXATION (ORIF) ANKLE FRACTURE right;  Surgeon: Alta Corning, MD;  Location: [place];  Service: Orthopedics;  Laterality: Right;  . Cystoscopy with retrograde pyelogram, ureteroscopy and stent placement Left 10/27/2014    Procedure: CYSTOSCOPY WITH RETROGRADE PYELOGRAM, HOLMIUM LASER, BASKET STONE EXTRACTION, LEFT URETEROSCOPY AND DOUBLE J STENT PLACEMENT;  Surgeon: Kathie Rhodes, MD;  Location: [facility];  Service: Urology;  Laterality: Left;    Medications: I have reviewed the patient's current medications.  Allergies:  Allergies  Allergen Reactions  . Ampicillin Other (See Comments)     Made tongue sore Has patient had a PCN reaction causing immediate rash, facial/tongue/throat swelling, SOB or lightheadedness with hypotension: no Has patient had a PCN reaction causing severe rash involving mucus membranes or skin necrosis: no Has patient had a PCN reaction that required hospitalization no Has patient had a PCN reaction occurring within the last 10 years: no If all of the above answers are "NO", then may proceed with Cephalosporin use.   Clancy Gourd [Nitrofurantoin Macrocrystal] Itching, Swelling and Rash    Family History  Problem Relation Age of Onset  . Hypertension Mother   . Thyroid disease Mother     Social History:  reports that he quit smoking about 13 years ago. He has never used smokeless tobacco. He reports that he does not drink alcohol or use illicit drugs.  ROS  Physical Exam:  Vital signs in last 24 hours: Temp:  [98.3 F (36.8 C)] 98.3 F (36.8 C) (10/23 1047) Pulse Rate:  [86] 86 (10/23 1046) Resp:  [16] 16 (10/23 1046) BP: (136)/(67) 136/67 mmHg (10/23 1046) SpO2:  [100 %] 100 % (10/23 1046) Physical Exam: Normal penis. Normal scrotum.   Laboratory Data:  Results for orders placed or performed during the hospital encounter of 10/27/14 (from the past 72 hour(s))  Protime-INR     Status: Abnormal   Collection Time: 10/27/14  9:30 AM  Result Value Ref Range   Prothrombin Time 15.3 (H) 11.6 -  15.2 seconds   INR 1.19 0.00 - 1.49   No results found for this or any previous visit (from the past 240 hour(s)). Creatinine: No results for input(s): CREATININE in the last 168 hours. Baseline Creatinine: : normal penis KUB:  CLINICAL DATA: Recently placed left urinary stent with possible dislodgement. Now with urinary continence.  EXAM: ABDOMEN - 1 VIEW  COMPARISON: None.  FINDINGS: A left urinary stent is identified with tip overlying the mid left ureter.  No visible calcifications are noted along the course of the  ureters.  The bowel gas pattern is unremarkable.  A 1.3 cm right renal calculus is again noted.  IMPRESSION: Left urinary stent with tip overlying the mid left ureter.   Electronically Signed  By: Margarette Canada M.D.  On: 10/29/2014 11:27  Impression/Assessment:  KUB shows that stent has been pulled down into the mid- urethra, at the level of the sphincter. That is why pt is having total incontinence.    Suture is pulled and JJ removed.  I have discussed JJ removal with the pt. and his wife. He will call Dr. Karsten Ro tomorrow. I do not think he will suffer any complication from stent removal.   Plan:  Stent is removed. Pt to call Dr. Karsten Ro tomorrow.   Darean Rote I Chey Rachels 10/29/2014, 11:49 AM

## 2014-11-14 DIAGNOSIS — N201 Calculus of ureter: Secondary | ICD-10-CM | POA: Diagnosis not present

## 2014-11-17 DIAGNOSIS — R197 Diarrhea, unspecified: Secondary | ICD-10-CM | POA: Diagnosis not present

## 2014-11-17 DIAGNOSIS — K625 Hemorrhage of anus and rectum: Secondary | ICD-10-CM | POA: Diagnosis not present

## 2014-11-17 DIAGNOSIS — K640 First degree hemorrhoids: Secondary | ICD-10-CM | POA: Diagnosis not present

## 2014-11-17 DIAGNOSIS — Z86010 Personal history of colonic polyps: Secondary | ICD-10-CM | POA: Diagnosis not present

## 2014-12-08 DIAGNOSIS — N200 Calculus of kidney: Secondary | ICD-10-CM | POA: Diagnosis not present

## 2014-12-08 DIAGNOSIS — N201 Calculus of ureter: Secondary | ICD-10-CM | POA: Diagnosis not present

## 2014-12-15 DIAGNOSIS — R300 Dysuria: Secondary | ICD-10-CM | POA: Diagnosis not present

## 2014-12-21 DIAGNOSIS — R197 Diarrhea, unspecified: Secondary | ICD-10-CM | POA: Diagnosis not present

## 2014-12-21 DIAGNOSIS — K625 Hemorrhage of anus and rectum: Secondary | ICD-10-CM | POA: Diagnosis not present

## 2014-12-21 DIAGNOSIS — Z86010 Personal history of colonic polyps: Secondary | ICD-10-CM | POA: Diagnosis not present

## 2015-01-02 ENCOUNTER — Other Ambulatory Visit: Payer: Self-pay | Admitting: Gastroenterology

## 2015-01-10 DIAGNOSIS — Z7901 Long term (current) use of anticoagulants: Secondary | ICD-10-CM | POA: Diagnosis not present

## 2015-01-16 MED FILL — SIMVASTATIN 20 MG TABLET: 20 | 90 days supply | Qty: 90 | Fill #2

## 2015-01-17 DIAGNOSIS — Z7901 Long term (current) use of anticoagulants: Secondary | ICD-10-CM | POA: Diagnosis not present

## 2015-01-17 MED FILL — CHOLESTYRAMINE PACKET: 4 | 30 days supply | Qty: 60 | Fill #0

## 2015-01-29 MED FILL — SERTRALINE HCL 50 MG TABLET: 50 | 90 days supply | Qty: 90 | Fill #1

## 2015-02-02 DIAGNOSIS — K640 First degree hemorrhoids: Secondary | ICD-10-CM | POA: Diagnosis not present

## 2015-02-08 DIAGNOSIS — E538 Deficiency of other specified B group vitamins: Secondary | ICD-10-CM | POA: Diagnosis not present

## 2015-02-08 DIAGNOSIS — K219 Gastro-esophageal reflux disease without esophagitis: Secondary | ICD-10-CM | POA: Diagnosis not present

## 2015-02-08 DIAGNOSIS — E782 Mixed hyperlipidemia: Secondary | ICD-10-CM | POA: Diagnosis not present

## 2015-02-08 DIAGNOSIS — I82401 Acute embolism and thrombosis of unspecified deep veins of right lower extremity: Secondary | ICD-10-CM | POA: Diagnosis not present

## 2015-02-08 DIAGNOSIS — Z1389 Encounter for screening for other disorder: Secondary | ICD-10-CM | POA: Diagnosis not present

## 2015-02-08 DIAGNOSIS — N200 Calculus of kidney: Secondary | ICD-10-CM | POA: Diagnosis not present

## 2015-02-08 DIAGNOSIS — I2699 Other pulmonary embolism without acute cor pulmonale: Secondary | ICD-10-CM | POA: Diagnosis not present

## 2015-02-08 DIAGNOSIS — Z0000 Encounter for general adult medical examination without abnormal findings: Secondary | ICD-10-CM | POA: Diagnosis not present

## 2015-02-08 DIAGNOSIS — F324 Major depressive disorder, single episode, in partial remission: Secondary | ICD-10-CM | POA: Diagnosis not present

## 2015-02-09 MED FILL — PANTOPRAZOLE SOD DR 40 MG T: 40 | 90 days supply | Qty: 90 | Fill #0

## 2015-02-15 DIAGNOSIS — Z7901 Long term (current) use of anticoagulants: Secondary | ICD-10-CM | POA: Diagnosis not present

## 2015-02-15 MED FILL — CHOLESTYRAMINE PACKET: 4 | 30 days supply | Qty: 60 | Fill #1

## 2015-02-17 ENCOUNTER — Emergency Department (INDEPENDENT_AMBULATORY_CARE_PROVIDER_SITE_OTHER)
Admission: EM | Admit: 2015-02-17 | Discharge: 2015-02-17 | Disposition: A | Discharge status: Home / Self Care | Payer: 59 | Source: Home / Self Care | Attending: Family Medicine | Admitting: Family Medicine

## 2015-02-17 ENCOUNTER — Encounter (HOSPITAL_COMMUNITY): Payer: Self-pay | Admitting: Emergency Medicine

## 2015-02-17 ENCOUNTER — Other Ambulatory Visit (HOSPITAL_COMMUNITY)
Admission: RE | Admit: 2015-02-17 | Discharge: 2015-02-17 | Disposition: A | Discharge status: Home / Self Care | Payer: 59 | Source: Ambulatory Visit | Attending: Family Medicine | Admitting: Family Medicine

## 2015-02-17 DIAGNOSIS — R6883 Chills (without fever): Secondary | ICD-10-CM | POA: Insufficient documentation

## 2015-02-17 DIAGNOSIS — N390 Urinary tract infection, site not specified: Secondary | ICD-10-CM | POA: Diagnosis not present

## 2015-02-17 LAB — POCT URINALYSIS DIP (DEVICE)
Bilirubin Urine: NEGATIVE
Glucose, UA: NEGATIVE mg/dL
Ketones, ur: NEGATIVE mg/dL
Nitrite: POSITIVE — AB
Protein, ur: 30 mg/dL — AB
Specific Gravity, Urine: 1.025 (ref 1.005–1.030)
Urobilinogen, UA: 0.2 mg/dL (ref 0.0–1.0)
pH: 5.5 (ref 5.0–8.0)

## 2015-02-17 MED ORDER — CIPROFLOXACIN HCL 500 MG PO TABS
500.0000 mg | ORAL_TABLET | Freq: Two times a day (BID) | ORAL | Status: DC
Start: 2015-02-17 — End: 2016-02-11

## 2015-02-17 MED ORDER — CIPROFLOXACIN HCL 500 MG PO TABS: 500.0000 mg | ORAL_TABLET | Freq: Two times a day (BID) | ORAL | Status: DC

## 2015-02-17 NOTE — ED Notes (Signed)
C/o chills onset 2/6 associated w/dry cough and polydipsia  A&O x4... No acute distress.

## 2015-02-17 NOTE — Discharge Instructions (Signed)
Antibiotic Medicine °Antibiotic medicines are used to treat infections caused by bacteria. They work by injuring or killing the bacteria that is making you sick. °HOW IS AN ANTIBIOTIC CHOSEN? °An antibiotic is chosen based on many factors. To help your health care provider choose one for you, tell your health care provider if: °· You have any allergies. °· You are pregnant or plan to get pregnant. °· You are breastfeeding. °· You are taking any medicines. These include over-the-counter medicines, prescription medicines, and herbal remedies. °· You have a medical condition or problem you have not already discussed. °Your health care provider will also consider: °· How often the medicine has to be taken. °· Common side effects of the medicine. °· The cost of the medicine. °· The taste of the medicine. °If you have questions about why an antibiotic was chosen, make sure to ask. °FOR HOW LONG SHOULD I TAKE MY ANTIBIOTIC? °Continue to take your antibiotic for as long as told by your health care provider. Do not stop taking it when you feel better. If you stop taking it too soon: °· You may start to feel sick again. °· Your infection may become harder to treat. °· Complications may develop. °WHAT IF I MISS A DOSE? °Try not to miss any doses of medicine. If you miss a dose, take it as soon as possible. However, if it is almost time for the next dose: °· If you are taking 2 doses per day, take the missed dose and the next dose 5 to 6 hours apart. °· If you are taking 3 or more doses per day, take the missed dose and the next dose 2 to 4 hours apart, then go back to the normal schedule. °If you cannot make up a missed dose, take the next scheduled dose on time. Then take the missed dose after you have taken all the doses as recommended by your health care provider, as if you had one more dose left. °DO ANTIBIOTICS AFFECT BIRTH CONTROL? °Birth control pills may not work while you are on antibiotics. If you are taking birth  control pills, continue taking them as usual and use a second form of birth control, such as a condom, to avoid unwanted pregnancy. Continue using the second form of birth control until you are finished with your current 1 month cycle of birth control pills. °OTHER INFORMATION °· If there is any medicine left over, throw it away. °· Never take someone else's antibiotics. °· Never take leftover antibiotics. °SEEK MEDICAL CARE IF: °· You get worse. °· You do not feel better within a few days of starting the antibiotic medicine. °· You vomit. °· White patches appear in your mouth. °· You have new joint pain that begins after starting the antibiotic. °· You have new muscle aches that begin after starting the antibiotic. °· You had a fever before starting the antibiotic and it returns. °· You have any symptoms of an allergic reaction, such as an itchy rash. If this happens, stop taking the antibiotic. °SEEK IMMEDIATE MEDICAL CARE IF: °· Your urine turns dark or becomes blood-colored. °· Your skin turns yellow. °· You bruise or bleed easily. °· You have severe diarrhea and abdominal cramps. °· You have a severe headache. °· You have signs of a severe allergic reaction, such as: °¨ Trouble breathing. °¨ Wheezing. °¨ Swelling of the lips, tongue, or face. °¨ Fainting. °¨ Blisters on the skin or in the mouth. °If you have signs of a severe allergic   reaction, stop taking the antibiotic right away. °  °This information is not intended to replace advice given to you by your health care provider. Make sure you discuss any questions you have with your health care provider. °  °Document Released: 09/05/2003 Document Revised: 09/13/2014 Document Reviewed: 05/10/2014 °Elsevier Interactive Patient Education ©2016 [employer] ° °Urinary Tract Infection °Urinary tract infections (UTIs) can develop anywhere along your urinary tract. Your urinary tract is your body's drainage system for removing wastes and extra water. Your urinary  tract includes two kidneys, two ureters, a bladder, and a urethra. Your kidneys are a pair of bean-shaped organs. Each kidney is about the size of your fist. They are located below your ribs, one on each side of your spine. °CAUSES °Infections are caused by microbes, which are microscopic organisms, including fungi, viruses, and bacteria. These organisms are so small that they can only be seen through a microscope. Bacteria are the microbes that most commonly cause UTIs. °SYMPTOMS  °Symptoms of UTIs may vary by age and gender of the patient and by the location of the infection. Symptoms in young women typically include a frequent and intense urge to urinate and a painful, burning feeling in the bladder or urethra during urination. Older women and men are more likely to be tired, shaky, and weak and have muscle aches and abdominal pain. A fever may mean the infection is in your kidneys. Other symptoms of a kidney infection include pain in your back or sides below the ribs, nausea, and vomiting. °DIAGNOSIS °To diagnose a UTI, your caregiver will ask you about your symptoms. Your caregiver will also ask you to provide a urine sample. The urine sample will be tested for bacteria and white blood cells. White blood cells are made by your body to help fight infection. °TREATMENT  °Typically, UTIs can be treated with medication. Because most UTIs are caused by a bacterial infection, they usually can be treated with the use of antibiotics. The choice of antibiotic and length of treatment depend on your symptoms and the type of bacteria causing your infection. °HOME CARE INSTRUCTIONS °· If you were prescribed antibiotics, take them exactly as your caregiver instructs you. Finish the medication even if you feel better after you have only taken some of the medication. °· Drink enough water and fluids to keep your urine clear or pale yellow. °· Avoid caffeine, tea, and carbonated beverages. They tend to irritate your  bladder. °· Empty your bladder often. Avoid holding urine for long periods of time. °· Empty your bladder before and after sexual intercourse. °· After a bowel movement, women should cleanse from front to back. Use each tissue only once. °SEEK MEDICAL CARE IF:  °· You have back pain. °· You develop a fever. °· Your symptoms do not begin to resolve within 3 days. °SEEK IMMEDIATE MEDICAL CARE IF:  °· You have severe back pain or lower abdominal pain. °· You develop chills. °· You have nausea or vomiting. °· You have continued burning or discomfort with urination. °MAKE SURE YOU:  °· Understand these instructions. °· Will watch your condition. °· Will get help right away if you are not doing well or get worse. °  °This information is not intended to replace advice given to you by your health care provider. Make sure you discuss any questions you have with your health care provider. °  °Document Released: 10/02/2004 Document Revised: 09/13/2014 Document Reviewed: 01/31/2011 °Elsevier Interactive Patient Education ©2016 Elsevier Inc. ° °
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
## 2015-02-20 LAB — URINE CULTURE
Culture: >=100000
Special Requests: NORMAL

## 2015-02-22 DIAGNOSIS — Z7901 Long term (current) use of anticoagulants: Secondary | ICD-10-CM | POA: Diagnosis not present

## 2015-02-27 ENCOUNTER — Telehealth (HOSPITAL_COMMUNITY): Payer: Self-pay | Admitting: Emergency Medicine

## 2015-02-27 NOTE — ED Notes (Signed)
x1 attempt LM on pt's VM [phone]... Also called [phone] and spoke w/wife... Wife states he is feeling much better on medication Need to give lab results from recent visit on 2/11 Asked wife to have pt call back.   Per Dr. Valere Dross,  Urine culture growing E coli sensitive to cipro. Patient received prescription for cipro at urgent care visit 02/17/15.  Finish cipro. Recheck or followup pcp/Karrar Lysle Rubens for persistent symptoms. Jodi Mourning MD  Will try later.

## 2015-03-05 MED FILL — MIRTAZAPINE 30 MG TABLET: 30 | 90 days supply | Qty: 90 | Fill #2

## 2015-03-05 NOTE — ED Notes (Signed)
x2 attempt LM on pt's VM [phone]... Also called [phone] and spoke w/wife... Wife states he is feeling much better on medication Need to give lab results from recent visit on 2/11 Asked wife to have pt call back.   Per Dr. Valere Dross,  Urine culture growing E coli sensitive to cipro. Patient received prescription for cipro at urgent care visit 02/17/15.  Finish cipro. Recheck or followup pcp/Karrar Lysle Rubens for persistent symptoms. Jodi Mourning MD  Will try later.

## 2015-03-08 DIAGNOSIS — Z7901 Long term (current) use of anticoagulants: Secondary | ICD-10-CM | POA: Diagnosis not present

## 2015-03-19 MED FILL — CHOLESTYRAMINE PACKET: 4 | 30 days supply | Qty: 60 | Fill #0

## 2015-03-22 DIAGNOSIS — F330 Major depressive disorder, recurrent, mild: Secondary | ICD-10-CM | POA: Diagnosis not present

## 2015-03-22 DIAGNOSIS — Z7901 Long term (current) use of anticoagulants: Secondary | ICD-10-CM | POA: Diagnosis not present

## 2015-03-22 DIAGNOSIS — F0632 Mood disorder due to known physiological condition with major depressive-like episode: Secondary | ICD-10-CM | POA: Diagnosis not present

## 2015-03-22 MED FILL — WARFARIN SODIUM 5 MG TABLET: 5 | 90 days supply | Qty: 135 | Fill #2

## 2015-04-05 DIAGNOSIS — Z7901 Long term (current) use of anticoagulants: Secondary | ICD-10-CM | POA: Diagnosis not present

## 2015-04-16 MED FILL — SIMVASTATIN 20 MG TABLET: 20 | 90 days supply | Qty: 90 | Fill #3

## 2015-04-19 DIAGNOSIS — Z7901 Long term (current) use of anticoagulants: Secondary | ICD-10-CM | POA: Diagnosis not present

## 2015-04-30 MED FILL — SERTRALINE HCL 50 MG TABLET: 50 | 90 days supply | Qty: 90 | Fill #2

## 2015-05-03 DIAGNOSIS — Z7901 Long term (current) use of anticoagulants: Secondary | ICD-10-CM | POA: Diagnosis not present

## 2015-05-08 MED FILL — PANTOPRAZOLE SOD DR 40 MG T: 40 | 90 days supply | Qty: 90 | Fill #1

## 2015-05-24 DIAGNOSIS — Z7901 Long term (current) use of anticoagulants: Secondary | ICD-10-CM | POA: Diagnosis not present

## 2015-05-24 MED FILL — CHOLESTYRAMINE PACKET: 4 | 30 days supply | Qty: 60 | Fill #1

## 2015-06-05 MED FILL — MIRTAZAPINE 30 MG TABLET: 30 | 90 days supply | Qty: 90 | Fill #0

## 2015-06-07 DIAGNOSIS — Z7901 Long term (current) use of anticoagulants: Secondary | ICD-10-CM | POA: Diagnosis not present

## 2015-06-12 DIAGNOSIS — N200 Calculus of kidney: Secondary | ICD-10-CM | POA: Diagnosis not present

## 2015-06-28 DIAGNOSIS — Z7901 Long term (current) use of anticoagulants: Secondary | ICD-10-CM | POA: Diagnosis not present

## 2015-07-16 MED FILL — SIMVASTATIN 20 MG TABLET: 20 | 90 days supply | Qty: 90 | Fill #4

## 2015-07-16 MED FILL — WARFARIN SODIUM 5 MG TABLET: 5 | 90 days supply | Qty: 135 | Fill #0

## 2015-07-25 DIAGNOSIS — Z7901 Long term (current) use of anticoagulants: Secondary | ICD-10-CM | POA: Diagnosis not present

## 2015-07-26 MED FILL — CHOLESTYRAMINE PACKET: 4 | 30 days supply | Qty: 60 | Fill #0

## 2015-07-27 MED FILL — SERTRALINE HCL 50 MG TABLET: 50 | 90 days supply | Qty: 90 | Fill #0

## 2015-07-27 MED FILL — PANTOPRAZOLE SOD DR 40 MG T: 40 | 90 days supply | Qty: 90 | Fill #2

## 2015-08-22 DIAGNOSIS — Z7901 Long term (current) use of anticoagulants: Secondary | ICD-10-CM | POA: Diagnosis not present

## 2015-09-03 MED FILL — MIRTAZAPINE 30 MG TABLET: 30 | 90 days supply | Qty: 90 | Fill #1

## 2015-09-17 ENCOUNTER — Emergency Department (HOSPITAL_COMMUNITY): Payer: 59

## 2015-09-17 ENCOUNTER — Encounter (HOSPITAL_COMMUNITY): Payer: Self-pay

## 2015-09-17 ENCOUNTER — Observation Stay (HOSPITAL_COMMUNITY)
Admission: EM | Admit: 2015-09-17 | Discharge: 2015-09-18 | Disposition: A | Discharge status: Home / Self Care | Payer: 59 | Attending: Surgery | Admitting: Surgery

## 2015-09-17 DIAGNOSIS — S0003XA Contusion of scalp, initial encounter: Secondary | ICD-10-CM | POA: Diagnosis not present

## 2015-09-17 DIAGNOSIS — S279XXA Injury of unspecified intrathoracic organ, initial encounter: Secondary | ICD-10-CM | POA: Diagnosis not present

## 2015-09-17 DIAGNOSIS — Z86718 Personal history of other venous thrombosis and embolism: Secondary | ICD-10-CM | POA: Diagnosis not present

## 2015-09-17 DIAGNOSIS — S199XXA Unspecified injury of neck, initial encounter: Secondary | ICD-10-CM | POA: Diagnosis not present

## 2015-09-17 DIAGNOSIS — K219 Gastro-esophageal reflux disease without esophagitis: Secondary | ICD-10-CM | POA: Insufficient documentation

## 2015-09-17 DIAGNOSIS — Z86711 Personal history of pulmonary embolism: Secondary | ICD-10-CM | POA: Insufficient documentation

## 2015-09-17 DIAGNOSIS — F329 Major depressive disorder, single episode, unspecified: Secondary | ICD-10-CM | POA: Diagnosis not present

## 2015-09-17 DIAGNOSIS — S3991XA Unspecified injury of abdomen, initial encounter: Secondary | ICD-10-CM | POA: Diagnosis not present

## 2015-09-17 DIAGNOSIS — S2220XA Unspecified fracture of sternum, initial encounter for closed fracture: Secondary | ICD-10-CM | POA: Diagnosis not present

## 2015-09-17 DIAGNOSIS — S2223XA Sternal manubrial dissociation, initial encounter for closed fracture: Secondary | ICD-10-CM | POA: Diagnosis not present

## 2015-09-17 DIAGNOSIS — Z23 Encounter for immunization: Secondary | ICD-10-CM | POA: Insufficient documentation

## 2015-09-17 DIAGNOSIS — Z792 Long term (current) use of antibiotics: Secondary | ICD-10-CM | POA: Insufficient documentation

## 2015-09-17 DIAGNOSIS — Z79899 Other long term (current) drug therapy: Secondary | ICD-10-CM | POA: Diagnosis not present

## 2015-09-17 DIAGNOSIS — N200 Calculus of kidney: Secondary | ICD-10-CM | POA: Diagnosis not present

## 2015-09-17 DIAGNOSIS — S2221XA Fracture of manubrium, initial encounter for closed fracture: Principal | ICD-10-CM | POA: Insufficient documentation

## 2015-09-17 DIAGNOSIS — Z87891 Personal history of nicotine dependence: Secondary | ICD-10-CM | POA: Diagnosis not present

## 2015-09-17 DIAGNOSIS — S59912A Unspecified injury of left forearm, initial encounter: Secondary | ICD-10-CM | POA: Diagnosis not present

## 2015-09-17 DIAGNOSIS — Z7901 Long term (current) use of anticoagulants: Secondary | ICD-10-CM | POA: Diagnosis not present

## 2015-09-17 DIAGNOSIS — R079 Chest pain, unspecified: Secondary | ICD-10-CM | POA: Diagnosis present

## 2015-09-17 DIAGNOSIS — S4992XA Unspecified injury of left shoulder and upper arm, initial encounter: Secondary | ICD-10-CM | POA: Diagnosis not present

## 2015-09-17 DIAGNOSIS — S299XXA Unspecified injury of thorax, initial encounter: Secondary | ICD-10-CM | POA: Diagnosis not present

## 2015-09-17 LAB — COMPREHENSIVE METABOLIC PANEL
ALT: 31 U/L (ref 17–63)
AST: 33 U/L (ref 15–41)
Albumin: 3.8 g/dL (ref 3.5–5.0)
Alkaline Phosphatase: 103 U/L (ref 38–126)
Anion gap: 5 (ref 5–15)
BUN: 10 mg/dL (ref 6–20)
CO2: 24 mmol/L (ref 22–32)
Calcium: 11 mg/dL — ABNORMAL HIGH (ref 8.9–10.3)
Chloride: 110 mmol/L (ref 101–111)
Creatinine, Ser: 1.19 mg/dL (ref 0.61–1.24)
GFR calc Af Amer: >60 mL/min (ref >60)
GFR calc non Af Amer: >60 mL/min (ref >60)
Glucose, Bld: 99 mg/dL (ref 65–99)
Potassium: 4 mmol/L (ref 3.5–5.1)
Sodium: 139 mmol/L (ref 135–145)
Total Bilirubin: 1 mg/dL (ref 0.3–1.2)
Total Protein: 6.6 g/dL (ref 6.5–8.1)

## 2015-09-17 LAB — CBC WITH DIFFERENTIAL/PLATELET
Basophils Absolute: 0.1 10*3/uL (ref 0.0–0.1)
Basophils Relative: 0 %
Eosinophils Absolute: 0.1 10*3/uL (ref 0.0–0.7)
Eosinophils Relative: 0 %
HCT: 50.6 % (ref 39.0–52.0)
Hemoglobin: 16.9 g/dL (ref 13.0–17.0)
Lymphocytes Relative: 9 %
Lymphs Abs: 1.4 10*3/uL (ref 0.7–4.0)
MCH: 29.3 pg (ref 26.0–34.0)
MCHC: 33.4 g/dL (ref 30.0–36.0)
MCV: 87.8 fL (ref 78.0–100.0)
Monocytes Absolute: 1.6 10*3/uL — ABNORMAL HIGH (ref 0.1–1.0)
Monocytes Relative: 10 %
Neutro Abs: 13.4 10*3/uL — ABNORMAL HIGH (ref 1.7–7.7)
Neutrophils Relative %: 81 %
Platelets: 178 10*3/uL (ref 150–400)
RBC: 5.76 MIL/uL (ref 4.22–5.81)
RDW: 14.2 % (ref 11.5–15.5)
WBC: 16.5 10*3/uL — ABNORMAL HIGH (ref 4.0–10.5)

## 2015-09-17 LAB — PROTIME-INR
INR: 2.45
Prothrombin Time: 27 seconds — ABNORMAL HIGH (ref 11.4–15.2)

## 2015-09-17 MED ORDER — TETANUS-DIPHTH-ACELL PERTUSSIS 5-2.5-18.5 LF-MCG/0.5 IM SUSP
0.5000 mL | Freq: Once | INTRAMUSCULAR | Status: AC
  Administered 2015-09-17: 0.5 mL via INTRAMUSCULAR
  Filled 2015-09-17: qty 0.5

## 2015-09-17 MED ORDER — FENTANYL CITRATE (PF) 100 MCG/2ML IJ SOLN
50.0000 ug | Freq: Once | INTRAMUSCULAR | Status: AC
  Administered 2015-09-17: 50 ug via INTRAVENOUS
  Filled 2015-09-17: qty 2

## 2015-09-17 MED ORDER — OXYCODONE-ACETAMINOPHEN 5-325 MG PO TABS
2.0000 | ORAL_TABLET | Freq: Once | ORAL | Status: AC
  Administered 2015-09-17: 2 via ORAL
  Filled 2015-09-17: qty 2

## 2015-09-17 MED ORDER — SODIUM CHLORIDE 0.9 % IV BOLUS (SEPSIS)
1000.0000 mL | Freq: Once | INTRAVENOUS | Status: AC
  Administered 2015-09-17: 1000 mL via INTRAVENOUS

## 2015-09-17 MED ORDER — IOPAMIDOL (ISOVUE-300) INJECTION 61%
INTRAVENOUS | Status: AC
Start: 2015-09-17 — End: 2015-09-17
  Administered 2015-09-17: 100 mL
  Filled 2015-09-17: qty 100

## 2015-09-17 NOTE — ED Notes (Signed)
PT's wife taken water, graham crackers, and peanut butter. PT's son has no requests.

## 2015-09-17 NOTE — ED Notes (Signed)
PT placed on 2L O2 via [place]. O2 sats drop to 89-90% on RA after fentanyl administration. O2 sats up to 94% after O2 applied.
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
## 2015-09-17 NOTE — ED Notes (Signed)
Care handoff to [facility]
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
## 2015-09-17 NOTE — ED Notes (Signed)
PT returns from radiology. PT reports that fentanyl did not seem to have an effect on his pain. PT rates pain 7/10 at this time and requests additional pain medicine. Dr. Dayna Barker made aware

## 2015-09-17 NOTE — ED Notes (Signed)
PT in CT at this time. PT's visitors have no requests.

## 2015-09-18 ENCOUNTER — Encounter (HOSPITAL_COMMUNITY): Payer: Self-pay | Admitting: *Deleted

## 2015-09-18 DIAGNOSIS — S2221XA Fracture of manubrium, initial encounter for closed fracture: Secondary | ICD-10-CM | POA: Diagnosis not present

## 2015-09-18 DIAGNOSIS — S2220XA Unspecified fracture of sternum, initial encounter for closed fracture: Secondary | ICD-10-CM | POA: Diagnosis not present

## 2015-09-18 DIAGNOSIS — S0003XA Contusion of scalp, initial encounter: Secondary | ICD-10-CM | POA: Diagnosis not present

## 2015-09-18 DIAGNOSIS — Z86711 Personal history of pulmonary embolism: Secondary | ICD-10-CM | POA: Diagnosis not present

## 2015-09-18 DIAGNOSIS — Z23 Encounter for immunization: Secondary | ICD-10-CM | POA: Diagnosis not present

## 2015-09-18 DIAGNOSIS — Z86718 Personal history of other venous thrombosis and embolism: Secondary | ICD-10-CM | POA: Diagnosis not present

## 2015-09-18 DIAGNOSIS — K219 Gastro-esophageal reflux disease without esophagitis: Secondary | ICD-10-CM | POA: Diagnosis not present

## 2015-09-18 DIAGNOSIS — Z7901 Long term (current) use of anticoagulants: Secondary | ICD-10-CM

## 2015-09-18 DIAGNOSIS — F329 Major depressive disorder, single episode, unspecified: Secondary | ICD-10-CM | POA: Diagnosis not present

## 2015-09-18 DIAGNOSIS — Z792 Long term (current) use of antibiotics: Secondary | ICD-10-CM | POA: Diagnosis not present

## 2015-09-18 HISTORY — DX: Unspecified fracture of sternum, initial encounter for closed fracture: S22.20XA

## 2015-09-18 LAB — CBC
HCT: 45.8 % (ref 39.0–52.0)
Hemoglobin: 15.3 g/dL (ref 13.0–17.0)
MCH: 29.1 pg (ref 26.0–34.0)
MCHC: 33.4 g/dL (ref 30.0–36.0)
MCV: 87.1 fL (ref 78.0–100.0)
Platelets: 192 10*3/uL (ref 150–400)
RBC: 5.26 MIL/uL (ref 4.22–5.81)
RDW: 14.5 % (ref 11.5–15.5)
WBC: 9.8 10*3/uL (ref 4.0–10.5)

## 2015-09-18 LAB — PROTIME-INR
INR: 2.57
Prothrombin Time: 28.1 seconds — ABNORMAL HIGH (ref 11.4–15.2)

## 2015-09-18 MED ORDER — SODIUM CHLORIDE 0.9% FLUSH
3.0000 mL | Freq: Two times a day (BID) | INTRAVENOUS | Status: DC
  Administered 2015-09-18: 3 mL via INTRAVENOUS

## 2015-09-18 MED ORDER — SIMVASTATIN 20 MG PO TABS: 20.0000 mg | ORAL_TABLET | Freq: Every day | ORAL | Status: DC

## 2015-09-18 MED ORDER — SODIUM CHLORIDE 0.9% FLUSH: 3.0000 mL | INTRAVENOUS | Status: DC | PRN

## 2015-09-18 MED ORDER — ONDANSETRON HCL 4 MG PO TABS: 4.0000 mg | ORAL_TABLET | Freq: Four times a day (QID) | ORAL | Status: DC | PRN

## 2015-09-18 MED ORDER — POLYETHYLENE GLYCOL 3350 17 G PO PACK
17.0000 g | PACK | Freq: Every day | ORAL | Status: DC
  Filled 2015-09-18: qty 1

## 2015-09-18 MED ORDER — HYDROMORPHONE HCL 1 MG/ML IJ SOLN
0.2000 mg | INTRAMUSCULAR | Status: DC | PRN
  Administered 2015-09-18: 0.2 mg via INTRAVENOUS
  Filled 2015-09-18: qty 1

## 2015-09-18 MED ORDER — OXYCODONE-ACETAMINOPHEN 5-325 MG PO TABS: 1.0000 | ORAL_TABLET | ORAL | 0 refills | Status: DC | PRN

## 2015-09-18 MED ORDER — DOCUSATE SODIUM 100 MG PO CAPS
100.0000 mg | ORAL_CAPSULE | Freq: Two times a day (BID) | ORAL | Status: DC
  Administered 2015-09-18: 100 mg via ORAL
  Filled 2015-09-18: qty 1

## 2015-09-18 MED ORDER — OXYCODONE HCL 5 MG PO TABS
5.0000 mg | ORAL_TABLET | ORAL | Status: DC | PRN
  Administered 2015-09-18: 5 mg via ORAL
  Filled 2015-09-18: qty 1

## 2015-09-18 MED ORDER — OXYCODONE HCL 5 MG PO TABS
5.0000 mg | ORAL_TABLET | ORAL | Status: DC | PRN
  Administered 2015-09-18: 15 mg via ORAL
  Filled 2015-09-18: qty 3

## 2015-09-18 MED ORDER — HYDROMORPHONE HCL 1 MG/ML IJ SOLN: 0.5000 mg | INTRAMUSCULAR | Status: DC | PRN

## 2015-09-18 MED ORDER — SERTRALINE HCL 50 MG PO TABS
50.0000 mg | ORAL_TABLET | Freq: Every day | ORAL | Status: DC
  Administered 2015-09-18: 50 mg via ORAL
  Filled 2015-09-18: qty 1

## 2015-09-18 MED ORDER — PANTOPRAZOLE SODIUM 40 MG PO TBEC
40.0000 mg | DELAYED_RELEASE_TABLET | Freq: Every day | ORAL | Status: DC
  Administered 2015-09-18: 40 mg via ORAL
  Filled 2015-09-18: qty 1

## 2015-09-18 MED ORDER — ACETAMINOPHEN 325 MG PO TABS: 650.0000 mg | ORAL_TABLET | ORAL | Status: DC | PRN

## 2015-09-18 MED ORDER — TRAMADOL HCL 50 MG PO TABS
100.0000 mg | ORAL_TABLET | Freq: Four times a day (QID) | ORAL | Status: DC
Start: 2015-09-18 — End: 2015-09-18
  Administered 2015-09-18: 100 mg via ORAL
  Filled 2015-09-18: qty 2

## 2015-09-18 MED ORDER — MIRTAZAPINE 15 MG PO TABS
30.0000 mg | ORAL_TABLET | Freq: Every day | ORAL | Status: DC
  Administered 2015-09-18: 30 mg via ORAL
  Filled 2015-09-18: qty 2

## 2015-09-18 MED ORDER — ONDANSETRON HCL 4 MG/2ML IJ SOLN: 4.0000 mg | Freq: Four times a day (QID) | INTRAMUSCULAR | Status: DC | PRN

## 2015-09-18 MED ORDER — SODIUM CHLORIDE 0.9 % IV SOLN: 250.0000 mL | INTRAVENOUS | Status: DC | PRN

## 2015-09-18 MED ORDER — NAPROXEN 250 MG PO TABS: 500.0000 mg | ORAL_TABLET | Freq: Two times a day (BID) | ORAL | Status: DC

## 2015-09-18 MED ORDER — TRAMADOL HCL 50 MG PO TABS: 100.0000 mg | ORAL_TABLET | Freq: Four times a day (QID) | ORAL | 0 refills | Status: DC

## 2015-09-18 MED ORDER — NAPROXEN 500 MG PO TABS: 500.0000 mg | ORAL_TABLET | Freq: Two times a day (BID) | ORAL | 1 refills | Status: DC

## 2015-09-18 MED FILL — OXYCODONE/APAP 5-325: 5-325 | 3 days supply | Qty: 30 | Fill #0

## 2015-09-18 MED FILL — NAPROXEN 500 MG TABLET: 500 | 30 days supply | Qty: 60 | Fill #0

## 2015-09-18 MED FILL — traMADol HCL 50 MG TABS: 50 | 13 days supply | Qty: 100 | Fill #0
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
## 2015-09-18 NOTE — Discharge Instructions (Signed)
Increase activity as pain allows.  No driving while taking oxycodone.  Resume Coumadin at regular home dose on Friday.
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
## 2015-09-18 NOTE — Progress Notes (Signed)
Tyler Mason to be D/C'd Home per MD order.  Discussed with the patient and all questions fully answered.  VSS, Skin clean, dry and intact without evidence of skin break down, no evidence of skin tears noted. IV catheter discontinued intact. Site without signs and symptoms of complications. Dressing and pressure applied.  An After Visit Summary was printed and given to the patient. Patient received prescription.  D/c education completed with patient/family including follow up instructions, medication list, d/c activities limitations if indicated, with other d/c instructions as indicated by MD - patient able to verbalize understanding, all questions fully answered.   Patient instructed to return to ED, call 911, or call MD for any changes in condition.   Patient escorted via [place], and D/C home via private auto.  Jerry Caras 09/18/2015 4:36 PM
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
## 2015-09-25 DIAGNOSIS — H524 Presbyopia: Secondary | ICD-10-CM | POA: Diagnosis not present

## 2015-09-25 DIAGNOSIS — H52223 Regular astigmatism, bilateral: Secondary | ICD-10-CM | POA: Diagnosis not present

## 2015-09-25 DIAGNOSIS — H5213 Myopia, bilateral: Secondary | ICD-10-CM | POA: Diagnosis not present

## 2015-09-26 DIAGNOSIS — I2699 Other pulmonary embolism without acute cor pulmonale: Secondary | ICD-10-CM | POA: Diagnosis not present

## 2015-09-26 DIAGNOSIS — E782 Mixed hyperlipidemia: Secondary | ICD-10-CM | POA: Diagnosis not present

## 2015-09-26 DIAGNOSIS — K219 Gastro-esophageal reflux disease without esophagitis: Secondary | ICD-10-CM | POA: Diagnosis not present

## 2015-09-26 DIAGNOSIS — N182 Chronic kidney disease, stage 2 (mild): Secondary | ICD-10-CM | POA: Diagnosis not present

## 2015-09-26 DIAGNOSIS — F324 Major depressive disorder, single episode, in partial remission: Secondary | ICD-10-CM | POA: Diagnosis not present

## 2015-09-26 DIAGNOSIS — Z7901 Long term (current) use of anticoagulants: Secondary | ICD-10-CM | POA: Diagnosis not present

## 2015-09-26 DIAGNOSIS — I82401 Acute embolism and thrombosis of unspecified deep veins of right lower extremity: Secondary | ICD-10-CM | POA: Diagnosis not present

## 2015-09-26 DIAGNOSIS — S2220XA Unspecified fracture of sternum, initial encounter for closed fracture: Secondary | ICD-10-CM | POA: Diagnosis not present

## 2015-09-26 DIAGNOSIS — R590 Localized enlarged lymph nodes: Secondary | ICD-10-CM | POA: Diagnosis not present

## 2015-09-27 ENCOUNTER — Other Ambulatory Visit (HOSPITAL_COMMUNITY): Payer: Self-pay | Admitting: Internal Medicine

## 2015-09-27 DIAGNOSIS — R590 Localized enlarged lymph nodes: Principal | ICD-10-CM

## 2015-09-27 DIAGNOSIS — D861 Sarcoidosis of lymph nodes: Secondary | ICD-10-CM

## 2015-09-28 MED FILL — CHOLESTYRAMINE PACKET: 4 | 30 days supply | Qty: 60 | Fill #1

## 2015-10-04 ENCOUNTER — Encounter (HOSPITAL_COMMUNITY)
Admission: RE | Admit: 2015-10-04 | Discharge: 2015-10-04 | Disposition: A | Discharge status: Home / Self Care | Payer: 59 | Source: Ambulatory Visit | Attending: Internal Medicine | Admitting: Internal Medicine

## 2015-10-04 DIAGNOSIS — D861 Sarcoidosis of lymph nodes: Secondary | ICD-10-CM | POA: Insufficient documentation

## 2015-10-04 DIAGNOSIS — D869 Sarcoidosis, unspecified: Secondary | ICD-10-CM | POA: Diagnosis not present

## 2015-10-04 DIAGNOSIS — R590 Localized enlarged lymph nodes: Secondary | ICD-10-CM | POA: Insufficient documentation

## 2015-10-04 LAB — GLUCOSE, CAPILLARY: Glucose-Capillary: 109 mg/dL — ABNORMAL HIGH (ref 65–99)

## 2015-10-04 MED ORDER — FLUDEOXYGLUCOSE F - 18 (FDG) INJECTION
9.7300 | Freq: Once | INTRAVENOUS | Status: AC | PRN
  Administered 2015-10-04: 9.73 via INTRAVENOUS

## 2015-10-11 MED FILL — SIMVASTATIN 20 MG TABLET: 20 | 90 days supply | Qty: 90 | Fill #0

## 2015-10-15 DIAGNOSIS — F0632 Mood disorder due to known physiological condition with major depressive-like episode: Secondary | ICD-10-CM | POA: Diagnosis not present

## 2015-10-15 DIAGNOSIS — F330 Major depressive disorder, recurrent, mild: Secondary | ICD-10-CM | POA: Diagnosis not present

## 2015-10-15 MED FILL — SERTRALINE HCL 100 MG TAB: 100 | 30 days supply | Qty: 30 | Fill #0

## 2015-10-17 DIAGNOSIS — E782 Mixed hyperlipidemia: Secondary | ICD-10-CM | POA: Diagnosis not present

## 2015-10-17 DIAGNOSIS — E538 Deficiency of other specified B group vitamins: Secondary | ICD-10-CM | POA: Diagnosis not present

## 2015-10-17 DIAGNOSIS — K219 Gastro-esophageal reflux disease without esophagitis: Secondary | ICD-10-CM | POA: Diagnosis not present

## 2015-10-17 DIAGNOSIS — F324 Major depressive disorder, single episode, in partial remission: Secondary | ICD-10-CM | POA: Diagnosis not present

## 2015-10-17 DIAGNOSIS — I2699 Other pulmonary embolism without acute cor pulmonale: Secondary | ICD-10-CM | POA: Diagnosis not present

## 2015-10-17 DIAGNOSIS — N200 Calculus of kidney: Secondary | ICD-10-CM | POA: Diagnosis not present

## 2015-10-17 DIAGNOSIS — N182 Chronic kidney disease, stage 2 (mild): Secondary | ICD-10-CM | POA: Diagnosis not present

## 2015-10-17 DIAGNOSIS — R42 Dizziness and giddiness: Secondary | ICD-10-CM | POA: Diagnosis not present

## 2015-10-17 DIAGNOSIS — Z1159 Encounter for screening for other viral diseases: Secondary | ICD-10-CM | POA: Diagnosis not present

## 2015-10-17 DIAGNOSIS — Z148 Genetic carrier of other disease: Secondary | ICD-10-CM | POA: Diagnosis not present

## 2015-10-17 MED FILL — CLINDAMYCIN HCL 150 MG CAP: 150 | 8 days supply | Qty: 30 | Fill #0

## 2015-10-17 MED FILL — HYDROCODON-APAP 7.5-325: 7.5-325 | 3 days supply | Qty: 20 | Fill #0

## 2015-10-19 DIAGNOSIS — Z7901 Long term (current) use of anticoagulants: Secondary | ICD-10-CM | POA: Diagnosis not present

## 2015-10-23 DIAGNOSIS — Z7901 Long term (current) use of anticoagulants: Secondary | ICD-10-CM | POA: Diagnosis not present

## 2015-10-30 DIAGNOSIS — Z7901 Long term (current) use of anticoagulants: Secondary | ICD-10-CM | POA: Diagnosis not present

## 2015-11-01 MED FILL — PANTOPRAZOLE SOD DR 40 MG T: 40 | 90 days supply | Qty: 90 | Fill #3

## 2015-11-01 MED FILL — WARFARIN SODIUM 5 MG TABLET: 5 | 90 days supply | Qty: 135 | Fill #1

## 2015-11-06 DIAGNOSIS — I2699 Other pulmonary embolism without acute cor pulmonale: Secondary | ICD-10-CM | POA: Diagnosis not present

## 2015-11-06 DIAGNOSIS — Z7901 Long term (current) use of anticoagulants: Secondary | ICD-10-CM | POA: Diagnosis not present

## 2015-11-13 MED FILL — SERTRALINE HCL 100 MG TAB: 100 | 30 days supply | Qty: 30 | Fill #1

## 2015-11-20 DIAGNOSIS — F330 Major depressive disorder, recurrent, mild: Secondary | ICD-10-CM | POA: Diagnosis not present

## 2015-11-20 DIAGNOSIS — Z7901 Long term (current) use of anticoagulants: Secondary | ICD-10-CM | POA: Diagnosis not present

## 2015-11-20 DIAGNOSIS — M5117 Intervertebral disc disorders with radiculopathy, lumbosacral region: Secondary | ICD-10-CM | POA: Diagnosis not present

## 2015-11-20 DIAGNOSIS — I2699 Other pulmonary embolism without acute cor pulmonale: Secondary | ICD-10-CM | POA: Diagnosis not present

## 2015-11-20 DIAGNOSIS — F0632 Mood disorder due to known physiological condition with major depressive-like episode: Secondary | ICD-10-CM | POA: Diagnosis not present

## 2015-11-23 DIAGNOSIS — F330 Major depressive disorder, recurrent, mild: Secondary | ICD-10-CM | POA: Diagnosis not present

## 2015-12-03 MED FILL — MIRTAZAPINE 30 MG TABLET: 30 | 90 days supply | Qty: 90 | Fill #2

## 2015-12-03 MED FILL — SERTRALINE HCL 100 MG TAB: 100 | 30 days supply | Qty: 30 | Fill #2

## 2015-12-06 MED FILL — CHOLESTYRAMINE PACKET: 4 | 30 days supply | Qty: 60 | Fill #2

## 2015-12-10 DIAGNOSIS — F0632 Mood disorder due to known physiological condition with major depressive-like episode: Secondary | ICD-10-CM | POA: Diagnosis not present

## 2015-12-13 MED FILL — SERTRALINE HCL 100 MG TAB: 100 | 30 days supply | Qty: 60 | Fill #0

## 2015-12-24 DIAGNOSIS — Z7901 Long term (current) use of anticoagulants: Secondary | ICD-10-CM | POA: Diagnosis not present

## 2016-01-08 DIAGNOSIS — F330 Major depressive disorder, recurrent, mild: Secondary | ICD-10-CM | POA: Diagnosis not present

## 2016-01-10 DIAGNOSIS — F0632 Mood disorder due to known physiological condition with major depressive-like episode: Secondary | ICD-10-CM | POA: Diagnosis not present

## 2016-01-10 DIAGNOSIS — F330 Major depressive disorder, recurrent, mild: Secondary | ICD-10-CM | POA: Diagnosis not present

## 2016-01-17 MED FILL — SERTRALINE HCL 100 MG TAB: 100 | 90 days supply | Qty: 180 | Fill #0

## 2016-01-18 MED FILL — SIMVASTATIN 20 MG TABLET: 20 | 90 days supply | Qty: 90 | Fill #1

## 2016-01-22 MED FILL — HYDROCODON-APAP 7.5-325: 7.5-325 | 3 days supply | Qty: 20 | Fill #0

## 2016-01-22 MED FILL — CLINDAMYCIN HCL 150 MG CAPS: 150 | 7 days supply | Qty: 30 | Fill #0

## 2016-01-28 DIAGNOSIS — Z7901 Long term (current) use of anticoagulants: Secondary | ICD-10-CM | POA: Diagnosis not present

## 2016-01-30 MED FILL — PANTOPRAZOLE SOD DR 40 MG T: 40 | 90 days supply | Qty: 90 | Fill #0

## 2016-02-05 MED FILL — PREVIDENT 5000 BOOSTER PLUS: 1.1 | 28 days supply | Qty: 100 | Fill #0

## 2016-02-07 MED FILL — CHOLESTYRAMINE PACKET: 4 | 30 days supply | Qty: 60 | Fill #0

## 2016-02-10 ENCOUNTER — Emergency Department (HOSPITAL_BASED_OUTPATIENT_CLINIC_OR_DEPARTMENT_OTHER)
Admission: EM | Admit: 2016-02-10 | Discharge: 2016-02-11 | Disposition: A | Discharge status: Home / Self Care | Payer: 59 | Attending: Emergency Medicine | Admitting: Emergency Medicine

## 2016-02-10 ENCOUNTER — Emergency Department (HOSPITAL_BASED_OUTPATIENT_CLINIC_OR_DEPARTMENT_OTHER): Payer: 59

## 2016-02-10 ENCOUNTER — Encounter (HOSPITAL_BASED_OUTPATIENT_CLINIC_OR_DEPARTMENT_OTHER): Payer: Self-pay

## 2016-02-10 DIAGNOSIS — Y999 Unspecified external cause status: Secondary | ICD-10-CM | POA: Diagnosis not present

## 2016-02-10 DIAGNOSIS — S20411A Abrasion of right back wall of thorax, initial encounter: Secondary | ICD-10-CM | POA: Insufficient documentation

## 2016-02-10 DIAGNOSIS — Z791 Long term (current) use of non-steroidal anti-inflammatories (NSAID): Secondary | ICD-10-CM | POA: Diagnosis not present

## 2016-02-10 DIAGNOSIS — S0990XA Unspecified injury of head, initial encounter: Secondary | ICD-10-CM | POA: Diagnosis present

## 2016-02-10 DIAGNOSIS — Z7901 Long term (current) use of anticoagulants: Secondary | ICD-10-CM | POA: Insufficient documentation

## 2016-02-10 DIAGNOSIS — Z87891 Personal history of nicotine dependence: Secondary | ICD-10-CM | POA: Diagnosis not present

## 2016-02-10 DIAGNOSIS — Y929 Unspecified place or not applicable: Secondary | ICD-10-CM | POA: Diagnosis not present

## 2016-02-10 DIAGNOSIS — Y9389 Activity, other specified: Secondary | ICD-10-CM | POA: Insufficient documentation

## 2016-02-10 DIAGNOSIS — S01111A Laceration without foreign body of right eyelid and periocular area, initial encounter: Secondary | ICD-10-CM | POA: Diagnosis not present

## 2016-02-10 DIAGNOSIS — W1809XA Striking against other object with subsequent fall, initial encounter: Secondary | ICD-10-CM | POA: Diagnosis not present

## 2016-02-10 DIAGNOSIS — S0011XA Contusion of right eyelid and periocular area, initial encounter: Secondary | ICD-10-CM | POA: Diagnosis not present

## 2016-02-10 DIAGNOSIS — W19XXXA Unspecified fall, initial encounter: Secondary | ICD-10-CM

## 2016-02-10 MED ORDER — LIDOCAINE HCL 2 % IJ SOLN
5.0000 mL | Freq: Once | INTRAMUSCULAR | Status: AC
  Administered 2016-02-11: 100 mg via INTRADERMAL
  Filled 2016-02-10: qty 20

## 2016-02-10 NOTE — ED Notes (Signed)
Pt was working in his basement around 3p this afternoon and fell over some boards. Hit head, but not sure on what. No LOC. PERRL. Approx 2 cm lac over right eye. Bleeding controlled. Abrasion to posterior aspect of right shoulder. Pt is on blood thinners.

## 2016-02-10 NOTE — ED Triage Notes (Signed)
Reports was walking backwards and fell hitting face he thinks on a stool.  Denies LOC patient is on coumadin

## 2016-02-10 NOTE — ED Notes (Signed)
Patient transported to CT 
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
## 2016-02-11 DIAGNOSIS — F330 Major depressive disorder, recurrent, mild: Secondary | ICD-10-CM | POA: Diagnosis not present

## 2016-02-11 DIAGNOSIS — Z7901 Long term (current) use of anticoagulants: Secondary | ICD-10-CM | POA: Diagnosis not present

## 2016-02-11 DIAGNOSIS — S20411A Abrasion of right back wall of thorax, initial encounter: Secondary | ICD-10-CM | POA: Diagnosis not present

## 2016-02-11 DIAGNOSIS — Z791 Long term (current) use of non-steroidal anti-inflammatories (NSAID): Secondary | ICD-10-CM | POA: Diagnosis not present

## 2016-02-11 DIAGNOSIS — S01111A Laceration without foreign body of right eyelid and periocular area, initial encounter: Secondary | ICD-10-CM | POA: Diagnosis not present

## 2016-02-11 DIAGNOSIS — Z87891 Personal history of nicotine dependence: Secondary | ICD-10-CM | POA: Diagnosis not present

## 2016-02-11 DIAGNOSIS — S0990XA Unspecified injury of head, initial encounter: Secondary | ICD-10-CM | POA: Diagnosis not present

## 2016-02-11 LAB — PROTIME-INR
INR: 2.23
Prothrombin Time: 25.1 seconds — ABNORMAL HIGH (ref 11.4–15.2)

## 2016-02-21 MED FILL — WARFARIN SODIUM 5 MG TABLET: 5 | 90 days supply | Qty: 135 | Fill #2

## 2016-02-25 ENCOUNTER — Other Ambulatory Visit: Payer: Self-pay | Admitting: Internal Medicine

## 2016-02-25 ENCOUNTER — Ambulatory Visit
Admission: RE | Admit: 2016-02-25 | Discharge: 2016-02-25 | Disposition: A | Discharge status: Home / Self Care | Payer: 59 | Source: Ambulatory Visit | Attending: Internal Medicine | Admitting: Internal Medicine

## 2016-02-25 DIAGNOSIS — M549 Dorsalgia, unspecified: Secondary | ICD-10-CM | POA: Diagnosis not present

## 2016-02-25 DIAGNOSIS — F324 Major depressive disorder, single episode, in partial remission: Secondary | ICD-10-CM | POA: Diagnosis not present

## 2016-02-25 DIAGNOSIS — Z7901 Long term (current) use of anticoagulants: Secondary | ICD-10-CM | POA: Diagnosis not present

## 2016-02-25 DIAGNOSIS — I2699 Other pulmonary embolism without acute cor pulmonale: Secondary | ICD-10-CM | POA: Diagnosis not present

## 2016-02-25 DIAGNOSIS — M47816 Spondylosis without myelopathy or radiculopathy, lumbar region: Secondary | ICD-10-CM | POA: Diagnosis not present

## 2016-02-25 DIAGNOSIS — K219 Gastro-esophageal reflux disease without esophagitis: Secondary | ICD-10-CM | POA: Diagnosis not present

## 2016-02-25 DIAGNOSIS — N182 Chronic kidney disease, stage 2 (mild): Secondary | ICD-10-CM | POA: Diagnosis not present

## 2016-02-25 MED FILL — CEPHALEXIN 500 MG CAPSULE: 500 | 10 days supply | Qty: 20 | Fill #0

## 2016-02-27 MED FILL — CIPROFLOXACIN HCL 500 MG TA: 500 | 7 days supply | Qty: 14 | Fill #0

## 2016-02-28 DIAGNOSIS — N200 Calculus of kidney: Secondary | ICD-10-CM | POA: Diagnosis not present

## 2016-03-04 MED FILL — MIRTAZAPINE 30 MG TABLET: 30 | 90 days supply | Qty: 90 | Fill #0

## 2016-03-10 DIAGNOSIS — F330 Major depressive disorder, recurrent, mild: Secondary | ICD-10-CM | POA: Diagnosis not present

## 2016-03-24 DIAGNOSIS — Z7901 Long term (current) use of anticoagulants: Secondary | ICD-10-CM | POA: Diagnosis not present

## 2016-03-26 DIAGNOSIS — E210 Primary hyperparathyroidism: Secondary | ICD-10-CM | POA: Diagnosis not present

## 2016-03-28 ENCOUNTER — Other Ambulatory Visit (HOSPITAL_COMMUNITY): Payer: Self-pay | Admitting: Surgery

## 2016-03-28 DIAGNOSIS — E210 Primary hyperparathyroidism: Secondary | ICD-10-CM

## 2016-04-07 MED FILL — CHOLESTYRAMINE PACKET: 4 | 30 days supply | Qty: 60 | Fill #0

## 2016-04-09 DIAGNOSIS — F330 Major depressive disorder, recurrent, mild: Secondary | ICD-10-CM | POA: Diagnosis not present

## 2016-04-10 ENCOUNTER — Encounter (HOSPITAL_COMMUNITY)
Admission: RE | Admit: 2016-04-10 | Discharge: 2016-04-10 | Disposition: A | Discharge status: Home / Self Care | Payer: 59 | Source: Ambulatory Visit | Attending: Surgery | Admitting: Surgery

## 2016-04-10 DIAGNOSIS — E213 Hyperparathyroidism, unspecified: Secondary | ICD-10-CM | POA: Diagnosis not present

## 2016-04-10 DIAGNOSIS — E210 Primary hyperparathyroidism: Secondary | ICD-10-CM | POA: Insufficient documentation

## 2016-04-10 MED ORDER — TECHNETIUM TC 99M SESTAMIBI GENERIC - CARDIOLITE
25.0000 | Freq: Once | INTRAVENOUS | Status: AC | PRN
  Administered 2016-04-10: 25 via INTRAVENOUS

## 2016-04-11 ENCOUNTER — Ambulatory Visit: Payer: Self-pay | Admitting: Surgery

## 2016-04-15 ENCOUNTER — Ambulatory Visit: Payer: Self-pay | Admitting: Surgery

## 2016-04-15 MED FILL — SIMVASTATIN 20 MG TABLET: 20 | 90 days supply | Qty: 90 | Fill #2

## 2016-04-21 DIAGNOSIS — Z7901 Long term (current) use of anticoagulants: Secondary | ICD-10-CM | POA: Diagnosis not present

## 2016-04-21 MED FILL — SERTRALINE HCL 100 MG TAB: 100 | 90 days supply | Qty: 180 | Fill #1

## 2016-04-21 MED FILL — PANTOPRAZOLE SOD DR 40 MG T: 40 | 90 days supply | Qty: 90 | Fill #1

## 2016-04-30 NOTE — Patient Instructions (Addendum)
DEREL MCGLASSON  04/30/2016   Your procedure is scheduled on: 05/09/2016    Report to [facility] Main  Entrance and take [facility] elevators to 3rd floor to [place] at 0900am.       Call this number if you have problems the morning of surgery [phone]    Remember: ONLY 1 PERSON MAY GO WITH YOU TO SHORT STAY TO GET  READY MORNING OF [place].  Do not eat food or drink liquids :After Midnight.     Take these medicines the morning of surgery with A SIP OF WATER: protonix , Zoloft                                 You may not have any metal on your body including hair pins and              piercings  Do not wear jewelry, , lotions, powders or perfumes, deodorant            .              Men may shave face and neck.   Do not bring valuables to the hospital. [place].  Contacts, dentures or bridgework may not be worn into surgery.      Patients discharged the day of surgery will not be allowed to drive home.  Name and phone number of your driver:               Please read over the following fact sheets you were given: _____________________________________________________________________             [facility] - Preparing for Surgery Before surgery, you can play an important role.  Because skin is not sterile, your skin needs to be as free of germs as possible.  You can reduce the number of germs on your skin by washing with CHG (chlorahexidine gluconate) soap before surgery.  CHG is an antiseptic cleaner which kills germs and bonds with the skin to continue killing germs even after washing. Please DO NOT use if you have an allergy to CHG or antibacterial soaps.  If your skin becomes reddened/irritated stop using the CHG and inform your nurse when you arrive at Short Stay. Do not shave (including legs and underarms) for at least 48 hours prior to the first CHG shower.  You may shave your  face/neck. Please follow these instructions carefully:  1.  Shower with CHG Soap the night before surgery and the  morning of Surgery.  2.  If you choose to wash your hair, wash your hair first as usual with your  normal  shampoo.  3.  After you shampoo, rinse your hair and body thoroughly to remove the  shampoo.                           4.  Use CHG as you would any other liquid soap.  You can apply chg directly  to the skin and wash                       Gently with a scrungie or clean washcloth.  5.  Apply the  CHG Soap to your body ONLY FROM THE NECK DOWN.   Do not use on face/ open                           Wound or open sores. Avoid contact with eyes, ears mouth and genitals (private parts).                       Wash face,  Genitals (private parts) with your normal soap.             6.  Wash thoroughly, paying special attention to the area where your surgery  will be performed.  7.  Thoroughly rinse your body with warm water from the neck down.  8.  DO NOT shower/wash with your normal soap after using and rinsing off  the CHG Soap.                9.  Pat yourself dry with a clean towel.            10.  Wear clean pajamas.            11.  Place clean sheets on your bed the night of your first shower and do not  sleep with pets. Day of Surgery : Do not apply any lotions/deodorants the morning of surgery.  Please wear clean clothes to the hospital/surgery center.  FAILURE TO FOLLOW THESE INSTRUCTIONS MAY RESULT IN THE CANCELLATION OF YOUR SURGERY PATIENT SIGNATURE_________________________________  NURSE SIGNATURE__________________________________  ________________________________________________________________________

## 2016-05-04 ENCOUNTER — Encounter (HOSPITAL_COMMUNITY): Payer: Self-pay | Admitting: Surgery

## 2016-05-04 DIAGNOSIS — E210 Primary hyperparathyroidism: Secondary | ICD-10-CM | POA: Diagnosis present

## 2016-05-04 NOTE — H&P (Signed)
General Surgery [facility]  Kelle Darting DOB: 10/05/1950 Married / Language: Cleophus Molt / Race: White Male   History of Present Illness  The patient is a 66 year old male who presents with primary hyperparathyroidism.  Patient is referred by Dr. Kathie Rhodes for evaluation of suspected primary hyperparathyroidism. Patient's primary care physician is Dr. [name]. Patient has been noted to have elevated serum calcium levels ranging from 10.4-12.3. Additional laboratory studies showed a markedly elevated intact PTH level of 286. Patient has had no imaging studies to date. Patient does have a history of nephrolithiasis. Stone analysis showed calcium oxalate and calcium phosphate composition. Patient had sustained a traumatic brain injury and 1994 and underwent tracheostomy at that time. Tracheostomy site is well-healed without complication. There is a family history of hypothyroidism and the patient's mother. There is no other family history of endocrine disease. Patient is also anticoagulated due to a history of pulmonary embolism and deep venous thrombosis approximately 10 years ago. He is on Coumadin. He has been told by his primary physician that he would be on that for life. Apparently the Coumadin has been stopped for 5 days prior to previous surgical procedures. He has not required a Lovenox bridge. We will confirm this with his primary care physician prior to any surgical intervention.   Diagnostic Studies History  Colonoscopy  within last year  Allergies Mammie Lorenzo, LPN; 5/64/3329 5:18 AM) Ampicillin *PENICILLINS*  Rash. Macrodantin *URINARY ANTI-INFECTIVES*  Itching, Swelling, Rash.  Medication History  Mirtazapine (30MG  Tablet, Oral) Active. Pantoprazole Sodium (40MG  Tablet DR, Oral) Active. Sertraline HCl (100MG  Tablet, Oral) Active. Simvastatin (20MG  Tablet, Oral) Active. Warfarin Sodium (5MG  Tablet, Oral)  Active. Cranberry (125MG  Tablet, Oral) Active. Vitamin B12 (1000MCG Tablet ER, Oral) Active.  Social History No alcohol use  No caffeine use  No drug use  Tobacco use  Former smoker.  Family History Arthritis  Sister. Depression  Sister.  Other Problems  Back Pain  Depression  Gastroesophageal Reflux Disease  Hemorrhoids  Kidney Stone  Migraine Headache  Pulmonary Embolism / Blood Clot in Legs     Review of Systems General Present- Fatigue. Not Present- Appetite Loss, Chills, Fever, Night Sweats, Weight Gain and Weight Loss. Skin Not Present- Change in Wart/Mole, Dryness, Hives, Jaundice, New Lesions, Non-Healing Wounds, Rash and Ulcer. HEENT Present- Hearing Loss, Ringing in the Ears and Wears glasses/contact lenses. Not Present- Earache, Hoarseness, Nose Bleed, Oral Ulcers, Seasonal Allergies, Sinus Pain, Sore Throat, Visual Disturbances and Yellow Eyes. Respiratory Present- Snoring. Not Present- Bloody sputum, Chronic Cough, Difficulty Breathing and Wheezing. Breast Not Present- Breast Mass, Breast Pain, Nipple Discharge and Skin Changes. Cardiovascular Not Present- Chest Pain, Difficulty Breathing Lying Down, Leg Cramps, Palpitations, Rapid Heart Rate, Shortness of Breath and Swelling of Extremities. Gastrointestinal Present- Chronic diarrhea and Hemorrhoids. Not Present- Abdominal Pain, Bloating, Bloody Stool, Change in Bowel Habits, Constipation, Difficulty Swallowing, Excessive gas, Gets full quickly at meals, Indigestion, Nausea, Rectal Pain and Vomiting. Male Genitourinary Present- Blood in Urine. Not Present- Change in Urinary Stream, Frequency, Impotence, Nocturia, Painful Urination, Urgency and Urine Leakage. Musculoskeletal Not Present- Back Pain, Joint Pain, Joint Stiffness, Muscle Pain, Muscle Weakness and Swelling of Extremities. Neurological Present- Decreased Memory and Headaches. Not Present- Fainting, Numbness, Seizures, Tingling, Tremor, Trouble  walking and Weakness. Psychiatric Present- Depression. Not Present- Anxiety, Bipolar, Change in Sleep Pattern, Fearful and Frequent crying. Endocrine Present- Cold Intolerance. Not Present- Excessive Hunger, Hair Changes, Heat Intolerance, Hot flashes and New Diabetes. Hematology  Present- Blood Thinners and Easy Bruising. Not Present- Excessive bleeding, Gland problems, HIV and Persistent Infections.  Vitals  Weight: 203.4 lb Height: 70in Body Surface Area: 2.1 m Body Mass Index: 29.18 kg/m  Temp.: 98.66F(Oral)  Pulse: 78 (Regular)  BP: 134/82 (Sitting, Left Arm, Standard)   Physical Exam  The physical exam findings are as follows: Note:General - appears comfortable, no distress; not diaphorectic  HEENT - normocephalic; sclerae clear, gaze dysconjugate; mucous membranes moist, dentition fair; voice normal  Neck - symmetric on extension; no palpable anterior or posterior cervical adenopathy; no palpable masses in the thyroid bed; well-healed anterior cervical scar consistent with previous tracheostomy  Chest - clear bilaterally without rhonchi, rales, or wheeze  Cor - regular rhythm with normal rate; no significant murmur  Ext - non-tender without significant edema or lymphedema  Neuro - grossly intact; mild bilateral tremor    Assessment & Plan  PRIMARY HYPERPARATHYROIDISM (E21.0)  Pt Education - Pamphlet Given - The Parathyroid Surgery Book: discussed with patient and provided information. Follow Up - Call [facility] office after tests / studies doneto discuss further plans  Patient presents with signs and symptoms of hyperparathyroidism on referral from his urologist. Written literature is provided to the patient and his family for review at home.  Patient has signs and symptoms of primary hyperparathyroidism. I would like to proceed with nuclear medicine parathyroid scanning in hopes of identifying the location of a parathyroid adenoma and confirming his  diagnosis. This test is successful in identifying the location of the adenoma approximately 80% of the time. If this test fails to identify the adenoma, then we will obtain either a high-resolution ultrasound or a 4D CT scan of the neck. I will contact the patient and his family with the results.  We discussed minimally invasive parathyroidectomy versus traditional neck exploration. We discussed the hospital stay. We discussed the potential for a second gland adenoma. We will make arrangements for surgery once daily localization procedures are completed. We will also address his anticoagulation issues with his primary care physician.  Patient will undergo sestamibi scan. We will contact him with those results.  ADDENDUM: Nuclear med parathyroid scan is positive for left inferior parathyroid adenoma.  I have recommended minimally invasive parathyroidectomy.  The risks and benefits of the procedure have been discussed at length with the patient.  The patient understands the proposed procedure, potential alternative treatments, and the course of recovery to be expected.  All of the patient's questions have been answered at this time.  The patient wishes to proceed with surgery.  Earnstine Regal, MD, [facility] Office: [phone]

## 2016-05-06 ENCOUNTER — Encounter (HOSPITAL_COMMUNITY): Payer: Self-pay

## 2016-05-06 ENCOUNTER — Ambulatory Visit (HOSPITAL_COMMUNITY)
Admission: RE | Admit: 2016-05-06 | Discharge: 2016-05-06 | Disposition: A | Discharge status: Home / Self Care | Payer: 59 | Source: Ambulatory Visit | Attending: Anesthesiology | Admitting: Anesthesiology

## 2016-05-06 ENCOUNTER — Encounter (HOSPITAL_COMMUNITY)
Admission: RE | Admit: 2016-05-06 | Discharge: 2016-05-06 | Disposition: A | Discharge status: Home / Self Care | Payer: 59 | Source: Ambulatory Visit | Attending: Surgery | Admitting: Surgery

## 2016-05-06 DIAGNOSIS — Z01818 Encounter for other preprocedural examination: Secondary | ICD-10-CM | POA: Diagnosis present

## 2016-05-06 DIAGNOSIS — Z01812 Encounter for preprocedural laboratory examination: Secondary | ICD-10-CM | POA: Diagnosis not present

## 2016-05-06 HISTORY — DX: Anxiety disorder, unspecified: F41.9

## 2016-05-06 LAB — CBC
HCT: 48.9 % (ref 39.0–52.0)
Hemoglobin: 16.7 g/dL (ref 13.0–17.0)
MCH: 29.2 pg (ref 26.0–34.0)
MCHC: 34.2 g/dL (ref 30.0–36.0)
MCV: 85.5 fL (ref 78.0–100.0)
Platelets: 169 10*3/uL (ref 150–400)
RBC: 5.72 MIL/uL (ref 4.22–5.81)
RDW: 15.1 % (ref 11.5–15.5)
WBC: 7.6 10*3/uL (ref 4.0–10.5)

## 2016-05-06 LAB — BASIC METABOLIC PANEL
Anion gap: 6 (ref 5–15)
BUN: 14 mg/dL (ref 6–20)
CO2: 23 mmol/L (ref 22–32)
Calcium: 10.7 mg/dL — ABNORMAL HIGH (ref 8.9–10.3)
Chloride: 110 mmol/L (ref 101–111)
Creatinine, Ser: 1.14 mg/dL (ref 0.61–1.24)
GFR calc Af Amer: >60 mL/min (ref >60)
GFR calc non Af Amer: >60 mL/min (ref >60)
Glucose, Bld: 98 mg/dL (ref 65–99)
Potassium: 4.7 mmol/L (ref 3.5–5.1)
Sodium: 139 mmol/L (ref 135–145)

## 2016-05-06 LAB — PROTIME-INR
INR: 2.12
Prothrombin Time: 24 seconds — ABNORMAL HIGH (ref 11.4–15.2)

## 2016-05-06 LAB — APTT: aPTT: 38 seconds — ABNORMAL HIGH (ref 24–36)

## 2016-05-06 NOTE — Progress Notes (Signed)
PT/INR and PTT done 05/06/2016 faxed via epic to Dr Harlow Asa.

## 2016-05-08 DIAGNOSIS — F0632 Mood disorder due to known physiological condition with major depressive-like episode: Secondary | ICD-10-CM | POA: Diagnosis not present

## 2016-05-08 DIAGNOSIS — F330 Major depressive disorder, recurrent, mild: Secondary | ICD-10-CM | POA: Diagnosis not present

## 2016-05-09 ENCOUNTER — Ambulatory Visit (HOSPITAL_COMMUNITY): Payer: 59 | Admitting: Certified Registered"

## 2016-05-09 ENCOUNTER — Encounter (HOSPITAL_COMMUNITY)
Admission: RE | Disposition: A | Discharge status: Home / Self Care | Payer: Self-pay | Source: Ambulatory Visit | Attending: Surgery

## 2016-05-09 ENCOUNTER — Ambulatory Visit (HOSPITAL_COMMUNITY)
Admission: RE | Admit: 2016-05-09 | Discharge: 2016-05-09 | Disposition: A | Discharge status: Home / Self Care | Payer: 59 | Source: Ambulatory Visit | Attending: Surgery | Admitting: Surgery

## 2016-05-09 ENCOUNTER — Encounter (HOSPITAL_COMMUNITY): Payer: Self-pay | Admitting: Anesthesiology

## 2016-05-09 DIAGNOSIS — K219 Gastro-esophageal reflux disease without esophagitis: Secondary | ICD-10-CM | POA: Insufficient documentation

## 2016-05-09 DIAGNOSIS — Z7901 Long term (current) use of anticoagulants: Secondary | ICD-10-CM | POA: Insufficient documentation

## 2016-05-09 DIAGNOSIS — Z87891 Personal history of nicotine dependence: Secondary | ICD-10-CM | POA: Diagnosis not present

## 2016-05-09 DIAGNOSIS — Z880 Allergy status to penicillin: Secondary | ICD-10-CM | POA: Diagnosis not present

## 2016-05-09 DIAGNOSIS — Z87442 Personal history of urinary calculi: Secondary | ICD-10-CM | POA: Insufficient documentation

## 2016-05-09 DIAGNOSIS — I82509 Chronic embolism and thrombosis of unspecified deep veins of unspecified lower extremity: Secondary | ICD-10-CM | POA: Insufficient documentation

## 2016-05-09 DIAGNOSIS — E210 Primary hyperparathyroidism: Secondary | ICD-10-CM | POA: Diagnosis present

## 2016-05-09 DIAGNOSIS — I2782 Chronic pulmonary embolism: Secondary | ICD-10-CM | POA: Diagnosis not present

## 2016-05-09 DIAGNOSIS — Z8349 Family history of other endocrine, nutritional and metabolic diseases: Secondary | ICD-10-CM | POA: Diagnosis not present

## 2016-05-09 DIAGNOSIS — G4730 Sleep apnea, unspecified: Secondary | ICD-10-CM | POA: Diagnosis not present

## 2016-05-09 DIAGNOSIS — F329 Major depressive disorder, single episode, unspecified: Secondary | ICD-10-CM | POA: Diagnosis not present

## 2016-05-09 DIAGNOSIS — Z87820 Personal history of traumatic brain injury: Secondary | ICD-10-CM | POA: Insufficient documentation

## 2016-05-09 DIAGNOSIS — D351 Benign neoplasm of parathyroid gland: Secondary | ICD-10-CM | POA: Diagnosis not present

## 2016-05-09 DIAGNOSIS — F418 Other specified anxiety disorders: Secondary | ICD-10-CM | POA: Diagnosis not present

## 2016-05-09 HISTORY — PX: PARATHYROIDECTOMY: SHX19

## 2016-05-09 LAB — PROTIME-INR
INR: 1.11
Prothrombin Time: 14.3 seconds (ref 11.4–15.2)

## 2016-05-09 SURGERY — PARATHYROIDECTOMY
Anesthesia: General | Laterality: Left

## 2016-05-09 MED ORDER — BUPIVACAINE HCL 0.25 % IJ SOLN
INTRAMUSCULAR | Status: DC | PRN
  Administered 2016-05-09: 10 mL

## 2016-05-09 MED ORDER — ONDANSETRON HCL 4 MG/2ML IJ SOLN
INTRAMUSCULAR | Status: AC
  Filled 2016-05-09: qty 2

## 2016-05-09 MED ORDER — HYDROCODONE-ACETAMINOPHEN 5-325 MG PO TABS: 1.0000 | ORAL_TABLET | ORAL | 0 refills | Status: DC | PRN

## 2016-05-09 MED ORDER — HYDROMORPHONE HCL 1 MG/ML IJ SOLN
0.2500 mg | INTRAMUSCULAR | Status: DC | PRN
  Administered 2016-05-09: 0.5 mg via INTRAVENOUS

## 2016-05-09 MED ORDER — ARTIFICIAL TEARS OPHTHALMIC OINT
TOPICAL_OINTMENT | OPHTHALMIC | Status: AC
  Filled 2016-05-09: qty 3.5

## 2016-05-09 MED ORDER — DEXAMETHASONE SODIUM PHOSPHATE 10 MG/ML IJ SOLN
INTRAMUSCULAR | Status: AC
  Filled 2016-05-09: qty 1

## 2016-05-09 MED ORDER — 0.9 % SODIUM CHLORIDE (POUR BTL) OPTIME
TOPICAL | Status: DC | PRN
  Administered 2016-05-09: 1000 mL

## 2016-05-09 MED ORDER — OXYCODONE HCL 5 MG PO TABS
5.0000 mg | ORAL_TABLET | Freq: Once | ORAL | Status: AC
  Administered 2016-05-09: 5 mg via ORAL
  Filled 2016-05-09: qty 1

## 2016-05-09 MED ORDER — LIDOCAINE 2% (20 MG/ML) 5 ML SYRINGE
INTRAMUSCULAR | Status: AC
  Filled 2016-05-09: qty 5

## 2016-05-09 MED ORDER — BUPIVACAINE HCL (PF) 0.25 % IJ SOLN
INTRAMUSCULAR | Status: AC
  Filled 2016-05-09: qty 30

## 2016-05-09 MED ORDER — MIDAZOLAM HCL 2 MG/2ML IJ SOLN
INTRAMUSCULAR | Status: AC
Start: 2016-05-09 — End: 2016-05-09
  Filled 2016-05-09: qty 2

## 2016-05-09 MED ORDER — PROPOFOL 10 MG/ML IV BOLUS
INTRAVENOUS | Status: AC
  Filled 2016-05-09: qty 20

## 2016-05-09 MED ORDER — OXYCODONE HCL 5 MG PO TABS: 5.0000 mg | ORAL_TABLET | Freq: Once | ORAL | Status: DC

## 2016-05-09 MED ORDER — CIPROFLOXACIN IN D5W 400 MG/200ML IV SOLN
400.0000 mg | INTRAVENOUS | Status: AC
  Administered 2016-05-09: 400 mg via INTRAVENOUS
  Filled 2016-05-09: qty 200

## 2016-05-09 MED ORDER — SCOPOLAMINE 1 MG/3DAYS TD PT72
MEDICATED_PATCH | TRANSDERMAL | Status: AC
  Filled 2016-05-09: qty 1

## 2016-05-09 MED ORDER — CIPROFLOXACIN IN D5W 400 MG/200ML IV SOLN: 400.0000 mg | INTRAVENOUS | Status: DC

## 2016-05-09 MED ORDER — GLYCOPYRROLATE 0.2 MG/ML IV SOSY
PREFILLED_SYRINGE | INTRAVENOUS | Status: AC
  Filled 2016-05-09: qty 5

## 2016-05-09 MED ORDER — LIDOCAINE HCL (CARDIAC) 20 MG/ML IV SOLN
INTRAVENOUS | Status: DC | PRN
  Administered 2016-05-09: 60 mg via INTRAVENOUS

## 2016-05-09 MED ORDER — LACTATED RINGERS IV SOLN
INTRAVENOUS | Status: DC
  Administered 2016-05-09: 09:00:00 via INTRAVENOUS

## 2016-05-09 MED ORDER — EPHEDRINE SULFATE-NACL 50-0.9 MG/10ML-% IV SOSY
PREFILLED_SYRINGE | INTRAVENOUS | Status: DC | PRN
  Administered 2016-05-09 (×2): 15 mg via INTRAVENOUS

## 2016-05-09 MED ORDER — SUCCINYLCHOLINE CHLORIDE 200 MG/10ML IV SOSY
PREFILLED_SYRINGE | INTRAVENOUS | Status: DC | PRN
  Administered 2016-05-09: 100 mg via INTRAVENOUS

## 2016-05-09 MED ORDER — ROCURONIUM BROMIDE 50 MG/5ML IV SOSY
PREFILLED_SYRINGE | INTRAVENOUS | Status: AC
  Filled 2016-05-09: qty 5

## 2016-05-09 MED ORDER — PROPOFOL 10 MG/ML IV BOLUS
INTRAVENOUS | Status: DC | PRN
  Administered 2016-05-09: 150 mg via INTRAVENOUS

## 2016-05-09 MED ORDER — DEXAMETHASONE SODIUM PHOSPHATE 4 MG/ML IJ SOLN
INTRAMUSCULAR | Status: DC | PRN
  Administered 2016-05-09: 8 mg via INTRAVENOUS

## 2016-05-09 MED ORDER — FENTANYL CITRATE (PF) 250 MCG/5ML IJ SOLN
INTRAMUSCULAR | Status: AC
  Filled 2016-05-09: qty 5

## 2016-05-09 MED ORDER — HYDROMORPHONE HCL 1 MG/ML IJ SOLN
INTRAMUSCULAR | Status: AC
  Filled 2016-05-09: qty 1

## 2016-05-09 MED ORDER — SUCCINYLCHOLINE CHLORIDE 200 MG/10ML IV SOSY
PREFILLED_SYRINGE | INTRAVENOUS | Status: AC
  Filled 2016-05-09: qty 10

## 2016-05-09 MED ORDER — SUGAMMADEX SODIUM 200 MG/2ML IV SOLN
INTRAVENOUS | Status: AC
  Filled 2016-05-09: qty 2

## 2016-05-09 MED ORDER — ROCURONIUM BROMIDE 100 MG/10ML IV SOLN
INTRAVENOUS | Status: DC | PRN
  Administered 2016-05-09: 5 mg via INTRAVENOUS
  Administered 2016-05-09: 20 mg via INTRAVENOUS

## 2016-05-09 MED ORDER — MIDAZOLAM HCL 5 MG/5ML IJ SOLN
INTRAMUSCULAR | Status: DC | PRN
  Administered 2016-05-09: 2 mg via INTRAVENOUS

## 2016-05-09 MED ORDER — PHENYLEPHRINE 40 MCG/ML (10ML) SYRINGE FOR IV PUSH (FOR BLOOD PRESSURE SUPPORT)
PREFILLED_SYRINGE | INTRAVENOUS | Status: AC
  Filled 2016-05-09: qty 10

## 2016-05-09 MED ORDER — EPHEDRINE 5 MG/ML INJ
INTRAVENOUS | Status: AC
  Filled 2016-05-09: qty 10

## 2016-05-09 MED ORDER — SCOPOLAMINE 1 MG/3DAYS TD PT72SCOPOLAMINE 1 MG/3DAYS
1.0000 | MEDICATED_PATCH | TRANSDERMAL | Status: DC
Start: 2016-05-09 — End: 2016-05-09
  Administered 2016-05-09: 1.5 mg via TRANSDERMAL

## 2016-05-09 MED ORDER — PHENYLEPHRINE 40 MCG/ML (10ML) SYRINGE FOR IV PUSH (FOR BLOOD PRESSURE SUPPORT)
PREFILLED_SYRINGE | INTRAVENOUS | Status: DC | PRN
  Administered 2016-05-09 (×2): 80 ug via INTRAVENOUS
  Administered 2016-05-09: 160 ug via INTRAVENOUS
  Administered 2016-05-09: 80 ug via INTRAVENOUS

## 2016-05-09 MED ORDER — FENTANYL CITRATE (PF) 100 MCG/2ML IJ SOLN
INTRAMUSCULAR | Status: DC | PRN
  Administered 2016-05-09 (×3): 50 ug via INTRAVENOUS

## 2016-05-09 MED ORDER — SUGAMMADEX SODIUM 200 MG/2ML IV SOLN
INTRAVENOUS | Status: DC | PRN
  Administered 2016-05-09: 200 mg via INTRAVENOUS

## 2016-05-09 MED ORDER — ONDANSETRON HCL 4 MG/2ML IJ SOLN
INTRAMUSCULAR | Status: DC | PRN
  Administered 2016-05-09: 4 mg via INTRAVENOUS

## 2016-05-09 MED ORDER — PROMETHAZINE HCL 25 MG/ML IJ SOLN: 6.2500 mg | INTRAMUSCULAR | Status: DC | PRN

## 2016-05-09 MED FILL — HYDROCODON-APAP 5-325: 5-325 | 2 days supply | Qty: 20 | Fill #0

## 2016-05-09 SURGICAL SUPPLY — 39 items
ADH SKN CLS APL DERMABOND .7 (GAUZE/BANDAGES/DRESSINGS)
ATTRACTOMAT 16X20 MAGNETIC DRP (DRAPES) ×2 IMPLANT
BLADE HEX COATED 2.75 (ELECTRODE) ×2 IMPLANT
BLADE SURG 15 STRL LF DISP TIS (BLADE) ×1 IMPLANT
BLADE SURG 15 STRL SS (BLADE) ×2
CHLORAPREP W/TINT 26ML (MISCELLANEOUS) ×3 IMPLANT
CLIP TI MEDIUM 6 (CLIP) ×4 IMPLANT
CLIP TI WIDE RED SMALL 6 (CLIP) ×4 IMPLANT
DERMABOND ADVANCED (GAUZE/BANDAGES/DRESSINGS)
DERMABOND ADVANCED .7 DNX12 (GAUZE/BANDAGES/DRESSINGS) IMPLANT
DRAPE LAPAROTOMY T 98X78 PEDS (DRAPES) ×2 IMPLANT
ELECT PENCIL ROCKER SW 15FT (MISCELLANEOUS) ×2 IMPLANT
ELECT REM PT RETURN 15FT ADLT (MISCELLANEOUS) ×2 IMPLANT
GAUZE SPONGE 4X4 12PLY STRL (GAUZE/BANDAGES/DRESSINGS) ×1 IMPLANT
GAUZE SPONGE 4X4 16PLY XRAY LF (GAUZE/BANDAGES/DRESSINGS) ×2 IMPLANT
GLOVE SURG ORTHO 8.0 STRL STRW (GLOVE) ×2 IMPLANT
GOWN STRL REUS W/TWL XL LVL3 (GOWN DISPOSABLE) ×6 IMPLANT
HEMOSTAT SURGICEL 2X4 FIBR (HEMOSTASIS) ×1 IMPLANT
ILLUMINATOR WAVEGUIDE N/F (MISCELLANEOUS) IMPLANT
KIT BASIN OR (CUSTOM PROCEDURE TRAY) ×2 IMPLANT
LIGHT WAVEGUIDE WIDE FLAT (MISCELLANEOUS) IMPLANT
NDL HYPO 25X1 1.5 SAFETY (NEEDLE) ×1 IMPLANT
NEEDLE HYPO 25X1 1.5 SAFETY (NEEDLE) ×2 IMPLANT
PACK BASIC VI WITH GOWN DISP (CUSTOM PROCEDURE TRAY) ×2 IMPLANT
STAPLER VISISTAT 35W (STAPLE) ×2 IMPLANT
STRIP CLOSURE SKIN 1/2X4 (GAUZE/BANDAGES/DRESSINGS) IMPLANT
SUT MNCRL AB 4-0 PS2 18 (SUTURE) ×2 IMPLANT
SUT SILK 2 0 (SUTURE)
SUT SILK 2-0 18XBRD TIE 12 (SUTURE) IMPLANT
SUT SILK 3 0 (SUTURE)
SUT SILK 3-0 18XBRD TIE 12 (SUTURE) IMPLANT
SUT VIC AB 3-0 SH 18 (SUTURE) ×2 IMPLANT
SYR BULB IRRIGATION 50ML (SYRINGE) ×2 IMPLANT
SYR CONTROL 10ML LL (SYRINGE) ×2 IMPLANT
TAPE CLOTH SURG 4X10 WHT LF (GAUZE/BANDAGES/DRESSINGS) ×1 IMPLANT
TAPE STRIPS DRAPE STRL (GAUZE/BANDAGES/DRESSINGS) ×1 IMPLANT
TOWEL OR 17X26 10 PK STRL BLUE (TOWEL DISPOSABLE) ×2 IMPLANT
TOWEL OR NON WOVEN STRL DISP B (DISPOSABLE) ×2 IMPLANT
YANKAUER SUCT BULB TIP 10FT TU (MISCELLANEOUS) ×2 IMPLANT
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
## 2016-05-09 NOTE — Anesthesia Procedure Notes (Signed)
Procedure Name: Intubation Date/Time: 05/09/2016 11:15 AM Performed by: Duane Boston Pre-anesthesia Checklist: Patient identified, Emergency Drugs available, Suction available and Patient being monitored Patient Re-evaluated:Patient Re-evaluated prior to inductionOxygen Delivery Method: Circle system utilized Preoxygenation: Pre-oxygenation with 100% oxygen Intubation Type: IV induction Ventilation: Mask ventilation without difficulty Laryngoscope Size: Mac and 4 Grade View: Grade II Tube type: Oral Tube size: 7.5 mm Number of attempts: 1 Airway Equipment and Method: Stylet and Oral airway Placement Confirmation: ETT inserted through vocal cords under direct vision,  positive ETCO2 and breath sounds checked- equal and bilateral Secured at: 24 cm Tube secured with: Tape Dental Injury: Teeth and Oropharynx as per pre-operative assessment
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
## 2016-05-09 NOTE — Interval H&P Note (Signed)
History and Physical Interval Note:  05/09/2016 10:42 AM  Tyler Mason  has presented today for surgery, with the diagnosis of Primary hyperparathyroidism.  The various methods of treatment have been discussed with the patient and family. After consideration of risks, benefits and other options for treatment, the patient has consented to    Procedure(s): LEFT INFERIOR PARATHYROIDECTOMY (Left) as a surgical intervention .    The patient's history has been reviewed, patient examined, no change in status, stable for surgery.  I have reviewed the patient's chart and labs.  Questions were answered to the patient's satisfaction.    Earnstine Regal, MD, [facility] Office: [place]

## 2016-05-09 NOTE — Transfer of Care (Signed)
Last Vitals:  Vitals:   05/09/16 0852  BP: 131/85  Pulse: 89  Resp: 18  Temp: 36.6 C    Last Pain:  Vitals:   05/09/16 0913  TempSrc:   PainSc: 0-No pain      Patients Stated Pain Goal: 3 (05/09/16 0913)  Immediate Anesthesia Transfer of Care Note  Patient: Tyler Mason  Procedure(s) Performed: Procedure(s) (LRB): LEFT INFERIOR PARATHYROIDECTOMY (Left)  Patient Location: PACU  Anesthesia Type: General  Level of Consciousness: awake, alert  and oriented  Airway & Oxygen Therapy: Patient Spontanous Breathing and Patient connected to face mask oxygen  Post-op Assessment: Report given to PACU RN and Post -op Vital signs reviewed and stable  Post vital signs: Reviewed and stable  Complications: No apparent anesthesia complications

## 2016-05-09 NOTE — Discharge Instructions (Signed)

## 2016-05-09 NOTE — Anesthesia Postprocedure Evaluation (Addendum)
Anesthesia Post Note  Patient: [name]  Procedure(s) Performed: Procedure(s) (LRB): LEFT INFERIOR PARATHYROIDECTOMY (Left)  Patient location during evaluation: PACU Anesthesia Type: General Level of consciousness: sedated Pain management: pain level controlled Vital Signs Assessment: post-procedure vital signs reviewed and stable Respiratory status: spontaneous breathing and respiratory function stable Cardiovascular status: stable Anesthetic complications: no       Last Vitals:  Vitals:   05/09/16 1345 05/09/16 1356  BP: 128/72 129/66  Pulse: 67 60  Resp: 13 16  Temp:  36.7 C    Last Pain:  Vitals:   05/09/16 1400  TempSrc:   PainSc: 5                  Arnesia Vincelette DANIEL

## 2016-05-12 ENCOUNTER — Encounter (HOSPITAL_COMMUNITY): Payer: Self-pay | Admitting: Surgery

## 2016-05-19 DIAGNOSIS — Z7901 Long term (current) use of anticoagulants: Secondary | ICD-10-CM | POA: Diagnosis not present

## 2016-05-19 DIAGNOSIS — R300 Dysuria: Secondary | ICD-10-CM | POA: Diagnosis not present

## 2016-05-20 DIAGNOSIS — E210 Primary hyperparathyroidism: Secondary | ICD-10-CM | POA: Diagnosis not present

## 2016-05-20 MED FILL — CIPROFLOXACIN HCL 500 MG TA: 500 | 10 days supply | Qty: 20 | Fill #0

## 2016-06-03 MED FILL — MIRTAZAPINE 30 MG TABLET: 30 | 90 days supply | Qty: 90 | Fill #1

## 2016-06-07 NOTE — Addendum Note (Signed)
Addendum  created 06/07/16 0916 by Duane Boston, MD   Sign clinical note

## 2016-06-09 MED FILL — WARFARIN SODIUM 5 MG TABLET: 5 | 90 days supply | Qty: 135 | Fill #3

## 2016-06-09 MED FILL — CHOLESTYRAMINE PACKET: 4 | 30 days supply | Qty: 60 | Fill #1

## 2016-06-16 DIAGNOSIS — Z7901 Long term (current) use of anticoagulants: Secondary | ICD-10-CM | POA: Diagnosis not present

## 2016-06-24 DIAGNOSIS — I82401 Acute embolism and thrombosis of unspecified deep veins of right lower extremity: Secondary | ICD-10-CM | POA: Diagnosis not present

## 2016-06-24 DIAGNOSIS — K219 Gastro-esophageal reflux disease without esophagitis: Secondary | ICD-10-CM | POA: Diagnosis not present

## 2016-06-24 DIAGNOSIS — E782 Mixed hyperlipidemia: Secondary | ICD-10-CM | POA: Diagnosis not present

## 2016-06-24 DIAGNOSIS — N182 Chronic kidney disease, stage 2 (mild): Secondary | ICD-10-CM | POA: Diagnosis not present

## 2016-06-24 DIAGNOSIS — F330 Major depressive disorder, recurrent, mild: Secondary | ICD-10-CM | POA: Diagnosis not present

## 2016-06-24 DIAGNOSIS — I2699 Other pulmonary embolism without acute cor pulmonale: Secondary | ICD-10-CM | POA: Diagnosis not present

## 2016-06-24 DIAGNOSIS — Z148 Genetic carrier of other disease: Secondary | ICD-10-CM | POA: Diagnosis not present

## 2016-06-24 DIAGNOSIS — F324 Major depressive disorder, single episode, in partial remission: Secondary | ICD-10-CM | POA: Diagnosis not present

## 2016-07-07 MED FILL — SIMVASTATIN 20 MG TABLET: 20 | 90 days supply | Qty: 90 | Fill #3

## 2016-07-14 DIAGNOSIS — Z7901 Long term (current) use of anticoagulants: Secondary | ICD-10-CM | POA: Diagnosis not present

## 2016-07-16 DIAGNOSIS — Z7901 Long term (current) use of anticoagulants: Secondary | ICD-10-CM | POA: Diagnosis not present

## 2016-07-21 MED FILL — SERTRALINE HCL 100 MG TAB: 100 | 90 days supply | Qty: 180 | Fill #2

## 2016-07-21 MED FILL — PANTOPRAZOLE SOD DR 40 MG T: 40 | 90 days supply | Qty: 90 | Fill #2

## 2016-08-07 MED FILL — CHOLESTYRAMINE PACKET: 4 | 30 days supply | Qty: 60 | Fill #2

## 2016-08-08 DIAGNOSIS — Z7901 Long term (current) use of anticoagulants: Secondary | ICD-10-CM | POA: Diagnosis not present

## 2016-08-18 DIAGNOSIS — F0632 Mood disorder due to known physiological condition with major depressive-like episode: Secondary | ICD-10-CM | POA: Diagnosis not present

## 2016-08-18 DIAGNOSIS — F330 Major depressive disorder, recurrent, mild: Secondary | ICD-10-CM | POA: Diagnosis not present

## 2016-08-19 DIAGNOSIS — F330 Major depressive disorder, recurrent, mild: Secondary | ICD-10-CM | POA: Diagnosis not present

## 2016-09-01 MED FILL — MIRTAZAPINE 30 MG TABLET: 30 | 90 days supply | Qty: 90 | Fill #2

## 2016-09-05 DIAGNOSIS — Z7901 Long term (current) use of anticoagulants: Secondary | ICD-10-CM | POA: Diagnosis not present

## 2016-09-25 DIAGNOSIS — H5213 Myopia, bilateral: Secondary | ICD-10-CM | POA: Diagnosis not present

## 2016-09-25 DIAGNOSIS — H52223 Regular astigmatism, bilateral: Secondary | ICD-10-CM | POA: Diagnosis not present

## 2016-09-25 DIAGNOSIS — H524 Presbyopia: Secondary | ICD-10-CM | POA: Diagnosis not present

## 2016-09-29 MED FILL — WARFARIN SODIUM 5 MG TABLET: 5 | 90 days supply | Qty: 135 | Fill #0

## 2016-10-03 DIAGNOSIS — Z7901 Long term (current) use of anticoagulants: Secondary | ICD-10-CM | POA: Diagnosis not present

## 2016-10-10 MED FILL — CHOLESTYRAMINE PACKET: 4 | 30 days supply | Qty: 60 | Fill #0

## 2016-10-13 MED FILL — SIMVASTATIN 20 MG TABLET: 20 | 90 days supply | Qty: 90 | Fill #0

## 2016-10-13 MED FILL — SERTRALINE HCL 100 MG TAB: 100 | 90 days supply | Qty: 180 | Fill #3

## 2016-10-20 MED FILL — PANTOPRAZOLE SOD DR 40 MG T: 40 | 90 days supply | Qty: 90 | Fill #3

## 2016-10-23 DIAGNOSIS — K219 Gastro-esophageal reflux disease without esophagitis: Secondary | ICD-10-CM | POA: Diagnosis not present

## 2016-10-23 DIAGNOSIS — F324 Major depressive disorder, single episode, in partial remission: Secondary | ICD-10-CM | POA: Diagnosis not present

## 2016-10-23 DIAGNOSIS — Z0000 Encounter for general adult medical examination without abnormal findings: Secondary | ICD-10-CM | POA: Diagnosis not present

## 2016-10-23 DIAGNOSIS — E782 Mixed hyperlipidemia: Secondary | ICD-10-CM | POA: Diagnosis not present

## 2016-10-23 DIAGNOSIS — Z125 Encounter for screening for malignant neoplasm of prostate: Secondary | ICD-10-CM | POA: Diagnosis not present

## 2016-10-23 DIAGNOSIS — Z7189 Other specified counseling: Secondary | ICD-10-CM | POA: Diagnosis not present

## 2016-10-23 DIAGNOSIS — I2699 Other pulmonary embolism without acute cor pulmonale: Secondary | ICD-10-CM | POA: Diagnosis not present

## 2016-10-23 DIAGNOSIS — J309 Allergic rhinitis, unspecified: Secondary | ICD-10-CM | POA: Diagnosis not present

## 2016-10-23 DIAGNOSIS — I82401 Acute embolism and thrombosis of unspecified deep veins of right lower extremity: Secondary | ICD-10-CM | POA: Diagnosis not present

## 2016-10-23 DIAGNOSIS — Z23 Encounter for immunization: Secondary | ICD-10-CM | POA: Diagnosis not present

## 2016-10-23 DIAGNOSIS — Z1389 Encounter for screening for other disorder: Secondary | ICD-10-CM | POA: Diagnosis not present

## 2016-10-23 DIAGNOSIS — N182 Chronic kidney disease, stage 2 (mild): Secondary | ICD-10-CM | POA: Diagnosis not present

## 2016-10-23 DIAGNOSIS — E213 Hyperparathyroidism, unspecified: Secondary | ICD-10-CM | POA: Diagnosis not present

## 2016-10-31 DIAGNOSIS — Z7901 Long term (current) use of anticoagulants: Secondary | ICD-10-CM | POA: Diagnosis not present

## 2016-10-31 DIAGNOSIS — Z23 Encounter for immunization: Secondary | ICD-10-CM | POA: Diagnosis not present

## 2016-11-18 DIAGNOSIS — F330 Major depressive disorder, recurrent, mild: Secondary | ICD-10-CM | POA: Diagnosis not present

## 2016-12-01 DIAGNOSIS — Z7901 Long term (current) use of anticoagulants: Secondary | ICD-10-CM | POA: Diagnosis not present

## 2016-12-01 MED FILL — MIRTAZAPINE 30 MG TABLET: 30 | 90 days supply | Qty: 90 | Fill #0

## 2016-12-02 MED FILL — SHINGRIX VIAL KIT: 50 | 1 days supply | Qty: 1 | Fill #0

## 2016-12-08 MED FILL — CHOLESTYRAMINE PACKET: 4 | 30 days supply | Qty: 60 | Fill #1

## 2016-12-29 DIAGNOSIS — Z7901 Long term (current) use of anticoagulants: Secondary | ICD-10-CM | POA: Diagnosis not present

## 2017-01-07 MED FILL — SIMVASTATIN 20 MG TABLET: 20 | 90 days supply | Qty: 90 | Fill #1

## 2017-01-13 MED FILL — PANTOPRAZOLE SOD DR 40 MG T: 40 | 90 days supply | Qty: 90 | Fill #0

## 2017-01-13 MED FILL — WARFARIN SODIUM 5 MG TABLET: 5 | 90 days supply | Qty: 135 | Fill #1

## 2017-01-13 MED FILL — SERTRALINE HCL 100 MG TAB: 100 | 90 days supply | Qty: 180 | Fill #0

## 2017-01-27 DIAGNOSIS — F39 Unspecified mood [affective] disorder: Secondary | ICD-10-CM | POA: Diagnosis not present

## 2017-01-27 DIAGNOSIS — Z7901 Long term (current) use of anticoagulants: Secondary | ICD-10-CM | POA: Diagnosis not present

## 2017-02-02 MED FILL — SHINGRIX VIAL KIT: 50 | 1 days supply | Qty: 1 | Fill #1

## 2017-02-09 DIAGNOSIS — F0632 Mood disorder due to known physiological condition with major depressive-like episode: Secondary | ICD-10-CM | POA: Diagnosis not present

## 2017-02-11 MED FILL — MIRTAZAPINE 30 MG TABLET: 30 | 90 days supply | Qty: 90 | Fill #0

## 2017-02-16 MED FILL — CHOLESTYRAMINE PACKET: 4 | 30 days supply | Qty: 60 | Fill #2

## 2017-02-27 DIAGNOSIS — Z7901 Long term (current) use of anticoagulants: Secondary | ICD-10-CM | POA: Diagnosis not present

## 2017-03-17 DIAGNOSIS — F330 Major depressive disorder, recurrent, mild: Secondary | ICD-10-CM | POA: Diagnosis not present

## 2017-03-27 DIAGNOSIS — Z7901 Long term (current) use of anticoagulants: Secondary | ICD-10-CM | POA: Diagnosis not present

## 2017-04-08 MED FILL — SIMVASTATIN 20 MG TABLET: 20 | 90 days supply | Qty: 90 | Fill #2

## 2017-04-13 MED FILL — SERTRALINE HCL 100 MG TAB: 100 | 90 days supply | Qty: 180 | Fill #1

## 2017-04-13 MED FILL — PANTOPRAZOLE SOD DR 40 MG T: 40 | 90 days supply | Qty: 90 | Fill #1

## 2017-04-17 MED FILL — CHOLESTYRAMINE PACKET: 4 | 30 days supply | Qty: 60 | Fill #0

## 2017-04-23 DIAGNOSIS — Z7901 Long term (current) use of anticoagulants: Secondary | ICD-10-CM | POA: Diagnosis not present

## 2017-05-04 DIAGNOSIS — E782 Mixed hyperlipidemia: Secondary | ICD-10-CM | POA: Diagnosis not present

## 2017-05-04 DIAGNOSIS — R197 Diarrhea, unspecified: Secondary | ICD-10-CM | POA: Diagnosis not present

## 2017-05-04 DIAGNOSIS — K219 Gastro-esophageal reflux disease without esophagitis: Secondary | ICD-10-CM | POA: Diagnosis not present

## 2017-05-04 DIAGNOSIS — I2699 Other pulmonary embolism without acute cor pulmonale: Secondary | ICD-10-CM | POA: Diagnosis not present

## 2017-05-04 DIAGNOSIS — Z148 Genetic carrier of other disease: Secondary | ICD-10-CM | POA: Diagnosis not present

## 2017-05-04 DIAGNOSIS — N182 Chronic kidney disease, stage 2 (mild): Secondary | ICD-10-CM | POA: Diagnosis not present

## 2017-05-04 DIAGNOSIS — I82401 Acute embolism and thrombosis of unspecified deep veins of right lower extremity: Secondary | ICD-10-CM | POA: Diagnosis not present

## 2017-05-04 DIAGNOSIS — F324 Major depressive disorder, single episode, in partial remission: Secondary | ICD-10-CM | POA: Diagnosis not present

## 2017-05-04 MED FILL — WARFARIN SODIUM 5 MG TABLET: 5 | 90 days supply | Qty: 135 | Fill #2

## 2017-05-19 DIAGNOSIS — M25571 Pain in right ankle and joints of right foot: Secondary | ICD-10-CM | POA: Diagnosis not present

## 2017-05-19 DIAGNOSIS — F330 Major depressive disorder, recurrent, mild: Secondary | ICD-10-CM | POA: Diagnosis not present

## 2017-05-21 DIAGNOSIS — Z7901 Long term (current) use of anticoagulants: Secondary | ICD-10-CM | POA: Diagnosis not present

## 2017-05-25 MED FILL — MIRTAZAPINE 30 MG TABLET: 30 | 90 days supply | Qty: 90 | Fill #1

## 2017-06-17 MED FILL — CHOLESTYRAMINE PACKET: 4 | 30 days supply | Qty: 60 | Fill #1

## 2017-06-18 DIAGNOSIS — Z7901 Long term (current) use of anticoagulants: Secondary | ICD-10-CM | POA: Diagnosis not present

## 2017-06-24 MED FILL — CHLORHEXIDINE 0.12% RINSE: 0.12 | 17 days supply | Qty: 473 | Fill #0

## 2017-06-24 MED FILL — PREVIDENT 5000 BOOSTER PLUS: 1.1 | 30 days supply | Qty: 100 | Fill #0

## 2017-07-06 MED FILL — SIMVASTATIN 20 MG TABLET: 20 | 90 days supply | Qty: 90 | Fill #0

## 2017-07-13 MED FILL — PANTOPRAZOLE SOD DR 40 MG T: 40 | 90 days supply | Qty: 90 | Fill #2

## 2017-07-13 MED FILL — SERTRALINE HCL 100 MG TAB: 100 | 90 days supply | Qty: 180 | Fill #0

## 2017-07-14 DIAGNOSIS — F330 Major depressive disorder, recurrent, mild: Secondary | ICD-10-CM | POA: Diagnosis not present

## 2017-07-16 DIAGNOSIS — Z7901 Long term (current) use of anticoagulants: Secondary | ICD-10-CM | POA: Diagnosis not present

## 2017-07-16 DIAGNOSIS — I82401 Acute embolism and thrombosis of unspecified deep veins of right lower extremity: Secondary | ICD-10-CM | POA: Diagnosis not present

## 2017-07-16 DIAGNOSIS — I2699 Other pulmonary embolism without acute cor pulmonale: Secondary | ICD-10-CM | POA: Diagnosis not present

## 2017-08-03 MED FILL — CHLORHEXIDINE 0.12% RINSE: 0.12 | 30 days supply | Qty: 473 | Fill #1

## 2017-08-17 DIAGNOSIS — Z7901 Long term (current) use of anticoagulants: Secondary | ICD-10-CM | POA: Diagnosis not present

## 2017-08-19 MED FILL — CHOLESTYRAMINE PACKET: 4 | 30 days supply | Qty: 60 | Fill #2

## 2017-08-24 MED FILL — WARFARIN SODIUM 5 MG TABLET: 5 | 90 days supply | Qty: 135 | Fill #3

## 2017-08-24 MED FILL — MIRTAZAPINE 30 MG TABLET: 30 | 90 days supply | Qty: 90 | Fill #2
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
## 2017-09-14 DIAGNOSIS — Z7901 Long term (current) use of anticoagulants: Secondary | ICD-10-CM | POA: Diagnosis not present

## 2017-09-22 DIAGNOSIS — F330 Major depressive disorder, recurrent, mild: Secondary | ICD-10-CM | POA: Diagnosis not present

## 2017-10-01 DIAGNOSIS — H524 Presbyopia: Secondary | ICD-10-CM | POA: Diagnosis not present

## 2017-10-05 DIAGNOSIS — H43812 Vitreous degeneration, left eye: Secondary | ICD-10-CM | POA: Diagnosis not present

## 2017-10-05 DIAGNOSIS — T8529XA Other mechanical complication of intraocular lens, initial encounter: Secondary | ICD-10-CM | POA: Diagnosis not present

## 2017-10-05 DIAGNOSIS — H35371 Puckering of macula, right eye: Secondary | ICD-10-CM | POA: Diagnosis not present

## 2017-10-06 MED FILL — PREDNISOLONE AC 1% EYE DROP: 1 | 75 days supply | Qty: 15 | Fill #0

## 2017-10-06 MED FILL — SIMVASTATIN 20 MG TABLET: 20 | 90 days supply | Qty: 90 | Fill #1

## 2017-10-06 MED FILL — SERTRALINE HCL 100 MG TAB: 100 | 90 days supply | Qty: 180 | Fill #1

## 2017-10-06 MED FILL — OFLOXACIN 0.3% EYE DROPS: 0.3 | 25 days supply | Qty: 5 | Fill #0

## 2017-10-12 ENCOUNTER — Other Ambulatory Visit: Payer: Self-pay | Admitting: Nurse Practitioner

## 2017-10-12 DIAGNOSIS — Z7901 Long term (current) use of anticoagulants: Secondary | ICD-10-CM | POA: Diagnosis not present

## 2017-10-12 DIAGNOSIS — N644 Mastodynia: Secondary | ICD-10-CM

## 2017-10-12 MED FILL — DOXYCYCLINE HYCLATE 100 MG: 100 | 10 days supply | Qty: 20 | Fill #0

## 2017-10-13 MED FILL — PANTOPRAZOLE SOD DR 40 MG T: 40 | 90 days supply | Qty: 90 | Fill #3

## 2017-10-16 ENCOUNTER — Ambulatory Visit: Payer: 59

## 2017-10-16 ENCOUNTER — Ambulatory Visit
Admission: RE | Admit: 2017-10-16 | Discharge: 2017-10-16 | Disposition: A | Discharge status: Home / Self Care | Payer: 59 | Source: Ambulatory Visit | Attending: Nurse Practitioner | Admitting: Nurse Practitioner

## 2017-10-16 DIAGNOSIS — N644 Mastodynia: Secondary | ICD-10-CM

## 2017-10-16 DIAGNOSIS — R928 Other abnormal and inconclusive findings on diagnostic imaging of breast: Secondary | ICD-10-CM | POA: Diagnosis not present

## 2017-10-28 MED FILL — CHOLESTYRAMINE PACKET: 4 | 30 days supply | Qty: 60 | Fill #0

## 2017-10-29 MED FILL — CHLORHEXIDINE 0.12% RINSE: 0.12 | 30 days supply | Qty: 473 | Fill #2

## 2017-10-30 DIAGNOSIS — T8529XD Other mechanical complication of intraocular lens, subsequent encounter: Secondary | ICD-10-CM | POA: Diagnosis not present

## 2017-10-30 DIAGNOSIS — T8522XA Displacement of intraocular lens, initial encounter: Secondary | ICD-10-CM | POA: Diagnosis not present

## 2017-10-30 DIAGNOSIS — H43311 Vitreous membranes and strands, right eye: Secondary | ICD-10-CM | POA: Diagnosis not present

## 2017-10-30 DIAGNOSIS — H2701 Aphakia, right eye: Secondary | ICD-10-CM | POA: Diagnosis not present

## 2017-10-30 DIAGNOSIS — H43391 Other vitreous opacities, right eye: Secondary | ICD-10-CM | POA: Diagnosis not present

## 2017-11-02 DIAGNOSIS — Z148 Genetic carrier of other disease: Secondary | ICD-10-CM | POA: Diagnosis not present

## 2017-11-02 DIAGNOSIS — I82401 Acute embolism and thrombosis of unspecified deep veins of right lower extremity: Secondary | ICD-10-CM | POA: Diagnosis not present

## 2017-11-02 DIAGNOSIS — I2699 Other pulmonary embolism without acute cor pulmonale: Secondary | ICD-10-CM | POA: Diagnosis not present

## 2017-11-02 DIAGNOSIS — Z23 Encounter for immunization: Secondary | ICD-10-CM | POA: Diagnosis not present

## 2017-11-02 DIAGNOSIS — E538 Deficiency of other specified B group vitamins: Secondary | ICD-10-CM | POA: Diagnosis not present

## 2017-11-02 DIAGNOSIS — Z1389 Encounter for screening for other disorder: Secondary | ICD-10-CM | POA: Diagnosis not present

## 2017-11-02 DIAGNOSIS — Z7901 Long term (current) use of anticoagulants: Secondary | ICD-10-CM | POA: Diagnosis not present

## 2017-11-02 DIAGNOSIS — F324 Major depressive disorder, single episode, in partial remission: Secondary | ICD-10-CM | POA: Diagnosis not present

## 2017-11-02 DIAGNOSIS — N182 Chronic kidney disease, stage 2 (mild): Secondary | ICD-10-CM | POA: Diagnosis not present

## 2017-11-02 DIAGNOSIS — Z0000 Encounter for general adult medical examination without abnormal findings: Secondary | ICD-10-CM | POA: Diagnosis not present

## 2017-11-02 DIAGNOSIS — K219 Gastro-esophageal reflux disease without esophagitis: Secondary | ICD-10-CM | POA: Diagnosis not present

## 2017-11-02 DIAGNOSIS — Z125 Encounter for screening for malignant neoplasm of prostate: Secondary | ICD-10-CM | POA: Diagnosis not present

## 2017-11-02 DIAGNOSIS — E782 Mixed hyperlipidemia: Secondary | ICD-10-CM | POA: Diagnosis not present

## 2017-11-10 DIAGNOSIS — Z7901 Long term (current) use of anticoagulants: Secondary | ICD-10-CM | POA: Diagnosis not present

## 2017-11-23 MED FILL — MIRTAZAPINE 30 MG TABLET: 30 | 90 days supply | Qty: 90 | Fill #3

## 2017-12-07 MED FILL — WARFARIN SODIUM 5 MG TABLET: 5 | 84 days supply | Qty: 96 | Fill #0

## 2017-12-08 DIAGNOSIS — Z7901 Long term (current) use of anticoagulants: Secondary | ICD-10-CM | POA: Diagnosis not present

## 2017-12-08 MED FILL — PHYTONADIONE 5 MG TABS: 5 | 1 days supply | Qty: 1 | Fill #0

## 2017-12-09 DIAGNOSIS — Z7901 Long term (current) use of anticoagulants: Secondary | ICD-10-CM | POA: Diagnosis not present

## 2017-12-11 DIAGNOSIS — Z7901 Long term (current) use of anticoagulants: Secondary | ICD-10-CM | POA: Diagnosis not present

## 2017-12-24 DIAGNOSIS — F330 Major depressive disorder, recurrent, mild: Secondary | ICD-10-CM | POA: Diagnosis not present

## 2018-01-01 MED FILL — CHOLESTYRAMINE PACKET: 4 | 30 days supply | Qty: 60 | Fill #1

## 2018-01-05 MED FILL — SIMVASTATIN 20 MG TABLET: 20 | 90 days supply | Qty: 90 | Fill #2

## 2018-01-07 DIAGNOSIS — Z7901 Long term (current) use of anticoagulants: Secondary | ICD-10-CM | POA: Diagnosis not present

## 2018-01-11 MED FILL — SERTRALINE HCL 100 MG TAB: 100 | 90 days supply | Qty: 180 | Fill #2

## 2018-01-14 MED FILL — PANTOPRAZOLE SOD DR 40 MG T: 40 | 90 days supply | Qty: 90 | Fill #0

## 2018-01-27 DIAGNOSIS — F330 Major depressive disorder, recurrent, mild: Secondary | ICD-10-CM | POA: Diagnosis not present

## 2018-01-28 DIAGNOSIS — Z7901 Long term (current) use of anticoagulants: Secondary | ICD-10-CM | POA: Diagnosis not present

## 2018-01-28 MED FILL — MIRTAZAPINE 30 MG TABLET: 30 | 10 days supply | Qty: 10 | Fill #0

## 2018-02-05 MED FILL — MIRTAZAPINE 30 MG TABLET: 30 | 90 days supply | Qty: 90 | Fill #0

## 2018-02-08 DIAGNOSIS — F0632 Mood disorder due to known physiological condition with major depressive-like episode: Secondary | ICD-10-CM | POA: Diagnosis not present

## 2018-02-15 MED FILL — CHLORHEXIDINE 0.12% RINSE: 0.12 | 30 days supply | Qty: 473 | Fill #3

## 2018-02-26 DIAGNOSIS — Z7901 Long term (current) use of anticoagulants: Secondary | ICD-10-CM | POA: Diagnosis not present

## 2018-03-01 MED FILL — CHOLESTYRAMINE PACKET: 4 | 30 days supply | Qty: 60 | Fill #2

## 2018-03-01 MED FILL — WARFARIN SODIUM 5 MG TABLET: 5 | 84 days supply | Qty: 96 | Fill #1 | Status: TO

## 2018-03-03 DIAGNOSIS — F330 Major depressive disorder, recurrent, mild: Secondary | ICD-10-CM | POA: Diagnosis not present

## 2018-04-01 DIAGNOSIS — Z7901 Long term (current) use of anticoagulants: Secondary | ICD-10-CM | POA: Diagnosis not present

## 2018-04-03 MED FILL — SIMVASTATIN 20 MG TABLET: 20 | 90 days supply | Qty: 90 | Fill #0

## 2018-04-21 MED FILL — SERTRALINE HCL 100 MG TAB: 100 | 90 days supply | Qty: 180 | Fill #0

## 2018-04-21 MED FILL — PANTOPRAZOLE SOD DR 40 MG T: 40 | 90 days supply | Qty: 90 | Fill #0

## 2018-04-28 MED FILL — MIRTAZAPINE 30 MG TABLET: 30 | 90 days supply | Qty: 90 | Fill #0

## 2018-04-30 DIAGNOSIS — Z7901 Long term (current) use of anticoagulants: Secondary | ICD-10-CM | POA: Diagnosis not present

## 2018-05-04 DIAGNOSIS — E782 Mixed hyperlipidemia: Secondary | ICD-10-CM | POA: Diagnosis not present

## 2018-05-04 DIAGNOSIS — F324 Major depressive disorder, single episode, in partial remission: Secondary | ICD-10-CM | POA: Diagnosis not present

## 2018-05-04 DIAGNOSIS — E213 Hyperparathyroidism, unspecified: Secondary | ICD-10-CM | POA: Diagnosis not present

## 2018-05-04 DIAGNOSIS — N182 Chronic kidney disease, stage 2 (mild): Secondary | ICD-10-CM | POA: Diagnosis not present

## 2018-05-04 DIAGNOSIS — K219 Gastro-esophageal reflux disease without esophagitis: Secondary | ICD-10-CM | POA: Diagnosis not present

## 2018-05-04 DIAGNOSIS — I82401 Acute embolism and thrombosis of unspecified deep veins of right lower extremity: Secondary | ICD-10-CM | POA: Diagnosis not present

## 2018-05-04 DIAGNOSIS — I2699 Other pulmonary embolism without acute cor pulmonale: Secondary | ICD-10-CM | POA: Diagnosis not present

## 2018-05-04 DIAGNOSIS — Z148 Genetic carrier of other disease: Secondary | ICD-10-CM | POA: Diagnosis not present

## 2018-05-07 MED FILL — CHOLESTYRAMINE PACKET: 4 | 30 days supply | Qty: 60 | Fill #0

## 2018-05-10 MED FILL — WARFARIN SODIUM 5 MG TABLET: 5 | 84 days supply | Qty: 96 | Fill #0

## 2018-05-27 DIAGNOSIS — Z7901 Long term (current) use of anticoagulants: Secondary | ICD-10-CM | POA: Diagnosis not present

## 2018-06-28 MED FILL — SIMVASTATIN 20 MG TABLET: 20 | 90 days supply | Qty: 90 | Fill #0

## 2018-07-08 DIAGNOSIS — Z7901 Long term (current) use of anticoagulants: Secondary | ICD-10-CM | POA: Diagnosis not present

## 2018-07-08 MED FILL — CHLORHEXIDINE 0.12% RINSE: 0.12 | 17 days supply | Qty: 473 | Fill #0

## 2018-07-08 MED FILL — PREVIDENT 5000 BOOSTER PLUS: 1.1 | 30 days supply | Qty: 100 | Fill #0

## 2018-07-12 MED FILL — PANTOPRAZOLE SOD DR 40 MG T: 40 | 90 days supply | Qty: 90 | Fill #0

## 2018-07-12 MED FILL — CHOLESTYRAMINE PACKET: 4 | 30 days supply | Qty: 60 | Fill #0

## 2018-07-19 MED FILL — SERTRALINE HCL 100 MG TAB: 100 | 90 days supply | Qty: 180 | Fill #0

## 2018-07-21 MED FILL — MIRTAZAPINE 30 MG TABLET: 30 | 90 days supply | Qty: 90 | Fill #0

## 2018-07-29 DIAGNOSIS — Z7901 Long term (current) use of anticoagulants: Secondary | ICD-10-CM | POA: Diagnosis not present

## 2018-07-29 DIAGNOSIS — I2699 Other pulmonary embolism without acute cor pulmonale: Secondary | ICD-10-CM | POA: Diagnosis not present

## 2018-07-29 MED FILL — CLINDAMYCIN HCL 150 MG CAPS: 150 | 8 days supply | Qty: 30 | Fill #0

## 2018-08-02 MED FILL — MIRTAZAPINE 30 MG TABLET: 30 | 90 days supply | Qty: 90 | Fill #0

## 2018-08-02 MED FILL — WARFARIN SODIUM 5 MG TABLET: 5 | 84 days supply | Qty: 96 | Fill #0

## 2018-08-20 DIAGNOSIS — F0632 Mood disorder due to known physiological condition with major depressive-like episode: Secondary | ICD-10-CM | POA: Diagnosis not present

## 2018-08-20 MED FILL — MIRTAZAPINE 45 MG TABLET: 45 | 90 days supply | Qty: 90 | Fill #0

## 2018-08-26 DIAGNOSIS — Z7901 Long term (current) use of anticoagulants: Secondary | ICD-10-CM | POA: Diagnosis not present

## 2018-09-02 DIAGNOSIS — Z7901 Long term (current) use of anticoagulants: Secondary | ICD-10-CM | POA: Diagnosis not present

## 2018-09-10 DIAGNOSIS — Z20828 Contact with and (suspected) exposure to other viral communicable diseases: Secondary | ICD-10-CM | POA: Diagnosis not present

## 2018-09-10 DIAGNOSIS — J189 Pneumonia, unspecified organism: Secondary | ICD-10-CM | POA: Diagnosis not present

## 2018-09-10 DIAGNOSIS — R509 Fever, unspecified: Secondary | ICD-10-CM | POA: Diagnosis not present

## 2018-09-10 DIAGNOSIS — R05 Cough: Secondary | ICD-10-CM | POA: Diagnosis not present

## 2018-09-20 MED FILL — SIMVASTATIN 20 MG TABLET: 20 | 90 days supply | Qty: 90 | Fill #1

## 2018-09-23 ENCOUNTER — Ambulatory Visit
Admission: RE | Admit: 2018-09-23 | Discharge: 2018-09-23 | Disposition: A | Discharge status: Home / Self Care | Payer: 59 | Source: Ambulatory Visit | Attending: Internal Medicine | Admitting: Internal Medicine

## 2018-09-23 ENCOUNTER — Other Ambulatory Visit: Payer: Self-pay | Admitting: Internal Medicine

## 2018-09-23 DIAGNOSIS — Z7901 Long term (current) use of anticoagulants: Secondary | ICD-10-CM | POA: Diagnosis not present

## 2018-09-23 DIAGNOSIS — J189 Pneumonia, unspecified organism: Secondary | ICD-10-CM | POA: Diagnosis not present

## 2018-09-28 MED FILL — CHOLESTYRAMINE PACKET: 4 | 30 days supply | Qty: 60 | Fill #1

## 2018-10-07 DIAGNOSIS — Z7901 Long term (current) use of anticoagulants: Secondary | ICD-10-CM | POA: Diagnosis not present

## 2018-10-11 MED FILL — PANTOPRAZOLE SOD DR 40 MG T: 40 | 90 days supply | Qty: 90 | Fill #1

## 2018-10-11 MED FILL — WARFARIN SODIUM 5 MG TABLET: 5 | 84 days supply | Qty: 96 | Fill #1

## 2018-10-11 MED FILL — SERTRALINE HCL 100 MG TAB: 100 | 90 days supply | Qty: 180 | Fill #1

## 2018-10-25 MED FILL — CHLORHEXIDINE 0.12% RINSE: 0.12 | 17 days supply | Qty: 473 | Fill #1

## 2018-11-04 DIAGNOSIS — Z7901 Long term (current) use of anticoagulants: Secondary | ICD-10-CM | POA: Diagnosis not present

## 2018-11-12 DIAGNOSIS — Z23 Encounter for immunization: Secondary | ICD-10-CM | POA: Diagnosis not present

## 2018-11-12 DIAGNOSIS — Z125 Encounter for screening for malignant neoplasm of prostate: Secondary | ICD-10-CM | POA: Diagnosis not present

## 2018-11-12 DIAGNOSIS — Z1389 Encounter for screening for other disorder: Secondary | ICD-10-CM | POA: Diagnosis not present

## 2018-11-12 DIAGNOSIS — Z0000 Encounter for general adult medical examination without abnormal findings: Secondary | ICD-10-CM | POA: Diagnosis not present

## 2018-11-12 DIAGNOSIS — Z148 Genetic carrier of other disease: Secondary | ICD-10-CM | POA: Diagnosis not present

## 2018-11-12 DIAGNOSIS — K2100 Gastro-esophageal reflux disease with esophagitis, without bleeding: Secondary | ICD-10-CM | POA: Diagnosis not present

## 2018-11-12 DIAGNOSIS — I2699 Other pulmonary embolism without acute cor pulmonale: Secondary | ICD-10-CM | POA: Diagnosis not present

## 2018-11-12 DIAGNOSIS — N182 Chronic kidney disease, stage 2 (mild): Secondary | ICD-10-CM | POA: Diagnosis not present

## 2018-11-12 DIAGNOSIS — F322 Major depressive disorder, single episode, severe without psychotic features: Secondary | ICD-10-CM | POA: Diagnosis not present

## 2018-11-12 DIAGNOSIS — J309 Allergic rhinitis, unspecified: Secondary | ICD-10-CM | POA: Diagnosis not present

## 2018-11-12 DIAGNOSIS — E782 Mixed hyperlipidemia: Secondary | ICD-10-CM | POA: Diagnosis not present

## 2018-11-12 DIAGNOSIS — E538 Deficiency of other specified B group vitamins: Secondary | ICD-10-CM | POA: Diagnosis not present

## 2018-11-12 DIAGNOSIS — Z7901 Long term (current) use of anticoagulants: Secondary | ICD-10-CM | POA: Diagnosis not present

## 2018-11-16 MED FILL — MIRTAZAPINE 45 MG TABLET: 45 | 90 days supply | Qty: 90 | Fill #1

## 2018-11-30 DIAGNOSIS — Z7901 Long term (current) use of anticoagulants: Secondary | ICD-10-CM | POA: Diagnosis not present

## 2018-11-30 MED FILL — CHOLESTYRAMINE PACKET: 4 | 30 days supply | Qty: 60 | Fill #0

## 2018-12-20 MED FILL — SIMVASTATIN 20 MG TABLET: 20 | 90 days supply | Qty: 90 | Fill #2

## 2018-12-28 DIAGNOSIS — Z7901 Long term (current) use of anticoagulants: Secondary | ICD-10-CM | POA: Diagnosis not present

## 2019-01-03 MED FILL — PANTOPRAZOLE SOD DR 40 MG T: 40 | 90 days supply | Qty: 90 | Fill #0

## 2019-01-03 MED FILL — SERTRALINE HCL 100 MG TAB: 100 | 90 days supply | Qty: 180 | Fill #2

## 2019-01-03 MED FILL — WARFARIN SODIUM 5 MG TABLET: 5 | 84 days supply | Qty: 96 | Fill #0

## 2019-01-04 MED FILL — CHLORHEXIDINE 0.12% RINSE: 0.12 | 17 days supply | Qty: 473 | Fill #2

## 2019-01-17 DIAGNOSIS — R7989 Other specified abnormal findings of blood chemistry: Secondary | ICD-10-CM | POA: Diagnosis not present

## 2019-01-17 DIAGNOSIS — R946 Abnormal results of thyroid function studies: Secondary | ICD-10-CM | POA: Diagnosis not present

## 2019-01-21 MED FILL — LEVOTHYROXINE 50 MCG TABLET: 50 | 30 days supply | Qty: 30 | Fill #0

## 2019-01-24 DIAGNOSIS — F0632 Mood disorder due to known physiological condition with major depressive-like episode: Secondary | ICD-10-CM | POA: Diagnosis not present

## 2019-01-25 DIAGNOSIS — Z7901 Long term (current) use of anticoagulants: Secondary | ICD-10-CM | POA: Diagnosis not present

## 2019-01-31 MED FILL — CHOLESTYRAMINE PACKET: 4 | 30 days supply | Qty: 60 | Fill #1

## 2019-02-15 MED FILL — LEVOTHYROXINE 50 MCG TABLET: 50 | 30 days supply | Qty: 30 | Fill #1

## 2019-03-02 DIAGNOSIS — E039 Hypothyroidism, unspecified: Secondary | ICD-10-CM | POA: Diagnosis not present

## 2019-03-02 DIAGNOSIS — Z7901 Long term (current) use of anticoagulants: Secondary | ICD-10-CM | POA: Diagnosis not present

## 2019-03-14 MED FILL — CHLORHEXIDINE 0.12% RINSE: 0.12 | 17 days supply | Qty: 473 | Fill #3

## 2019-03-14 MED FILL — LEVOTHYROXINE 50 MCG TABLET: 50 | 30 days supply | Qty: 30 | Fill #2

## 2019-03-21 MED FILL — WARFARIN SODIUM 5 MG TABLET: 5 | 84 days supply | Qty: 96 | Fill #1

## 2019-03-22 MED FILL — SIMVASTATIN 20 MG TABLET: 20 | 90 days supply | Qty: 90 | Fill #0

## 2019-03-22 MED FILL — SIMVASTATIN 20 MG TABLET: 20 | 90 days supply | Qty: 90 | Fill #0 | Status: TO

## 2019-03-29 DIAGNOSIS — Z7901 Long term (current) use of anticoagulants: Secondary | ICD-10-CM | POA: Diagnosis not present

## 2019-04-04 MED FILL — CHOLESTYRAMINE PACKET: 4 | 30 days supply | Qty: 60 | Fill #2

## 2019-04-05 MED FILL — MIRTAZAPINE 45 MG TABLET: 45 | 90 days supply | Qty: 90 | Fill #0

## 2019-04-11 ENCOUNTER — Other Ambulatory Visit (HOSPITAL_COMMUNITY): Payer: Self-pay | Admitting: Psychiatry

## 2019-04-11 DIAGNOSIS — F330 Major depressive disorder, recurrent, mild: Secondary | ICD-10-CM | POA: Diagnosis not present

## 2019-04-11 MED FILL — buPROPion HCL ER (XL) 150 M: 150 | 30 days supply | Qty: 60 | Fill #0

## 2019-04-11 MED FILL — SERTRALINE HCL 100 MG TAB: 100 | 90 days supply | Qty: 180 | Fill #0

## 2019-04-12 MED FILL — LEVOTHYROXINE 50 MCG TABLET: 50 | 30 days supply | Qty: 30 | Fill #3

## 2019-04-12 MED FILL — PANTOPRAZOLE SOD DR 40 MG T: 40 | 90 days supply | Qty: 90 | Fill #1

## 2019-04-27 DIAGNOSIS — Z7901 Long term (current) use of anticoagulants: Secondary | ICD-10-CM | POA: Diagnosis not present

## 2019-05-09 MED FILL — LEVOTHYROXINE 50 MCG TABLET: 50 | 30 days supply | Qty: 30 | Fill #0

## 2019-05-11 DIAGNOSIS — Z7901 Long term (current) use of anticoagulants: Secondary | ICD-10-CM | POA: Diagnosis not present

## 2019-05-13 DIAGNOSIS — Z7901 Long term (current) use of anticoagulants: Secondary | ICD-10-CM | POA: Diagnosis not present

## 2019-05-20 DIAGNOSIS — Z7901 Long term (current) use of anticoagulants: Secondary | ICD-10-CM | POA: Diagnosis not present

## 2019-05-23 ENCOUNTER — Other Ambulatory Visit (HOSPITAL_COMMUNITY): Payer: Self-pay | Admitting: Internal Medicine

## 2019-05-23 MED FILL — buPROPion HCL ER (XL) 150 M: 150 | 30 days supply | Qty: 60 | Fill #1

## 2019-06-03 MED FILL — LEVOTHYROXINE 50 MCG TABLET: 50 | 30 days supply | Qty: 30 | Fill #1

## 2019-06-08 DIAGNOSIS — F0632 Mood disorder due to known physiological condition with major depressive-like episode: Secondary | ICD-10-CM | POA: Diagnosis not present

## 2019-06-08 MED FILL — ARIPiprazole 5 MG TABS: 5 | 30 days supply | Qty: 30 | Fill #0

## 2019-06-09 DIAGNOSIS — F330 Major depressive disorder, recurrent, mild: Secondary | ICD-10-CM | POA: Diagnosis not present

## 2019-06-17 DIAGNOSIS — Z7901 Long term (current) use of anticoagulants: Secondary | ICD-10-CM | POA: Diagnosis not present

## 2019-06-20 ENCOUNTER — Other Ambulatory Visit: Payer: Self-pay | Admitting: Internal Medicine

## 2019-06-20 DIAGNOSIS — F322 Major depressive disorder, single episode, severe without psychotic features: Secondary | ICD-10-CM | POA: Diagnosis not present

## 2019-06-20 DIAGNOSIS — R4182 Altered mental status, unspecified: Secondary | ICD-10-CM | POA: Diagnosis not present

## 2019-06-20 DIAGNOSIS — Z7901 Long term (current) use of anticoagulants: Secondary | ICD-10-CM | POA: Diagnosis not present

## 2019-06-20 DIAGNOSIS — R269 Unspecified abnormalities of gait and mobility: Secondary | ICD-10-CM | POA: Diagnosis not present

## 2019-06-22 ENCOUNTER — Ambulatory Visit
Admission: RE | Admit: 2019-06-22 | Discharge: 2019-06-22 | Disposition: A | Discharge status: Home / Self Care | Payer: PPO | Source: Ambulatory Visit | Attending: Internal Medicine | Admitting: Internal Medicine

## 2019-06-22 DIAGNOSIS — R4182 Altered mental status, unspecified: Secondary | ICD-10-CM | POA: Diagnosis not present

## 2019-06-22 MED FILL — WARFARIN SODIUM 5 MG TABLET: 5 | 84 days supply | Qty: 96 | Fill #2

## 2019-06-23 ENCOUNTER — Encounter: Payer: Self-pay | Admitting: Neurology

## 2019-07-04 MED FILL — SIMVASTATIN 20 MG TABLET: 20 | 90 days supply | Qty: 90 | Fill #1

## 2019-07-04 MED FILL — ARIPiprazole 5 MG TABS: 5 | 30 days supply | Qty: 30 | Fill #1

## 2019-07-14 DIAGNOSIS — F0632 Mood disorder due to known physiological condition with major depressive-like episode: Secondary | ICD-10-CM | POA: Diagnosis not present

## 2019-07-18 DIAGNOSIS — Z7901 Long term (current) use of anticoagulants: Secondary | ICD-10-CM | POA: Diagnosis not present

## 2019-07-18 MED FILL — LEVOTHYROXINE 50 MCG TABLET: 50 | 30 days supply | Qty: 30 | Fill #2

## 2019-07-18 MED FILL — CHLORHEXIDINE 0.12% RINSE: 0.12 | 16 days supply | Qty: 473 | Fill #0

## 2019-07-18 MED FILL — SERTRALINE HCL 100 MG TAB: 100 | 90 days supply | Qty: 180 | Fill #1

## 2019-07-18 MED FILL — PANTOPRAZOLE SOD DR 40 MG T: 40 | 90 days supply | Qty: 90 | Fill #2

## 2019-08-01 DIAGNOSIS — Z7901 Long term (current) use of anticoagulants: Secondary | ICD-10-CM | POA: Diagnosis not present

## 2019-08-01 MED FILL — ARIPiprazole 5 MG TABS: 5 | 30 days supply | Qty: 30 | Fill #2

## 2019-08-03 DIAGNOSIS — Z7901 Long term (current) use of anticoagulants: Secondary | ICD-10-CM | POA: Diagnosis not present

## 2019-08-15 DIAGNOSIS — Z7901 Long term (current) use of anticoagulants: Secondary | ICD-10-CM | POA: Diagnosis not present

## 2019-08-15 MED FILL — LEVOTHYROXINE 50 MCG TABLET: 50 | 30 days supply | Qty: 30 | Fill #3

## 2019-08-19 DIAGNOSIS — H9190 Unspecified hearing loss, unspecified ear: Secondary | ICD-10-CM | POA: Diagnosis not present

## 2019-08-19 DIAGNOSIS — Z87820 Personal history of traumatic brain injury: Secondary | ICD-10-CM | POA: Diagnosis not present

## 2019-08-19 DIAGNOSIS — H903 Sensorineural hearing loss, bilateral: Secondary | ICD-10-CM | POA: Diagnosis not present

## 2019-08-19 DIAGNOSIS — F0632 Mood disorder due to known physiological condition with major depressive-like episode: Secondary | ICD-10-CM | POA: Diagnosis not present

## 2019-08-19 DIAGNOSIS — F0390 Unspecified dementia without behavioral disturbance: Secondary | ICD-10-CM | POA: Diagnosis not present

## 2019-08-22 ENCOUNTER — Other Ambulatory Visit (HOSPITAL_COMMUNITY): Payer: Self-pay | Admitting: Psychiatry

## 2019-08-22 DIAGNOSIS — F0632 Mood disorder due to known physiological condition with major depressive-like episode: Secondary | ICD-10-CM | POA: Diagnosis not present

## 2019-08-23 ENCOUNTER — Other Ambulatory Visit: Payer: Self-pay

## 2019-08-23 ENCOUNTER — Ambulatory Visit: Payer: PPO | Admitting: Neurology

## 2019-08-23 ENCOUNTER — Encounter: Payer: Self-pay | Admitting: Neurology

## 2019-08-23 ENCOUNTER — Other Ambulatory Visit (INDEPENDENT_AMBULATORY_CARE_PROVIDER_SITE_OTHER): Payer: PPO

## 2019-08-23 VITALS — BP 133/70 | HR 84 | Ht 70.0 in | Wt 199.2 lb

## 2019-08-23 DIAGNOSIS — R292 Abnormal reflex: Secondary | ICD-10-CM

## 2019-08-23 DIAGNOSIS — R413 Other amnesia: Secondary | ICD-10-CM

## 2019-08-23 DIAGNOSIS — R29898 Other symptoms and signs involving the musculoskeletal system: Secondary | ICD-10-CM

## 2019-08-23 LAB — VITAMIN B12: Vitamin B-12: >1526 pg/mL — ABNORMAL HIGH (ref 211–911)

## 2019-08-23 NOTE — Patient Instructions (Signed)
1. Bloodwork for B12  2. Schedule MRI brain with and without contrast, MRI cervical spine with and without contrast  3. Schedule Neurocognitive testing  4. Refer for home physical therapy for left leg weakness and balance, and speech therapy for cognitive therapy  5. Follow-up in 4-5 months, call for any changes   RECOMMENDATIONS FOR ALL PATIENTS WITH MEMORY PROBLEMS: 1. Continue to exercise (Recommend 30 minutes of walking everyday, or 3 hours every week) 2. Increase social interactions - continue going to [place] and enjoy social gatherings with friends and family 3. Eat healthy, avoid fried foods and eat more fruits and vegetables 4. Maintain adequate blood pressure, blood sugar, and blood cholesterol level. Reducing the risk of stroke and cardiovascular disease also helps promoting better memory. 5. Avoid stressful situations. Live a simple life and avoid aggravations. Organize your time and prepare for the next day in anticipation. 6. Sleep well, avoid any interruptions of sleep and avoid any distractions in the bedroom that may interfere with adequate sleep quality 7. Avoid sugar, avoid sweets as there is a strong link between excessive sugar intake, diabetes, and cognitive impairment We discussed the Mediterranean diet, which has been shown to help patients reduce the risk of progressive memory disorders and reduces cardiovascular risk. This includes eating fish, eat fruits and green leafy vegetables, nuts like almonds and hazelnuts, walnuts, and also use olive oil. Avoid fast foods and fried foods as much as possible. Avoid sweets and sugar as sugar use has been linked to worsening of memory function.

## 2019-08-23 NOTE — Progress Notes (Signed)
NEUROLOGY CONSULTATION NOTE  Tyler Mason MRN: [id number] DOB: 1950-04-25  Referring provider: Dr. [name] Primary care provider: Dr. [name]  Reason for consult:  Altered mental status, staggering gait  Dear Dr Lysle Rubens:  Thank you for your kind referral of Tyler Mason for consultation of the above symptoms. Although his history is well known to you, please allow me to reiterate it for the purpose of our medical record. The patient was accompanied to the clinic by his wife who also provides collateral information. Records and images were personally reviewed where available.   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 His wife is present to provide additional history. He feels his memory is "not good." His wife reports he has had cognitive difficulties since his head injury in 1994, worse since May. She started noticing word-finding difficulties and gait changes and discussed these with his psychiatrist. He was started on Wellbutrin and "things went [place]," after stopping Wellbutrin, symptoms got a little better and he was started on Abilify a few weeks ago. She thinks some things are a little better since starting Abilify. She states he has been stoic for a long time, but since May he has been having more difficulty talking, saying what he wants to say. Sometimes words are not coming out quite right. Hearing has also become more of a problem, he has had a hearing evaluation recently. She has noticed his ability to move around has also slowed down, walking is slower. He has had more frequent falls since May. He has baseline left leg weakness since the SDH, but over the past few months, he reports his left foot never knows where it is going to sit down.  He denies getting lost driving. His wife manages finances. She started noticing last May that he was not doing a great job with his medications, he was getting his Coumadin doses mixed up with supratherapeutic INR. She started taking over medications at that time. He misplaces things frequently, which is not new. No difficulties operating the microwave or remote control. He is independent with dressing and bathing.They deny any urinary incontinence. No REM behavior disorder, paranoia, or hallucinations. He was having sleep difficulties, but this has improved with the Abilify. He denies any headaches, focal numbness/tingling/weakness, bowel dysfunction. He has neck and back pain. He has had horizontal diplopia since the SDH, with left lateral rectus palsy. He has had decreased sense of smell since the SDH. He gets lightheaded when standing sometimes. His last fall was a few weeks ago, he tripped over the laundry basket. Six week before that, he tripped over the dog. His mother had dementia. No alcohol use.   I personally reviewed head CT without contrast done 06/2019 no acute changes, diffuse atrophy and chronic microvascular disease   PAST MEDICAL HISTORY: Past Medical History:  Diagnosis Date  . Anxiety   . Blood dyscrasia    patient on chronic coumadin  . Depression   . DVT (deep venous thrombosis) ([place])   . GERD (gastroesophageal reflux disease)   . Kidney calculus   . PE (pulmonary embolism)    HX  OF PE  . Pneumonia    HX OF PNA  . SDH (subdural hematoma) ([place])   . Sleep apnea   . UTI (urinary tract infection)  FREQUENT    PAST SURGICAL HISTORY: Past Surgical History:  Procedure Laterality Date  . BRAIN SURGERY     head injury from tree limb   . COLONOSCOPY    . CYSTOSCOPY WITH RETROGRADE PYELOGRAM, URETEROSCOPY AND STENT PLACEMENT Left 10/27/2014   Procedure: CYSTOSCOPY WITH RETROGRADE PYELOGRAM, HOLMIUM LASER, BASKET STONE EXTRACTION, LEFT URETEROSCOPY AND DOUBLE J STENT  PLACEMENT;  Surgeon: Kathie Rhodes, MD;  Location: [facility];  Service: Urology;  Laterality: Left;  . LITHOTRIPSY    . ORIF ANKLE FRACTURE Right 03/09/2013   DR GRAVES  . ORIF ANKLE FRACTURE Right 03/09/2013   Procedure: OPEN REDUCTION INTERNAL FIXATION (ORIF) ANKLE FRACTURE right;  Surgeon: Alta Corning, MD;  Location: [facility];  Service: Orthopedics;  Laterality: Right;  . PARATHYROIDECTOMY Left 05/09/2016   Procedure: LEFT INFERIOR PARATHYROIDECTOMY;  Surgeon: [name], MD;  Location: [facility];  Service: General;  Laterality: Left;    MEDICATIONS: Current Outpatient Medications on File Prior to Visit  Medication Sig Dispense Refill  . ARIPiprazole (ABILIFY) 5 MG tablet Take 5 tablets by mouth daily.    Marland Kitchen CRANBERRY PO Take 1 tablet by mouth daily.    Marland Kitchen levothyroxine (SYNTHROID) 25 MCG tablet Take 1 tablet by mouth daily.     . pantoprazole (PROTONIX) 40 MG tablet Take 40 mg by mouth daily.    . sertraline (ZOLOFT) 100 MG tablet Take 200 mg by mouth daily after breakfast.     . simvastatin (ZOCOR) 20 MG tablet Take 20 mg by mouth at bedtime.      . vitamin B-12 (CYANOCOBALAMIN) 1000 MCG tablet Take 1,000 mcg by mouth daily.    Marland Kitchen warfarin (COUMADIN) 5 MG tablet Take 5-7.5 mg by mouth See admin instructions. Patient states he alternate days of taking the coumadin, he could not remember what doses he takes on certain days.  He states he taken 7.5mg  on Sunday night, then 5mg  would of been Monday then alternate from there.    Marland Kitchen HYDROcodone-acetaminophen (NORCO/VICODIN) 5-325 MG tablet Take 1-2 tablets by mouth every 4 (four) hours as needed for moderate pain. (Patient not taking: Reported on 08/23/2019) 20 tablet 0   No current facility-administered medications on file prior to visit.    ALLERGIES: Allergies  Allergen Reactions  . Ampicillin Other (See Comments)    Made tongue sore Has patient had a PCN reaction causing immediate rash, facial/tongue/throat swelling, SOB or lightheadedness with  hypotension: no Has patient had a PCN reaction causing severe rash involving mucus membranes or skin necrosis: no Has patient had a PCN reaction that required hospitalization no Has patient had a PCN reaction occurring within the last 10 years: no If all of the above answers are "NO", then may proceed with Cephalosporin use.   Clancy Gourd [Nitrofurantoin Macrocrystal] Itching, Swelling and Rash    FAMILY HISTORY: Family History  Problem Relation Age of Onset  . Hypertension Mother   . Thyroid disease Mother     SOCIAL HISTORY: Social History   Socioeconomic History  . Marital status: Married    Spouse name: Not on file  . Number of children: Not on file  . Years of education: Not on file  . Highest education level: Not on file  Occupational History  . Not on file  Tobacco Use  . Smoking status: Former Smoker    Packs/day: 1.00    Quit date: 01/01/2001    Years since quitting: 18.6  . Smokeless tobacco: Never Used  Vaping Use  . Vaping  Use: Never used  Substance and Sexual Activity  . Alcohol use: No  . Drug use: No  . Sexual activity: Yes    Birth control/protection: None  Other Topics Concern  . Not on file  Social History Narrative   Left handed    Lives with wife   Social Determinants of Health   Financial Resource Strain:   . Difficulty of Paying Living Expenses:   Food Insecurity:   . Worried About Charity fundraiser in the Last Year:   . Arboriculturist in the Last Year:   Transportation Needs:   . Film/video editor (Medical):   Marland Kitchen Lack of Transportation (Non-Medical):   Physical Activity:   . Days of Exercise per Week:   . Minutes of Exercise per Session:   Stress:   . Feeling of Stress :   Social Connections:   . Frequency of Communication with Friends and Family:   . Frequency of Social Gatherings with Friends and Family:   . Attends Religious Services:   . Active Member of Clubs or Organizations:   . Attends Archivist  Meetings:   Marland Kitchen Marital Status:   Intimate Partner Violence:   . Fear of Current or Ex-Partner:   . Emotionally Abused:   Marland Kitchen Physically Abused:   . Sexually Abused:     PHYSICAL EXAM: Vitals:   08/23/19 1347  BP: 133/70  Pulse: 84  SpO2: 93%   General: No acute distress Head:  Normocephalic/atraumatic Skin/Extremities: No rash, no edema Neurological Exam: Mental status: alert and oriented to person, place, and time, no dysarthria or aphasia, Fund of knowledge is appropriate.  Recent and remote memory are impaired.  Attention and concentration are normal.   SLUMS score 18/30 [employer] Mental Exam 08/23/2019  Weekday Correct 1  Current year 1  What state are we in? 1  Amount spent 1  Amount left 0  # of Animals 2  5 objects recall 2  Number series 1  Hour markers 1  Time correct 0  Placed X in triangle correctly 1  Largest Figure 1  Name of male 2  Date back to work 2  Type of work 2  State she lived in 0  Total score 18   Cranial nerves: CN I: not tested CN II: right pupil irregular, left pupil round. Visual fields intact CN III, IV, VI:  Intact EOMS on right, left lateral rectus palsy, no nystagmus, no ptosis CN V: facial sensation intact CN VII: upper and lower face symmetric CN VIII: hard of hearing, wearing hearing aids CN XII: tongue midline Bulk & Tone: normal, no fasciculations, no cogwheeling Motor: 5/5 throughout with no pronator drift. Sensation: intact to light touch, cold, pin, vibration and joint position sense.  No extinction to double simultaneous stimulation.  Romberg test negative Deep Tendon Reflexes: brisk +3 throughout with hyperactive pectoralis reflex bilaterally, negative Hoffman sign, no ankle clonus Plantar responses: downgoing bilaterally Cerebellar: no incoordination on finger to nose testing Gait: able to rise from chair with arms over chest, favors left leg, slightly dragging left leg.  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 His neurological exam shows gait instability favoring left leg, although individual muscle testing appears normal. No parkinsonian signs. SLUMS score  18/30. Etiology of symptoms unclear, he had cognitive issues post-SDH, however worse since May. MRI brain with and without contrast will be ordered to assess for underlying structural abnormality. Check B12 level. Neurocognitive testing will be done to further assess cognitive concerns. He reports left leg weakness also worse, reflexes brisk. MRI cervical spine with and without contrast will be ordered to assess for myelopathy. He will be referred for home PT and speech therapy for left leg weakness/balance/frequent falls, and cognitive therapy. Follow-up in 4-5 months, they know to call for any changes.   Thank you for allowing me to participate in the care of this patient. Please do not hesitate to call for any questions or concerns.   Ellouise Newer, M.D.  CC: Dr. Lysle Rubens

## 2019-08-24 ENCOUNTER — Telehealth: Payer: Self-pay

## 2019-08-24 NOTE — Telephone Encounter (Signed)
Called and spoke to patients wife and informed her of b12 results. Wife verbalized understanding.

## 2019-08-24 NOTE — Telephone Encounter (Signed)
-----   Message from [name], MD sent at 08/24/2019 10:25 AM EDT ----- Pls let wife know B12 level was good, continue daily supplements. Thanks

## 2019-09-05 ENCOUNTER — Other Ambulatory Visit (HOSPITAL_COMMUNITY): Payer: Self-pay | Admitting: Internal Medicine

## 2019-09-05 MED FILL — WARFARIN SODIUM 5 MG TABLET: 5 | 84 days supply | Qty: 96 | Fill #0

## 2019-09-05 MED FILL — ARIPiprazole 5 MG TABS: 5 | 30 days supply | Qty: 30 | Fill #3

## 2019-09-06 MED FILL — LEVOTHYROXINE 50 MCG TABLET: 50 | 7 days supply | Qty: 7 | Fill #4

## 2019-09-13 MED FILL — LEVOTHYROXINE 50 MCG TABLET: 50 | 30 days supply | Qty: 30 | Fill #5

## 2019-09-13 MED FILL — CHLORHEXIDINE 0.12% RINSE: 0.12 | 16 days supply | Qty: 473 | Fill #1

## 2019-09-15 ENCOUNTER — Ambulatory Visit
Admission: RE | Admit: 2019-09-15 | Discharge: 2019-09-15 | Disposition: A | Discharge status: Home / Self Care | Payer: PPO | Source: Ambulatory Visit | Attending: Neurology | Admitting: Neurology

## 2019-09-15 ENCOUNTER — Other Ambulatory Visit: Payer: Self-pay

## 2019-09-15 DIAGNOSIS — R292 Abnormal reflex: Secondary | ICD-10-CM

## 2019-09-15 DIAGNOSIS — R29898 Other symptoms and signs involving the musculoskeletal system: Secondary | ICD-10-CM

## 2019-09-15 DIAGNOSIS — R296 Repeated falls: Secondary | ICD-10-CM | POA: Diagnosis not present

## 2019-09-15 DIAGNOSIS — R413 Other amnesia: Secondary | ICD-10-CM

## 2019-09-15 DIAGNOSIS — S0990XA Unspecified injury of head, initial encounter: Secondary | ICD-10-CM | POA: Diagnosis not present

## 2019-09-15 MED ORDER — GADOBENATE DIMEGLUMINE 529 MG/ML IV SOLN
19.0000 mL | Freq: Once | INTRAVENOUS | Status: AC | PRN
  Administered 2019-09-15: 19 mL via INTRAVENOUS

## 2019-09-19 ENCOUNTER — Telehealth: Payer: Self-pay | Admitting: Neurology

## 2019-09-19 NOTE — Telephone Encounter (Signed)
Patient's wife states they were supposed to be referred for PT and Speech Therapy, as was discussed during his last visit with Dr Delice Lesch. They haven't heard from any offices regarding this. Please call.

## 2019-09-19 NOTE — Telephone Encounter (Signed)
Following up with Dian Situ with home health will call pt when I hear back from hi.

## 2019-09-20 ENCOUNTER — Other Ambulatory Visit: Payer: Self-pay

## 2019-09-20 DIAGNOSIS — R413 Other amnesia: Secondary | ICD-10-CM

## 2019-09-20 NOTE — Telephone Encounter (Signed)
Spoke with pt wife she stated that pt had used  [facility] in the past, message sent to [facility] to get pt therapy started. If they can not take him will send referral to clinic for therapy

## 2019-09-21 DIAGNOSIS — Z87891 Personal history of nicotine dependence: Secondary | ICD-10-CM | POA: Diagnosis not present

## 2019-09-21 DIAGNOSIS — Z87440 Personal history of urinary (tract) infections: Secondary | ICD-10-CM | POA: Diagnosis not present

## 2019-09-21 DIAGNOSIS — Z86711 Personal history of pulmonary embolism: Secondary | ICD-10-CM | POA: Diagnosis not present

## 2019-09-21 DIAGNOSIS — F329 Major depressive disorder, single episode, unspecified: Secondary | ICD-10-CM | POA: Diagnosis not present

## 2019-09-21 DIAGNOSIS — R292 Abnormal reflex: Secondary | ICD-10-CM | POA: Diagnosis not present

## 2019-09-21 DIAGNOSIS — E210 Primary hyperparathyroidism: Secondary | ICD-10-CM | POA: Diagnosis not present

## 2019-09-21 DIAGNOSIS — N200 Calculus of kidney: Secondary | ICD-10-CM | POA: Diagnosis not present

## 2019-09-21 DIAGNOSIS — R4182 Altered mental status, unspecified: Secondary | ICD-10-CM | POA: Diagnosis not present

## 2019-09-21 DIAGNOSIS — Z9181 History of falling: Secondary | ICD-10-CM | POA: Diagnosis not present

## 2019-09-21 DIAGNOSIS — G4730 Sleep apnea, unspecified: Secondary | ICD-10-CM | POA: Diagnosis not present

## 2019-09-21 DIAGNOSIS — F419 Anxiety disorder, unspecified: Secondary | ICD-10-CM | POA: Diagnosis not present

## 2019-09-21 DIAGNOSIS — H532 Diplopia: Secondary | ICD-10-CM | POA: Diagnosis not present

## 2019-09-21 DIAGNOSIS — R260 Ataxic gait: Secondary | ICD-10-CM | POA: Diagnosis not present

## 2019-09-21 DIAGNOSIS — E785 Hyperlipidemia, unspecified: Secondary | ICD-10-CM | POA: Diagnosis not present

## 2019-09-21 DIAGNOSIS — K219 Gastro-esophageal reflux disease without esophagitis: Secondary | ICD-10-CM | POA: Diagnosis not present

## 2019-09-21 DIAGNOSIS — Z7901 Long term (current) use of anticoagulants: Secondary | ICD-10-CM | POA: Diagnosis not present

## 2019-09-21 DIAGNOSIS — S065X0S Traumatic subdural hemorrhage without loss of consciousness, sequela: Secondary | ICD-10-CM | POA: Diagnosis not present

## 2019-09-23 ENCOUNTER — Encounter: Payer: Self-pay | Admitting: Psychology

## 2019-09-23 ENCOUNTER — Ambulatory Visit (INDEPENDENT_AMBULATORY_CARE_PROVIDER_SITE_OTHER): Payer: PPO | Admitting: Psychology

## 2019-09-23 ENCOUNTER — Ambulatory Visit: Payer: PPO | Admitting: Psychology

## 2019-09-23 ENCOUNTER — Other Ambulatory Visit: Payer: Self-pay

## 2019-09-23 DIAGNOSIS — F329 Major depressive disorder, single episode, unspecified: Secondary | ICD-10-CM | POA: Insufficient documentation

## 2019-09-23 DIAGNOSIS — F0390 Unspecified dementia without behavioral disturbance: Secondary | ICD-10-CM

## 2019-09-23 DIAGNOSIS — S065XAA Traumatic subdural hemorrhage with loss of consciousness status unknown, initial encounter: Secondary | ICD-10-CM

## 2019-09-23 DIAGNOSIS — S065X9A Traumatic subdural hemorrhage with loss of consciousness of unspecified duration, initial encounter: Secondary | ICD-10-CM

## 2019-09-23 DIAGNOSIS — F0150 Vascular dementia without behavioral disturbance: Secondary | ICD-10-CM | POA: Diagnosis not present

## 2019-09-23 DIAGNOSIS — R4189 Other symptoms and signs involving cognitive functions and awareness: Secondary | ICD-10-CM

## 2019-09-23 DIAGNOSIS — G4733 Obstructive sleep apnea (adult) (pediatric): Secondary | ICD-10-CM

## 2019-09-23 DIAGNOSIS — F411 Generalized anxiety disorder: Secondary | ICD-10-CM | POA: Insufficient documentation

## 2019-09-23 HISTORY — DX: Unspecified dementia, unspecified severity, without behavioral disturbance, psychotic disturbance, mood disturbance, and anxiety: F03.90

## 2019-09-23 NOTE — Progress Notes (Signed)
   Psychometrician Note   Cognitive testing was administered to Tyler Mason by Milana Kidney, B.S. (psychometrist) under the supervision of Dr. Christia Reading, Ph.D., licensed psychologist on 09/23/19. Tyler Mason did not appear overtly distressed by the testing session per behavioral observation or responses across self-report questionnaires. Dr. Christia Reading, Ph.D. checked in with Tyler Mason as needed to manage any distress related to testing procedures (if applicable). Rest breaks were offered.    The battery of tests administered was selected by Dr. Christia Reading, Ph.D. with consideration to Tyler Mason current level of functioning, the nature of his symptoms, emotional and behavioral responses during interview, level of literacy, observed level of motivation/effort, and the nature of the referral question. This battery was communicated to the psychometrist. Communication between Dr. Christia Reading, Ph.D. and the psychometrist was ongoing throughout the evaluation and Dr. Christia Reading, Ph.D. was immediately accessible at all times. Dr. Christia Reading, Ph.D. provided supervision to the psychometrist on the date of this service to the extent necessary to assure the quality of all services provided.    Tyler Mason will return within approximately 1-2 weeks for an interactive feedback session with Dr. Melvyn Novas at which time his test performances, clinical impressions, and treatment recommendations will be reviewed in detail. Tyler Mason understands he can contact our office should he require our assistance before this time.  A total of 175 minutes of billable time were spent face-to-face with Tyler Mason by the psychometrist. This includes both test administration and scoring time. Billing for these services is reflected in the clinical report generated by Dr. Christia Reading, Ph.D..  This note reflects time spent with the psychometrician and does not include test scores or any clinical  interpretations made by Dr. Melvyn Novas. The full report will follow in a separate note.

## 2019-09-23 NOTE — Progress Notes (Addendum)
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 while confrontation naming was average.   Assessed visuospatial/visuoconstructional abilities were average.    Learning (i.e., encoding) of novel verbal and visual information was exceptionally low. Spontaneous delayed recall (i.e., retrieval) of previously learned information was also exceptionally low. Retention rates were 33% (raw score of 2) across a story learning task, 0% across a list learning task, and 12% across a complex figure drawing task. Performance across recognition tasks was exceptionally low across a list learning task but below average across story and figure memory tasks, suggesting some potential evidence for information consolidation.  Fine motor speed and coordination was exceptionally low bilaterally.    Results of emotional screening instruments suggested that recent symptoms of generalized anxiety were in the minimal range, while symptoms of depression were within the mild range. A screening instrument assessing recent sleep quality suggested the presence of minimal sleep dysfunction.  Tables of Scores:   Note: This summary of test scores accompanies the interpretive report and should not be considered in isolation without reference to the  appropriate sections in the text. Descriptors are based on appropriate normative data and may be adjusted based on clinical judgment. The terms "impaired" and "within normal limits (WNL)" are used when a more specific level of functioning cannot be determined.       Effort Testing:   DESCRIPTOR       ACS Word Choice: --- --- Below Expectation  Dot Counting Test: --- --- Within Expectation  RBANS Effort Index: --- --- Below Expectation  WAIS-IV Reliable Digit Span: --- --- Within Expectation  D-KEFS Color Word Effort Index: --- --- Below Expectation       Orientation:      Raw Score Percentile   NAB Orientation, Form 1 27/29 --- ---       Cognitive Screening:           Raw Score Percentile   SLUMS: 18/30 --- ---       RBANS, Form A: Standard Score/ Scaled Score Percentile   Total Score 54 <1 Exceptionally Low  Immediate Memory 49 <1 Exceptionally Low    List Learning 1 <1 Exceptionally Low    Story Memory 3 1 Exceptionally Low  Visuospatial/Constructional 87 19 Below Average    Figure Copy 8 25 Average    Line Orientation 15/20 26-50 Average  Language 82 12 Below Average    Picture Naming 10/10 51-75 Average    Semantic Fluency 3 1 Exceptionally Low  Attention 49 <1 Exceptionally Low    Digit Span 3 1 Exceptionally Low    Coding 2 <1 Exceptionally Low  Delayed Memory 48 <1 Exceptionally Low    List Recall 0/10 <2 Exceptionally Low    List Recognition  14/20 <2 Exceptionally Low    Story Recall 3 1 Exceptionally Low    Story Recognition 9/12 16-26 Below Average    Figure Recall 2 <1 Exceptionally Low    Figure Recognition 5/8 21-29 Below Average       Intellectual Functioning:           Standard Score Percentile   Test of Premorbid Functioning: 85 16 Below Average       Attention/Executive Function:          Trail Making Test (TMT): Raw Score (T Score) Percentile     Part A 61 secs.,  0 errors (33) 5 Well Below Average    Part B 246 secs.,  0 errors (28) 2  Exceptionally Low         Scaled Score Percentile   WAIS-IV Digit Span: 5 5 Well Below Average    Forward 5 5 Well Below Average    Backward 8 25 Average    Sequencing 6 9 Below Average       D-KEFS Color-Word Interference Test: Raw Score (Scaled Score) Percentile     Color Naming 64 secs. (1) <1 Exceptionally Low    Word Reading 43 secs. (1) <1 Exceptionally Low    Inhibition 142 secs. (1) <1 Exceptionally Low      Total Errors 2 errors (10) 50 Average    Inhibition/Switching 122 secs. (4) 2 Well Below Average      Total Errors 3 errors (10) 50 Average       D-KEFS Verbal Fluency Test: Raw Score (Scaled Score) Percentile     Letter Total Correct 11 (3) 1 Exceptionally Low    Category Total Correct 15 (2) <1 Exceptionally Low    Category Switching Total Correct 7 (3) 1 Exceptionally Low    Category Switching Accuracy 6 (5) 5 Well Below Average      Total Set Loss Errors 1 (11) 63 Average      Total Repetition Errors 0 (13) 84 Above Average       D-KEFS Design Fluency Test: Raw Score (Scaled Score) Percentile     Condition 1 - Filled Dots 4 (6) 9 Below Average    Condition 2 - Empty Dots 2 (4) 2 Well Below Average    Condition 3 - Switching 2 (5) 5 Well Below Average      Total Set Loss Errors 0 (13) 84 Above Average      Total Repetition Errors 3 (12) 75 Above Average       NAB Executive Functions Module, Form 1: T Score Percentile     Judgment 42 21 Below Average       Language:           Raw Score Percentile   PPVT Screening Instrument: 14/16 --- Within Expectation  Sentence Repetition: 11/22 1 Exceptionally Low       Verbal Fluency Test: Raw Score (T Score) Percentile     Phonemic Fluency (FAS) 11 (21) <1 Exceptionally Low    Animal Fluency 10 (26) 1 Exceptionally Low        NAB Language Module, Form 1: T Score Percentile     Auditory Comprehension 57 75 Above Average    Naming 29/31 (44) 27 Average       Visuospatial/Visuoconstruction:      Raw Score  Percentile   Clock Drawing: 9/10 --- Within Normal Limits        Scaled Score Percentile   WAIS-IV Block Design: 8 25 Average  Sensory-Motor:          Ship broker Test: Raw Score Percentile     Dominant Hand 248 secs.,  0 drops  <1 Exceptionally Low    Non-Dominant Hand 275 secs.,  0 drops  <1 Exceptionally Low       Mood and Personality:      Raw Score Percentile   Geriatric Depression Scale: 16 --- Mild  Geriatric Anxiety Scale: 9 --- Minimal    Somatic 6 --- Mild    Cognitive 1 --- Minimal    Affective 2 --- Minimal       Additional Questionnaires:      Raw Score Percentile   PROMIS Sleep Disturbance Questionnaire: 12 --- None to Slight   Informed Consent and Coding/Compliance:   Mr. Headen was provided with a verbal description of the nature and purpose of the present neuropsychological evaluation. Also reviewed were the foreseeable risks and/or discomforts and benefits of the procedure, limits of confidentiality, and mandatory reporting requirements of this provider. The patient was given the opportunity to ask questions and receive answers about the evaluation. Oral consent to participate was provided by the patient.   This evaluation was conducted by Christia Reading, Ph.D., licensed clinical neuropsychologist. Mr. Hyndman completed a comprehensive clinical interview with Dr. Melvyn Novas, billed as one unit [phone], and 175 minutes of cognitive testing and scoring, billed as one unit [phone] and five additional units [id number]. Psychometrist Milana Kidney, B.S., assisted Dr. Melvyn Novas with test administration and scoring procedures. As a separate and discrete service, Dr. Melvyn Novas spent a total of 160 minutes in interpretation and report writing billed as one unit [phone] and two units [id number].

## 2019-09-27 DIAGNOSIS — G4730 Sleep apnea, unspecified: Secondary | ICD-10-CM | POA: Diagnosis not present

## 2019-09-27 DIAGNOSIS — Z87891 Personal history of nicotine dependence: Secondary | ICD-10-CM | POA: Diagnosis not present

## 2019-09-27 DIAGNOSIS — F329 Major depressive disorder, single episode, unspecified: Secondary | ICD-10-CM | POA: Diagnosis not present

## 2019-09-27 DIAGNOSIS — Z86711 Personal history of pulmonary embolism: Secondary | ICD-10-CM | POA: Diagnosis not present

## 2019-09-27 DIAGNOSIS — S065X0S Traumatic subdural hemorrhage without loss of consciousness, sequela: Secondary | ICD-10-CM | POA: Diagnosis not present

## 2019-09-27 DIAGNOSIS — H532 Diplopia: Secondary | ICD-10-CM | POA: Diagnosis not present

## 2019-09-27 DIAGNOSIS — N200 Calculus of kidney: Secondary | ICD-10-CM | POA: Diagnosis not present

## 2019-09-27 DIAGNOSIS — Z9181 History of falling: Secondary | ICD-10-CM | POA: Diagnosis not present

## 2019-09-27 DIAGNOSIS — E785 Hyperlipidemia, unspecified: Secondary | ICD-10-CM | POA: Diagnosis not present

## 2019-09-27 DIAGNOSIS — Z87440 Personal history of urinary (tract) infections: Secondary | ICD-10-CM | POA: Diagnosis not present

## 2019-09-27 DIAGNOSIS — K219 Gastro-esophageal reflux disease without esophagitis: Secondary | ICD-10-CM | POA: Diagnosis not present

## 2019-09-27 DIAGNOSIS — Z7901 Long term (current) use of anticoagulants: Secondary | ICD-10-CM | POA: Diagnosis not present

## 2019-09-27 DIAGNOSIS — E210 Primary hyperparathyroidism: Secondary | ICD-10-CM | POA: Diagnosis not present

## 2019-09-27 DIAGNOSIS — R260 Ataxic gait: Secondary | ICD-10-CM | POA: Diagnosis not present

## 2019-09-27 DIAGNOSIS — R4182 Altered mental status, unspecified: Secondary | ICD-10-CM | POA: Diagnosis not present

## 2019-09-27 DIAGNOSIS — F419 Anxiety disorder, unspecified: Secondary | ICD-10-CM | POA: Diagnosis not present

## 2019-09-27 DIAGNOSIS — R292 Abnormal reflex: Secondary | ICD-10-CM | POA: Diagnosis not present

## 2019-09-29 MED FILL — SIMVASTATIN 20 MG TABLET: 20 | 90 days supply | Qty: 90 | Fill #2

## 2019-09-29 MED FILL — ARIPiprazole 5 MG TABS: 5 | 90 days supply | Qty: 90 | Fill #0

## 2019-09-30 ENCOUNTER — Ambulatory Visit (INDEPENDENT_AMBULATORY_CARE_PROVIDER_SITE_OTHER): Payer: PPO | Admitting: Psychology

## 2019-09-30 ENCOUNTER — Other Ambulatory Visit: Payer: Self-pay

## 2019-09-30 DIAGNOSIS — G4733 Obstructive sleep apnea (adult) (pediatric): Secondary | ICD-10-CM

## 2019-09-30 DIAGNOSIS — F0390 Unspecified dementia without behavioral disturbance: Secondary | ICD-10-CM

## 2019-09-30 DIAGNOSIS — F0150 Vascular dementia without behavioral disturbance: Secondary | ICD-10-CM

## 2019-09-30 DIAGNOSIS — S065X9A Traumatic subdural hemorrhage with loss of consciousness of unspecified duration, initial encounter: Secondary | ICD-10-CM

## 2019-09-30 DIAGNOSIS — S065XAA Traumatic subdural hemorrhage with loss of consciousness status unknown, initial encounter: Secondary | ICD-10-CM

## 2019-09-30 NOTE — Patient Instructions (Signed)
Additional testing in the form of a lumbar puncture or DAT scan could be beneficial in providing additional diagnostic information should further diagnostic clarity be desired. The latter may be more palatable given that it is less invasive. [name] should discuss these options with Dr. Delice Lesch.   The status surrounding [name]'s history of obstructive sleep apnea was difficult to understand as he reported having the diagnosis, being told that it was too mild to warrant a CPAP machine, and then that he never completed a formal sleep study. If a sleep study has not been completed, then I would recommend that he be referred for one. If prior testing does exist and was said to warrant CPAP intervention, then I would encourage him to adhere to these recommendations. Present and untreated sleep apnea would increase his risk for stroke, heart attack, and further cognitive decline.   Should there be a progression of his current deficits over time, [name] is unlikely to regain any independent living skills lost. Therefore, it is recommended that he remain as involved as possible in all aspects of household chores, finances, and medication management, with supervision to ensure adequate performance. He will likely benefit from the establishment and maintenance of a routine in order to maximize his functional abilities over time.  It will be important for [name] to have another person with him when in situations where he may need to process information, weigh the pros and cons of different options, and make decisions, in order to ensure that he fully understands and recalls all information to be considered.  If not already done, [name] and his family may want to discuss his wishes regarding durable power of attorney and medical decision making, so that he can have input into these choices. Additionally, they may wish to discuss future plans for caretaking and seek out community options for in  home/residential care should they become necessary.  [name] is encouraged to attend to lifestyle factors for brain health (e.g., regular physical exercise, good nutrition habits, regular participation in cognitively-stimulating activities, and general stress management techniques), which are likely to have benefits for both emotional adjustment and cognition. In fact, in addition to promoting good general health, regular exercise incorporating aerobic activities (e.g., brisk walking, jogging, cycling, etc.) has been demonstrated to be a very effective treatment for depression and stress, with similar efficacy rates to both antidepressant medication and psychotherapy. Optimal control of vascular risk factors (including safe cardiovascular exercise and adherence to dietary recommendations) is encouraged.   When learning new information, he would benefit from information being broken up into small, manageable pieces. He may also find it helpful to articulate the material in his own words and in a context to promote encoding at the onset of a new task. This material may need to be repeated multiple times to promote encoding.  Memory can be improved using internal strategies such as rehearsal, repetition, chunking, mnemonics, association, and imagery. External strategies such as written notes in a consistently used memory journal, visual and nonverbal auditory cues such as a calendar on the refrigerator or appointments with alarm, such as on a cell phone, can also help maximize recall.    To address problems with processing speed, he may wish to consider:   -Ensuring that he is alerted when essential material or instructions are being presented   -Adjusting the speed at which new information is presented   -Allowing for more time in comprehending, processing, and responding in conversation  To address  problems with fluctuating attention, he may wish to consider:   -Avoiding external distractions  when needing to concentrate   -Limiting exposure to fast paced environments with multiple sensory demands   -Writing down complicated information and using checklists   -Attempting and completing one task at a time (i.e., no multi-tasking)   -Verbalizing aloud each step of a task to maintain focus   -Taking frequent breaks during the completion of steps/tasks to avoid fatigue   -Reducing the amount of information considered at one time
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
## 2019-10-04 DIAGNOSIS — H903 Sensorineural hearing loss, bilateral: Secondary | ICD-10-CM | POA: Diagnosis not present

## 2019-10-11 DIAGNOSIS — K219 Gastro-esophageal reflux disease without esophagitis: Secondary | ICD-10-CM | POA: Diagnosis not present

## 2019-10-11 DIAGNOSIS — Z87440 Personal history of urinary (tract) infections: Secondary | ICD-10-CM | POA: Diagnosis not present

## 2019-10-11 DIAGNOSIS — N200 Calculus of kidney: Secondary | ICD-10-CM | POA: Diagnosis not present

## 2019-10-11 DIAGNOSIS — Z9181 History of falling: Secondary | ICD-10-CM | POA: Diagnosis not present

## 2019-10-11 DIAGNOSIS — R260 Ataxic gait: Secondary | ICD-10-CM | POA: Diagnosis not present

## 2019-10-11 DIAGNOSIS — R4182 Altered mental status, unspecified: Secondary | ICD-10-CM | POA: Diagnosis not present

## 2019-10-11 DIAGNOSIS — F419 Anxiety disorder, unspecified: Secondary | ICD-10-CM | POA: Diagnosis not present

## 2019-10-11 DIAGNOSIS — E210 Primary hyperparathyroidism: Secondary | ICD-10-CM | POA: Diagnosis not present

## 2019-10-11 DIAGNOSIS — E785 Hyperlipidemia, unspecified: Secondary | ICD-10-CM | POA: Diagnosis not present

## 2019-10-11 DIAGNOSIS — R292 Abnormal reflex: Secondary | ICD-10-CM | POA: Diagnosis not present

## 2019-10-11 DIAGNOSIS — S065X0S Traumatic subdural hemorrhage without loss of consciousness, sequela: Secondary | ICD-10-CM | POA: Diagnosis not present

## 2019-10-11 DIAGNOSIS — G4730 Sleep apnea, unspecified: Secondary | ICD-10-CM | POA: Diagnosis not present

## 2019-10-11 DIAGNOSIS — Z7901 Long term (current) use of anticoagulants: Secondary | ICD-10-CM | POA: Diagnosis not present

## 2019-10-11 DIAGNOSIS — Z86711 Personal history of pulmonary embolism: Secondary | ICD-10-CM | POA: Diagnosis not present

## 2019-10-11 DIAGNOSIS — F329 Major depressive disorder, single episode, unspecified: Secondary | ICD-10-CM | POA: Diagnosis not present

## 2019-10-11 DIAGNOSIS — H532 Diplopia: Secondary | ICD-10-CM | POA: Diagnosis not present

## 2019-10-11 DIAGNOSIS — Z87891 Personal history of nicotine dependence: Secondary | ICD-10-CM | POA: Diagnosis not present

## 2019-10-11 MED FILL — SERTRALINE HCL 100 MG TAB: 100 | 90 days supply | Qty: 180 | Fill #2

## 2019-10-11 MED FILL — LEVOTHYROXINE 50 MCG TABLET: 50 | 90 days supply | Qty: 90 | Fill #0

## 2019-10-13 DIAGNOSIS — Z23 Encounter for immunization: Secondary | ICD-10-CM | POA: Diagnosis not present

## 2019-10-14 ENCOUNTER — Other Ambulatory Visit (HOSPITAL_COMMUNITY): Payer: Self-pay | Admitting: Internal Medicine

## 2019-10-14 MED FILL — PANTOPRAZOLE SOD DR 40 MG T: 40 | 90 days supply | Qty: 90 | Fill #0

## 2019-10-19 DIAGNOSIS — S065X0S Traumatic subdural hemorrhage without loss of consciousness, sequela: Secondary | ICD-10-CM | POA: Diagnosis not present

## 2019-10-19 DIAGNOSIS — N200 Calculus of kidney: Secondary | ICD-10-CM | POA: Diagnosis not present

## 2019-10-19 DIAGNOSIS — Z87440 Personal history of urinary (tract) infections: Secondary | ICD-10-CM | POA: Diagnosis not present

## 2019-10-19 DIAGNOSIS — Z7901 Long term (current) use of anticoagulants: Secondary | ICD-10-CM | POA: Diagnosis not present

## 2019-10-19 DIAGNOSIS — R4182 Altered mental status, unspecified: Secondary | ICD-10-CM | POA: Diagnosis not present

## 2019-10-19 DIAGNOSIS — F329 Major depressive disorder, single episode, unspecified: Secondary | ICD-10-CM | POA: Diagnosis not present

## 2019-10-19 DIAGNOSIS — Z87891 Personal history of nicotine dependence: Secondary | ICD-10-CM | POA: Diagnosis not present

## 2019-10-19 DIAGNOSIS — R292 Abnormal reflex: Secondary | ICD-10-CM | POA: Diagnosis not present

## 2019-10-19 DIAGNOSIS — Z9181 History of falling: Secondary | ICD-10-CM | POA: Diagnosis not present

## 2019-10-19 DIAGNOSIS — H532 Diplopia: Secondary | ICD-10-CM | POA: Diagnosis not present

## 2019-10-19 DIAGNOSIS — G4730 Sleep apnea, unspecified: Secondary | ICD-10-CM | POA: Diagnosis not present

## 2019-10-19 DIAGNOSIS — Z86711 Personal history of pulmonary embolism: Secondary | ICD-10-CM | POA: Diagnosis not present

## 2019-10-19 DIAGNOSIS — K219 Gastro-esophageal reflux disease without esophagitis: Secondary | ICD-10-CM | POA: Diagnosis not present

## 2019-10-19 DIAGNOSIS — F419 Anxiety disorder, unspecified: Secondary | ICD-10-CM | POA: Diagnosis not present

## 2019-10-19 DIAGNOSIS — E210 Primary hyperparathyroidism: Secondary | ICD-10-CM | POA: Diagnosis not present

## 2019-10-19 DIAGNOSIS — R260 Ataxic gait: Secondary | ICD-10-CM | POA: Diagnosis not present

## 2019-10-19 DIAGNOSIS — E785 Hyperlipidemia, unspecified: Secondary | ICD-10-CM | POA: Diagnosis not present

## 2019-10-20 ENCOUNTER — Other Ambulatory Visit (HOSPITAL_COMMUNITY): Payer: Self-pay

## 2019-10-20 ENCOUNTER — Telehealth: Payer: Self-pay | Admitting: Neurology

## 2019-10-20 NOTE — Telephone Encounter (Signed)
Tyler Mason PT requesting extension on PT order for 1x/week starting on 10/24/19.

## 2019-10-20 NOTE — Telephone Encounter (Signed)
Verbal orders given to Ila Mcgill PT  extension on PT order for 1x/week starting on 10/24/19

## 2019-10-21 DIAGNOSIS — F330 Major depressive disorder, recurrent, mild: Secondary | ICD-10-CM | POA: Diagnosis not present

## 2019-10-26 MED FILL — PREVIDENT 5000 BOOSTER PLUS: 1.1 | 30 days supply | Qty: 100 | Fill #0

## 2019-11-03 DIAGNOSIS — F0632 Mood disorder due to known physiological condition with major depressive-like episode: Secondary | ICD-10-CM | POA: Diagnosis not present

## 2019-11-07 DIAGNOSIS — L03116 Cellulitis of left lower limb: Secondary | ICD-10-CM | POA: Diagnosis not present

## 2019-11-08 ENCOUNTER — Other Ambulatory Visit: Payer: Self-pay | Admitting: Internal Medicine

## 2019-11-08 ENCOUNTER — Ambulatory Visit
Admission: RE | Admit: 2019-11-08 | Discharge: 2019-11-08 | Disposition: A | Discharge status: Home / Self Care | Payer: PPO | Source: Ambulatory Visit | Attending: Internal Medicine | Admitting: Internal Medicine

## 2019-11-08 DIAGNOSIS — S8992XA Unspecified injury of left lower leg, initial encounter: Secondary | ICD-10-CM | POA: Diagnosis not present

## 2019-11-08 DIAGNOSIS — M79606 Pain in leg, unspecified: Secondary | ICD-10-CM | POA: Diagnosis not present

## 2019-11-08 DIAGNOSIS — Z7901 Long term (current) use of anticoagulants: Secondary | ICD-10-CM | POA: Diagnosis not present

## 2019-11-08 DIAGNOSIS — M7989 Other specified soft tissue disorders: Secondary | ICD-10-CM | POA: Diagnosis not present

## 2019-11-08 DIAGNOSIS — L0390 Cellulitis, unspecified: Secondary | ICD-10-CM | POA: Diagnosis not present

## 2019-11-08 DIAGNOSIS — R791 Abnormal coagulation profile: Secondary | ICD-10-CM | POA: Diagnosis not present

## 2019-11-08 DIAGNOSIS — S99912A Unspecified injury of left ankle, initial encounter: Secondary | ICD-10-CM | POA: Diagnosis not present

## 2019-11-08 DIAGNOSIS — M79605 Pain in left leg: Secondary | ICD-10-CM

## 2019-11-10 DIAGNOSIS — R791 Abnormal coagulation profile: Secondary | ICD-10-CM | POA: Diagnosis not present

## 2019-11-15 ENCOUNTER — Other Ambulatory Visit (HOSPITAL_COMMUNITY): Payer: Self-pay

## 2019-11-15 DIAGNOSIS — Z148 Genetic carrier of other disease: Secondary | ICD-10-CM | POA: Diagnosis not present

## 2019-11-15 DIAGNOSIS — M7989 Other specified soft tissue disorders: Secondary | ICD-10-CM | POA: Diagnosis not present

## 2019-11-15 DIAGNOSIS — E782 Mixed hyperlipidemia: Secondary | ICD-10-CM | POA: Diagnosis not present

## 2019-11-15 DIAGNOSIS — E039 Hypothyroidism, unspecified: Secondary | ICD-10-CM | POA: Diagnosis not present

## 2019-11-15 DIAGNOSIS — E538 Deficiency of other specified B group vitamins: Secondary | ICD-10-CM | POA: Diagnosis not present

## 2019-11-15 DIAGNOSIS — K219 Gastro-esophageal reflux disease without esophagitis: Secondary | ICD-10-CM | POA: Diagnosis not present

## 2019-11-15 DIAGNOSIS — I82401 Acute embolism and thrombosis of unspecified deep veins of right lower extremity: Secondary | ICD-10-CM | POA: Diagnosis not present

## 2019-11-15 DIAGNOSIS — Z0000 Encounter for general adult medical examination without abnormal findings: Secondary | ICD-10-CM | POA: Diagnosis not present

## 2019-11-15 DIAGNOSIS — F331 Major depressive disorder, recurrent, moderate: Secondary | ICD-10-CM | POA: Diagnosis not present

## 2019-11-15 DIAGNOSIS — N182 Chronic kidney disease, stage 2 (mild): Secondary | ICD-10-CM | POA: Diagnosis not present

## 2019-11-15 DIAGNOSIS — Z125 Encounter for screening for malignant neoplasm of prostate: Secondary | ICD-10-CM | POA: Diagnosis not present

## 2019-11-15 DIAGNOSIS — I2699 Other pulmonary embolism without acute cor pulmonale: Secondary | ICD-10-CM | POA: Diagnosis not present

## 2019-11-15 DIAGNOSIS — E213 Hyperparathyroidism, unspecified: Secondary | ICD-10-CM | POA: Diagnosis not present

## 2019-11-15 MED FILL — CHLORHEXIDINE 0.12% RINSE: 0.12 | 16 days supply | Qty: 473 | Fill #0

## 2019-11-22 DIAGNOSIS — Z7901 Long term (current) use of anticoagulants: Secondary | ICD-10-CM | POA: Diagnosis not present

## 2019-11-29 ENCOUNTER — Other Ambulatory Visit (HOSPITAL_COMMUNITY): Payer: Self-pay | Admitting: Oral Surgery

## 2019-12-06 MED FILL — CLINDAMYCIN HCL 150 MG CAPS: 150 | 8 days supply | Qty: 30 | Fill #0

## 2019-12-09 MED FILL — WARFARIN SODIUM 5 MG TABLET: 5 | 84 days supply | Qty: 96 | Fill #1

## 2019-12-26 DIAGNOSIS — F330 Major depressive disorder, recurrent, mild: Secondary | ICD-10-CM | POA: Diagnosis not present

## 2019-12-26 MED FILL — SIMVASTATIN 20 MG TABLET: 20 | 90 days supply | Qty: 90 | Fill #3

## 2019-12-26 MED FILL — ARIPiprazole 5 MG TABS: 5 | 90 days supply | Qty: 90 | Fill #1

## 2020-01-04 ENCOUNTER — Other Ambulatory Visit: Payer: Self-pay

## 2020-01-04 ENCOUNTER — Encounter: Payer: Self-pay | Admitting: Neurology

## 2020-01-04 ENCOUNTER — Other Ambulatory Visit: Payer: Self-pay | Admitting: Neurology

## 2020-01-04 ENCOUNTER — Ambulatory Visit: Payer: PPO | Admitting: Neurology

## 2020-01-04 VITALS — BP 134/79 | HR 74 | Resp 20 | Ht 70.0 in | Wt 202.0 lb

## 2020-01-04 DIAGNOSIS — R251 Tremor, unspecified: Secondary | ICD-10-CM

## 2020-01-04 DIAGNOSIS — F0390 Unspecified dementia without behavioral disturbance: Secondary | ICD-10-CM

## 2020-01-04 MED ORDER — DONEPEZIL HCL 10 MG PO TABS: ORAL_TABLET | ORAL | 11 refills | Status: DC

## 2020-01-04 MED FILL — LEVOTHYROXINE 50 MCG TABLET: 50 | 90 days supply | Qty: 90 | Fill #1

## 2020-01-04 MED FILL — DONEPEZIL HCL 10 MG TABLET: 10 | 30 days supply | Qty: 30 | Fill #0

## 2020-01-04 MED FILL — SERTRALINE HCL 100 MG TAB: 100 | 90 days supply | Qty: 180 | Fill #3

## 2020-01-04 NOTE — Patient Instructions (Signed)
1. Schedule DATscan  2. Start Donepezil 10mg : Take 1/2 tablet daily for 2 weeks, then increase to 1 tablet daily  3. Continue doing home PT exercises regularly  4. Monitor driving  5. Follow-up in 6 months, call for any changes   FALL PRECAUTIONS: Be cautious when walking. Scan the area for obstacles that may increase the risk of trips and falls. When getting up in the mornings, sit up at the edge of the bed for a few minutes before getting out of bed. Consider elevating the bed at the head end to avoid drop of blood pressure when getting up. Walk always in a well-lit room (use night lights in the walls). Avoid area rugs or power cords from appliances in the middle of the walkways. Use a walker or a cane if necessary and consider physical therapy for balance exercise. Get your eyesight checked regularly.  FINANCIAL OVERSIGHT: Supervision, especially oversight when making financial decisions or transactions is also recommended.  HOME SAFETY: Consider the safety of the kitchen when operating appliances like stoves, microwave oven, and blender. Consider having supervision and share cooking responsibilities until no longer able to participate in those. Accidents with firearms and other hazards in the house should be identified and addressed as well.  DRIVING: Regarding driving, in patients with progressive memory problems, driving will be impaired. We advise to have someone else do the driving if trouble finding directions or if minor accidents are reported. Independent driving assessment is available to determine safety of driving.  ABILITY TO BE LEFT ALONE: If patient is unable to contact 911 operator, consider using LifeLine, or when the need is there, arrange for someone to stay with patients. Smoking is a fire hazard, consider supervision or cessation. Risk of wandering should be assessed by caregiver and if detected at any point, supervision and safe proof recommendations should be  instituted.  MEDICATION SUPERVISION: Inability to self-administer medication needs to be constantly addressed. Implement a mechanism to ensure safe administration of the medications.  RECOMMENDATIONS FOR ALL PATIENTS WITH MEMORY PROBLEMS: 1. Continue to exercise (Recommend 30 minutes of walking everyday, or 3 hours every week) 2. Increase social interactions - continue going to [place] and enjoy social gatherings with friends and family 3. Eat healthy, avoid fried foods and eat more fruits and vegetables 4. Maintain adequate blood pressure, blood sugar, and blood cholesterol level. Reducing the risk of stroke and cardiovascular disease also helps promoting better memory. 5. Avoid stressful situations. Live a simple life and avoid aggravations. Organize your time and prepare for the next day in anticipation. 6. Sleep well, avoid any interruptions of sleep and avoid any distractions in the bedroom that may interfere with adequate sleep quality 7. Avoid sugar, avoid sweets as there is a strong link between excessive sugar intake, diabetes, and cognitive impairment The Mediterranean diet has been shown to help patients reduce the risk of progressive memory disorders and reduces cardiovascular risk. This includes eating fish, eat fruits and green leafy vegetables, nuts like almonds and hazelnuts, walnuts, and also use olive oil. Avoid fast foods and fried foods as much as possible. Avoid sweets and sugar as sugar use has been linked to worsening of memory function.  There is always a concern of gradual progression of memory problems. If this is the case, then we may need to adjust level of care according to patient needs. Support, both to the patient and caregiver, should then be put into place.
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
## 2020-01-09 MED FILL — PANTOPRAZOLE SOD DR 40 MG T: 40 | 90 days supply | Qty: 90 | Fill #1

## 2020-01-20 DIAGNOSIS — Z7901 Long term (current) use of anticoagulants: Secondary | ICD-10-CM | POA: Diagnosis not present

## 2020-01-20 MED FILL — CHLORHEXIDINE 0.12% RINSE: 0.12 | 16 days supply | Qty: 473 | Fill #1

## 2020-01-24 ENCOUNTER — Encounter (HOSPITAL_COMMUNITY)
Admission: RE | Admit: 2020-01-24 | Discharge: 2020-01-24 | Disposition: A | Discharge status: Home / Self Care | Payer: PPO | Source: Ambulatory Visit | Attending: Neurology | Admitting: Neurology

## 2020-01-24 ENCOUNTER — Other Ambulatory Visit: Payer: Self-pay

## 2020-01-24 DIAGNOSIS — F0390 Unspecified dementia without behavioral disturbance: Secondary | ICD-10-CM | POA: Diagnosis not present

## 2020-01-24 DIAGNOSIS — R251 Tremor, unspecified: Secondary | ICD-10-CM | POA: Diagnosis not present

## 2020-01-24 DIAGNOSIS — G20 Parkinson's disease: Secondary | ICD-10-CM | POA: Diagnosis not present

## 2020-01-24 DIAGNOSIS — R413 Other amnesia: Secondary | ICD-10-CM | POA: Diagnosis not present

## 2020-01-24 MED ORDER — IOFLUPANE I 123 185 MBQ/2.5ML IV SOLN
4.9800 | Freq: Once | INTRAVENOUS | Status: AC | PRN
  Administered 2020-01-24: 4.98 via INTRAVENOUS
  Filled 2020-01-24: qty 5

## 2020-01-24 MED ORDER — POTASSIUM IODIDE (ANTIDOTE) 130 MG PO TABS
ORAL_TABLET | ORAL | Status: AC
  Filled 2020-01-24: qty 1

## 2020-01-24 MED ORDER — POTASSIUM IODIDE (ANTIDOTE) 130 MG PO TABS: 130.0000 mg | ORAL_TABLET | Freq: Once | ORAL | Status: DC

## 2020-01-31 ENCOUNTER — Other Ambulatory Visit (HOSPITAL_COMMUNITY): Payer: Self-pay | Admitting: Psychiatry

## 2020-01-31 DIAGNOSIS — F0632 Mood disorder due to known physiological condition with major depressive-like episode: Secondary | ICD-10-CM | POA: Diagnosis not present

## 2020-02-04 MED FILL — DONEPEZIL HCL 10 MG TABLET: 10 | 30 days supply | Qty: 30 | Fill #1

## 2020-02-21 DIAGNOSIS — Z7901 Long term (current) use of anticoagulants: Secondary | ICD-10-CM | POA: Diagnosis not present

## 2020-02-27 DIAGNOSIS — F330 Major depressive disorder, recurrent, mild: Secondary | ICD-10-CM | POA: Diagnosis not present

## 2020-03-02 MED FILL — WARFARIN SODIUM 5 MG TABLET: 5 | 84 days supply | Qty: 96 | Fill #2

## 2020-03-06 DIAGNOSIS — Z7901 Long term (current) use of anticoagulants: Secondary | ICD-10-CM | POA: Diagnosis not present

## 2020-03-12 MED FILL — DONEPEZIL HCL 10 MG TABLET: 10 | 30 days supply | Qty: 30 | Fill #2

## 2020-03-27 ENCOUNTER — Other Ambulatory Visit (HOSPITAL_COMMUNITY): Payer: Self-pay | Admitting: Internal Medicine

## 2020-03-27 MED FILL — ARIPiprazole 5 MG TABS: 5 | 90 days supply | Qty: 90 | Fill #2

## 2020-03-27 MED FILL — SIMVASTATIN 20 MG TABLET: 20 | 90 days supply | Qty: 90 | Fill #0

## 2020-04-03 DIAGNOSIS — Z7901 Long term (current) use of anticoagulants: Secondary | ICD-10-CM | POA: Diagnosis not present

## 2020-04-03 MED FILL — LEVOTHYROXINE 50 MCG TABLET: 50 | 90 days supply | Qty: 90 | Fill #2

## 2020-04-03 MED FILL — PANTOPRAZOLE SOD DR 40 MG T: 40 | 90 days supply | Qty: 90 | Fill #2

## 2020-04-09 DIAGNOSIS — F330 Major depressive disorder, recurrent, mild: Secondary | ICD-10-CM | POA: Diagnosis not present

## 2020-04-14 ENCOUNTER — Other Ambulatory Visit (HOSPITAL_COMMUNITY): Payer: Self-pay | Admitting: Psychiatry

## 2020-04-14 MED FILL — Donepezil Hydrochloride Tab 10 MG: ORAL | 90 days supply | Qty: 90 | Fill #0 | Status: AC

## 2020-04-16 ENCOUNTER — Other Ambulatory Visit (HOSPITAL_COMMUNITY): Payer: Self-pay

## 2020-04-18 ENCOUNTER — Other Ambulatory Visit (HOSPITAL_COMMUNITY): Payer: Self-pay

## 2020-04-18 MED FILL — Chlorhexidine Gluconate Soln 0.12%: OROMUCOSAL | 16 days supply | Qty: 473 | Fill #0 | Status: AC

## 2020-04-18 MED FILL — Sertraline HCl Tab 100 MG: ORAL | 90 days supply | Qty: 180 | Fill #0 | Status: AC

## 2020-04-19 ENCOUNTER — Other Ambulatory Visit (HOSPITAL_COMMUNITY): Payer: Self-pay

## 2020-04-24 DIAGNOSIS — Z7901 Long term (current) use of anticoagulants: Secondary | ICD-10-CM | POA: Diagnosis not present

## 2020-05-14 ENCOUNTER — Telehealth: Payer: Self-pay | Admitting: Neurology

## 2020-05-14 NOTE — Telephone Encounter (Signed)
New message    Pt c/o medication issue:  1. Name of Medication: donepezil (ARICEPT) 10 MG tablet  2. How are you currently taking this medication (dosage and times per day)? One time a day at night   3. Are you having a reaction (difficulty breathing--STAT)? No   4. What is your medication issue? Sleepy a lot more

## 2020-05-15 NOTE — Telephone Encounter (Signed)
He has been taking the Donepezil since end of December, is that right? Is the sleepiness new or this has been going on for the past 4-5 months? Is he sleeping okay at night?

## 2020-05-16 NOTE — Telephone Encounter (Signed)
They can try lower dose by cutting the 10mg  tablet in half and see if that helps.

## 2020-05-16 NOTE — Telephone Encounter (Signed)
Pt has been taken Donepezil since the end of December family stated the sleepiness has progressively gotten worse, his sleep is off at night he is up and down at times, but this is a big change now per family they thought they needed to call because they are worried. Pt is taken his medication at night

## 2020-05-16 NOTE — Telephone Encounter (Signed)
Spoke with pt family they will try  lower dose by cutting the 10mg  tablet in half and see if that helps. they will call in a week with an update

## 2020-05-22 DIAGNOSIS — I2699 Other pulmonary embolism without acute cor pulmonale: Secondary | ICD-10-CM | POA: Diagnosis not present

## 2020-05-22 DIAGNOSIS — I82401 Acute embolism and thrombosis of unspecified deep veins of right lower extremity: Secondary | ICD-10-CM | POA: Diagnosis not present

## 2020-05-22 DIAGNOSIS — R195 Other fecal abnormalities: Secondary | ICD-10-CM | POA: Diagnosis not present

## 2020-05-22 DIAGNOSIS — K219 Gastro-esophageal reflux disease without esophagitis: Secondary | ICD-10-CM | POA: Diagnosis not present

## 2020-05-22 DIAGNOSIS — N400 Enlarged prostate without lower urinary tract symptoms: Secondary | ICD-10-CM | POA: Diagnosis not present

## 2020-05-22 DIAGNOSIS — E039 Hypothyroidism, unspecified: Secondary | ICD-10-CM | POA: Diagnosis not present

## 2020-05-22 DIAGNOSIS — E538 Deficiency of other specified B group vitamins: Secondary | ICD-10-CM | POA: Diagnosis not present

## 2020-05-22 DIAGNOSIS — F0390 Unspecified dementia without behavioral disturbance: Secondary | ICD-10-CM | POA: Diagnosis not present

## 2020-05-22 DIAGNOSIS — Z7901 Long term (current) use of anticoagulants: Secondary | ICD-10-CM | POA: Diagnosis not present

## 2020-05-22 DIAGNOSIS — R269 Unspecified abnormalities of gait and mobility: Secondary | ICD-10-CM | POA: Diagnosis not present

## 2020-05-22 DIAGNOSIS — F39 Unspecified mood [affective] disorder: Secondary | ICD-10-CM | POA: Diagnosis not present

## 2020-05-22 DIAGNOSIS — F322 Major depressive disorder, single episode, severe without psychotic features: Secondary | ICD-10-CM | POA: Diagnosis not present

## 2020-05-23 ENCOUNTER — Other Ambulatory Visit (HOSPITAL_COMMUNITY): Payer: Self-pay

## 2020-05-23 MED ORDER — LEVOTHYROXINE SODIUM 75 MCG PO TABS
75.0000 ug | ORAL_TABLET | Freq: Every morning | ORAL | 3 refills | Status: DC
  Filled 2020-05-23: qty 90, 90d supply, fill #0
  Filled 2020-09-03: qty 90, 90d supply, fill #1
  Filled 2020-12-01: qty 90, 90d supply, fill #0
  Filled 2021-02-25: qty 90, 90d supply, fill #1

## 2020-05-24 ENCOUNTER — Other Ambulatory Visit (HOSPITAL_COMMUNITY): Payer: Self-pay

## 2020-05-26 ENCOUNTER — Other Ambulatory Visit: Payer: Self-pay | Admitting: Internal Medicine

## 2020-05-29 DIAGNOSIS — F330 Major depressive disorder, recurrent, mild: Secondary | ICD-10-CM | POA: Diagnosis not present

## 2020-05-30 ENCOUNTER — Other Ambulatory Visit: Payer: Self-pay | Admitting: Internal Medicine

## 2020-05-30 ENCOUNTER — Other Ambulatory Visit (HOSPITAL_COMMUNITY): Payer: Self-pay

## 2020-05-31 ENCOUNTER — Other Ambulatory Visit (HOSPITAL_COMMUNITY): Payer: Self-pay

## 2020-05-31 MED FILL — Warfarin Sodium Tab 5 MG: ORAL | 28 days supply | Qty: 32 | Fill #0 | Status: AC

## 2020-06-01 ENCOUNTER — Other Ambulatory Visit (HOSPITAL_COMMUNITY): Payer: Self-pay

## 2020-06-01 MED ORDER — WARFARIN SODIUM 5 MG PO TABS
ORAL_TABLET | ORAL | 3 refills | Status: DC
Start: 2020-06-01 — End: 2020-08-31
  Filled 2020-06-07: qty 96, 84d supply, fill #0

## 2020-06-07 ENCOUNTER — Other Ambulatory Visit (HOSPITAL_COMMUNITY): Payer: Self-pay

## 2020-06-08 ENCOUNTER — Other Ambulatory Visit (HOSPITAL_COMMUNITY): Payer: Self-pay

## 2020-06-08 DIAGNOSIS — F330 Major depressive disorder, recurrent, mild: Secondary | ICD-10-CM | POA: Diagnosis not present

## 2020-06-08 MED ORDER — BUPROPION HCL ER (XL) 150 MG PO TB24
150.0000 mg | ORAL_TABLET | Freq: Every morning | ORAL | 5 refills | Status: DC
Start: 2020-06-08 — End: 2020-12-18
  Filled 2020-06-08: qty 30, 30d supply, fill #0
  Filled 2020-07-10: qty 30, 30d supply, fill #1
  Filled 2020-08-13: qty 30, 30d supply, fill #2
  Filled 2020-11-02: qty 30, 30d supply, fill #0

## 2020-06-08 MED ORDER — ARIPIPRAZOLE 5 MG PO TABS
5.0000 mg | ORAL_TABLET | Freq: Every morning | ORAL | 4 refills | Status: DC
Start: 2020-06-08 — End: 2020-08-31
  Filled 2020-06-08: qty 90, 90d supply, fill #0

## 2020-06-15 ENCOUNTER — Ambulatory Visit: Payer: PPO | Admitting: Neurology

## 2020-06-15 ENCOUNTER — Other Ambulatory Visit: Payer: Self-pay

## 2020-06-15 ENCOUNTER — Encounter: Payer: Self-pay | Admitting: Neurology

## 2020-06-15 VITALS — BP 120/72 | HR 63 | Ht 70.0 in | Wt 200.0 lb

## 2020-06-15 DIAGNOSIS — F0390 Unspecified dementia without behavioral disturbance: Secondary | ICD-10-CM

## 2020-06-15 NOTE — Patient Instructions (Addendum)
Stop the Donepezil 5mg  and see how he is doing in a week or so  2. Update me in a week or so, if no change in sleepiness, we will send in order for sleep study. If sleepiness improved, we will plan to start Rivastigmine 1.5mg  twice a day.   3. Continue to monitor driving  4. Continue follow-up with Dr. Casimiro Needle  5. Follow-up in 6 months, call for any changes.    FALL PRECAUTIONS: Be cautious when walking. Scan the area for obstacles that may increase the risk of trips and falls. When getting up in the mornings, sit up at the edge of the bed for a few minutes before getting out of bed. Consider elevating the bed at the head end to avoid drop of blood pressure when getting up. Walk always in a well-lit room (use night lights in the walls). Avoid area rugs or power cords from appliances in the middle of the walkways. Use a walker or a cane if necessary and consider physical therapy for balance exercise. Get your eyesight checked regularly.  HOME SAFETY: Consider the safety of the kitchen when operating appliances like stoves, microwave oven, and blender. Consider having supervision and share cooking responsibilities until no longer able to participate in those. Accidents with firearms and other hazards in the house should be identified and addressed as well.  DRIVING: Regarding driving, in patients with progressive memory problems, driving will be impaired. We advise to have someone else do the driving if trouble finding directions or if minor accidents are reported. Independent driving assessment is available to determine safety of driving.  ABILITY TO BE LEFT ALONE: If patient is unable to contact 911 operator, consider using LifeLine, or when the need is there, arrange for someone to stay with patients. Smoking is a fire hazard, consider supervision or cessation. Risk of wandering should be assessed by caregiver and if detected at any point, supervision and safe proof recommendations should be  instituted.   RECOMMENDATIONS FOR ALL PATIENTS WITH MEMORY PROBLEMS: 1. Continue to exercise (Recommend 30 minutes of walking everyday, or 3 hours every week) 2. Increase social interactions - continue going to [place] and enjoy social gatherings with friends and family 3. Eat healthy, avoid fried foods and eat more fruits and vegetables 4. Maintain adequate blood pressure, blood sugar, and blood cholesterol level. Reducing the risk of stroke and cardiovascular disease also helps promoting better memory. 5. Avoid stressful situations. Live a simple life and avoid aggravations. Organize your time and prepare for the next day in anticipation. 6. Sleep well, avoid any interruptions of sleep and avoid any distractions in the bedroom that may interfere with adequate sleep quality 7. Avoid sugar, avoid sweets as there is a strong link between excessive sugar intake, diabetes, and cognitive impairment The Mediterranean diet has been shown to help patients reduce the risk of progressive memory disorders and reduces cardiovascular risk. This includes eating fish, eat fruits and green leafy vegetables, nuts like almonds and hazelnuts, walnuts, and also use olive oil. Avoid fast foods and fried foods as much as possible. Avoid sweets and sugar as sugar use has been linked to worsening of memory function.  There is always a concern of gradual progression of memory problems. If this is the case, then we may need to adjust level of care according to patient needs. Support, both to the patient and caregiver, should then be put into place.
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
## 2020-06-19 DIAGNOSIS — Z7901 Long term (current) use of anticoagulants: Secondary | ICD-10-CM | POA: Diagnosis not present

## 2020-06-21 ENCOUNTER — Other Ambulatory Visit (HOSPITAL_COMMUNITY): Payer: Self-pay

## 2020-06-22 DIAGNOSIS — F330 Major depressive disorder, recurrent, mild: Secondary | ICD-10-CM | POA: Diagnosis not present

## 2020-06-24 ENCOUNTER — Other Ambulatory Visit: Payer: Self-pay

## 2020-06-24 MED FILL — Simvastatin Tab 20 MG: ORAL | 90 days supply | Qty: 90 | Fill #0 | Status: AC

## 2020-06-25 ENCOUNTER — Other Ambulatory Visit (HOSPITAL_COMMUNITY): Payer: Self-pay

## 2020-06-25 ENCOUNTER — Telehealth: Payer: Self-pay | Admitting: Neurology

## 2020-06-25 ENCOUNTER — Other Ambulatory Visit: Payer: Self-pay

## 2020-06-25 DIAGNOSIS — G4733 Obstructive sleep apnea (adult) (pediatric): Secondary | ICD-10-CM

## 2020-06-25 MED ORDER — WARFARIN SODIUM 5 MG PO TABS
ORAL_TABLET | ORAL | 3 refills | Status: DC
Start: 2020-06-25 — End: 2020-08-31
  Filled 2020-06-25: qty 90, 90d supply, fill #0

## 2020-06-25 NOTE — Telephone Encounter (Signed)
Wife called in to report how birch was doing. Has been off aricept for 1 week. Has been some improvement not a lot. Has not been as sleepy.

## 2020-06-25 NOTE — Telephone Encounter (Signed)
Would recommend doing sleep study. Pls order Home sleep test, dx: daytime drowsiness. Thanks

## 2020-06-27 ENCOUNTER — Telehealth: Payer: Self-pay | Admitting: Neurology

## 2020-06-27 ENCOUNTER — Ambulatory Visit: Payer: PPO | Admitting: Neurology

## 2020-06-27 NOTE — Telephone Encounter (Signed)
Per Zigmund Daniel at [facility] [phone] No Prior Auth is required for CPT 95806 Home Sleep Study - advised Christy E   No Referral in [place]

## 2020-07-01 MED FILL — Chlorhexidine Gluconate Soln 0.12%: OROMUCOSAL | 16 days supply | Qty: 473 | Fill #1 | Status: AC

## 2020-07-02 ENCOUNTER — Other Ambulatory Visit (HOSPITAL_COMMUNITY): Payer: Self-pay

## 2020-07-02 DIAGNOSIS — R3915 Urgency of urination: Secondary | ICD-10-CM | POA: Diagnosis not present

## 2020-07-02 DIAGNOSIS — R351 Nocturia: Secondary | ICD-10-CM | POA: Diagnosis not present

## 2020-07-02 DIAGNOSIS — N200 Calculus of kidney: Secondary | ICD-10-CM | POA: Diagnosis not present

## 2020-07-02 DIAGNOSIS — N390 Urinary tract infection, site not specified: Secondary | ICD-10-CM | POA: Diagnosis not present

## 2020-07-04 ENCOUNTER — Other Ambulatory Visit (HOSPITAL_COMMUNITY): Payer: Self-pay

## 2020-07-04 DIAGNOSIS — E039 Hypothyroidism, unspecified: Secondary | ICD-10-CM | POA: Diagnosis not present

## 2020-07-04 MED ORDER — CEFDINIR 300 MG PO CAPS
300.0000 mg | ORAL_CAPSULE | Freq: Two times a day (BID) | ORAL | 0 refills | Status: DC
  Filled 2020-07-04: qty 28, 14d supply, fill #0

## 2020-07-10 ENCOUNTER — Other Ambulatory Visit: Payer: Self-pay

## 2020-07-11 ENCOUNTER — Other Ambulatory Visit (HOSPITAL_COMMUNITY): Payer: Self-pay

## 2020-07-11 ENCOUNTER — Other Ambulatory Visit: Payer: Self-pay

## 2020-07-11 MED ORDER — PANTOPRAZOLE SODIUM 40 MG PO TBEC
40.0000 mg | DELAYED_RELEASE_TABLET | Freq: Every day | ORAL | 2 refills | Status: DC
Start: 2020-07-11 — End: 2020-08-31
  Filled 2020-07-11: qty 90, 90d supply, fill #0

## 2020-07-12 ENCOUNTER — Other Ambulatory Visit (HOSPITAL_COMMUNITY): Payer: Self-pay

## 2020-07-16 DIAGNOSIS — R300 Dysuria: Secondary | ICD-10-CM | POA: Diagnosis not present

## 2020-07-16 DIAGNOSIS — R351 Nocturia: Secondary | ICD-10-CM | POA: Diagnosis not present

## 2020-07-16 DIAGNOSIS — N200 Calculus of kidney: Secondary | ICD-10-CM | POA: Diagnosis not present

## 2020-07-16 DIAGNOSIS — Z7901 Long term (current) use of anticoagulants: Secondary | ICD-10-CM | POA: Diagnosis not present

## 2020-08-13 ENCOUNTER — Ambulatory Visit (HOSPITAL_BASED_OUTPATIENT_CLINIC_OR_DEPARTMENT_OTHER): Payer: PPO | Attending: Neurology | Admitting: Internal Medicine

## 2020-08-13 ENCOUNTER — Other Ambulatory Visit: Payer: Self-pay

## 2020-08-13 ENCOUNTER — Other Ambulatory Visit (HOSPITAL_COMMUNITY): Payer: Self-pay

## 2020-08-13 DIAGNOSIS — Z7901 Long term (current) use of anticoagulants: Secondary | ICD-10-CM | POA: Diagnosis not present

## 2020-08-13 DIAGNOSIS — G4733 Obstructive sleep apnea (adult) (pediatric): Secondary | ICD-10-CM | POA: Diagnosis not present

## 2020-08-15 DIAGNOSIS — G4733 Obstructive sleep apnea (adult) (pediatric): Secondary | ICD-10-CM | POA: Diagnosis not present

## 2020-08-15 NOTE — Procedures (Signed)
    Patient Name: Tyler Mason, Tyler Mason Date: 08/14/2020 Gender: Male D.O.B: 04/09/1950 Age (years): 70 Referring Provider: [name] Height (inches): 58 Interpreting Physician: Baird Lyons MD, ABSM Weight (lbs): 200 RPSGT: Jacolyn Reedy BMI: 29 MRN: [id number] Neck Size: <br>  CLINICAL INFORMATION Sleep Study Type: HST Indication for sleep study: Somnolence Epworth Sleepiness Score: 12  SLEEP STUDY TECHNIQUE A multi-channel overnight portable sleep study was performed. The channels recorded were: nasal airflow, thoracic respiratory movement, and oxygen saturation with a pulse oximetry. Snoring was also monitored.  MEDICATIONS Patient self administered medications include: none reported.  SLEEP ARCHITECTURE Patient was studied for 256.2 minutes. The sleep efficiency was 100.0 % and the patient was supine for 0%. The arousal index was 0.0 per hour.  RESPIRATORY PARAMETERS The overall AHI was 25.8 per hour, with a central apnea index of 0 per hour. The oxygen nadir was 79% during sleep.  CARDIAC DATA Mean heart rate during sleep was 64.5 bpm.  IMPRESSIONS - Moderate obstructive sleep apnea occurred during this study (AHI = 25.8/h). - Oxygen desaturation was noted during this study (Min O2 = 79%). Mean O2 saturation 92%. - Patient snored.  DIAGNOSIS - Obstructive Sleep Apnea (G47.33)  RECOMMENDATIONS - Suggest CPAP titration sleep study or autopap. Other options would be based on clinical judgment. - Be careful with alcohol, sedatives and other CNS depressants that may worsen sleep apnea and disrupt normal sleep architecture. - Sleep hygiene should be reviewed to assess factors that may improve sleep quality. - Weight management and regular exercise should be initiated or continued.  [Electronically signed] 08/15/2020 02:45 PM  Baird Lyons MD, [place], American Board of Sleep Medicine   NPI: [id number]                          [place], [facility]  ELECTRONICALLY SIGNED ON:  08/15/2020, 2:43 PM [place] PH: [phone]   FX: [phone] [place]

## 2020-08-19 NOTE — Progress Notes (Signed)
Pls let wife know the sleep study showed moderate sleep apnea, pls refer to Pulmonary for OSA, thanks

## 2020-08-24 ENCOUNTER — Telehealth: Payer: Self-pay

## 2020-08-24 DIAGNOSIS — G4733 Obstructive sleep apnea (adult) (pediatric): Secondary | ICD-10-CM

## 2020-08-24 NOTE — Telephone Encounter (Signed)
Spoke with wife informed her  the sleep study showed moderate sleep apnea, pls refer to Pulmonary for OSA, thanks

## 2020-08-27 DIAGNOSIS — R609 Edema, unspecified: Secondary | ICD-10-CM | POA: Diagnosis not present

## 2020-08-27 DIAGNOSIS — R0600 Dyspnea, unspecified: Secondary | ICD-10-CM | POA: Diagnosis not present

## 2020-08-27 DIAGNOSIS — Z86711 Personal history of pulmonary embolism: Secondary | ICD-10-CM | POA: Diagnosis not present

## 2020-08-27 DIAGNOSIS — G4733 Obstructive sleep apnea (adult) (pediatric): Secondary | ICD-10-CM | POA: Diagnosis not present

## 2020-08-31 ENCOUNTER — Other Ambulatory Visit: Payer: Self-pay

## 2020-08-31 ENCOUNTER — Encounter: Payer: Self-pay | Admitting: Pulmonary Disease

## 2020-08-31 ENCOUNTER — Ambulatory Visit: Payer: PPO | Admitting: Pulmonary Disease

## 2020-08-31 VITALS — BP 110/76 | HR 86 | Temp 97.7°F | Ht 70.0 in | Wt 206.4 lb

## 2020-08-31 DIAGNOSIS — G4733 Obstructive sleep apnea (adult) (pediatric): Secondary | ICD-10-CM | POA: Diagnosis not present

## 2020-08-31 DIAGNOSIS — R0600 Dyspnea, unspecified: Secondary | ICD-10-CM | POA: Diagnosis not present

## 2020-08-31 DIAGNOSIS — R609 Edema, unspecified: Secondary | ICD-10-CM | POA: Diagnosis not present

## 2020-08-31 DIAGNOSIS — R600 Localized edema: Secondary | ICD-10-CM | POA: Diagnosis not present
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
## 2020-08-31 NOTE — Patient Instructions (Addendum)
Rx for autoCPAP 5-15cm with nasal mask will be sen to DME Follow p in 30 days after you get the machine

## 2020-08-31 NOTE — Assessment & Plan Note (Signed)
We reviewed home sleep test which showed AHI of 26/hour consistent with moderate OSA and significant desaturations. We reviewed that some of her symptoms could be consistent with untreated OSA.  We discussed treatment options including dental device and CPAP and inspire implant.  He would be agreeable to trial of CPAP. We will start him on auto CPAP 5 to 15 cm with nasal mask and see how he adjusts to this.  We will evaluate him after 1 month of usage for symptomatic improvement in his somnolence and fatigue

## 2020-09-03 ENCOUNTER — Other Ambulatory Visit (HOSPITAL_COMMUNITY): Payer: Self-pay

## 2020-09-06 ENCOUNTER — Other Ambulatory Visit (HOSPITAL_COMMUNITY): Payer: Self-pay

## 2020-09-06 DIAGNOSIS — F330 Major depressive disorder, recurrent, mild: Secondary | ICD-10-CM | POA: Diagnosis not present

## 2020-09-06 MED ORDER — BUPROPION HCL ER (XL) 150 MG PO TB24
300.0000 mg | ORAL_TABLET | Freq: Every morning | ORAL | 5 refills | Status: DC
  Filled 2020-09-06: qty 60, 30d supply, fill #0
  Filled 2020-10-07: qty 60, 30d supply, fill #1
  Filled 2020-11-02: qty 60, 30d supply, fill #0

## 2020-09-06 MED ORDER — ARIPIPRAZOLE 5 MG PO TABS
5.0000 mg | ORAL_TABLET | ORAL | 4 refills | Status: DC
  Filled 2020-09-06: qty 90, 90d supply, fill #0

## 2020-09-07 ENCOUNTER — Other Ambulatory Visit (HOSPITAL_COMMUNITY): Payer: Self-pay

## 2020-09-11 DIAGNOSIS — Z7901 Long term (current) use of anticoagulants: Secondary | ICD-10-CM | POA: Diagnosis not present

## 2020-09-17 ENCOUNTER — Other Ambulatory Visit (HOSPITAL_COMMUNITY): Payer: Self-pay

## 2020-09-17 MED ORDER — WARFARIN SODIUM 5 MG PO TABS
ORAL_TABLET | ORAL | 3 refills | Status: DC
  Filled 2020-09-17 – 2020-12-07 (×2): qty 96, 84d supply, fill #0
  Filled 2021-03-03: qty 96, 84d supply, fill #1
  Filled 2021-05-26: qty 96, 84d supply, fill #2

## 2020-09-23 MED FILL — Simvastatin Tab 20 MG: ORAL | 90 days supply | Qty: 90 | Fill #1 | Status: AC

## 2020-09-24 ENCOUNTER — Other Ambulatory Visit (HOSPITAL_COMMUNITY): Payer: Self-pay

## 2020-10-08 ENCOUNTER — Other Ambulatory Visit (HOSPITAL_COMMUNITY): Payer: Self-pay

## 2020-10-15 ENCOUNTER — Other Ambulatory Visit (HOSPITAL_COMMUNITY): Payer: Self-pay

## 2020-10-15 MED ORDER — PANTOPRAZOLE SODIUM 40 MG PO TBEC
40.0000 mg | DELAYED_RELEASE_TABLET | Freq: Every day | ORAL | 2 refills | Status: DC
  Filled 2020-10-15 – 2021-01-07 (×2): qty 90, 90d supply, fill #0
  Filled 2021-04-08: qty 90, 90d supply, fill #1

## 2020-10-16 ENCOUNTER — Other Ambulatory Visit (HOSPITAL_COMMUNITY): Payer: Self-pay

## 2020-10-25 DIAGNOSIS — Z7901 Long term (current) use of anticoagulants: Secondary | ICD-10-CM | POA: Diagnosis not present

## 2020-10-29 DIAGNOSIS — R350 Frequency of micturition: Secondary | ICD-10-CM | POA: Diagnosis not present

## 2020-10-30 ENCOUNTER — Other Ambulatory Visit (HOSPITAL_COMMUNITY): Payer: Self-pay

## 2020-10-30 MED ORDER — CIPROFLOXACIN HCL 500 MG PO TABS
500.0000 mg | ORAL_TABLET | Freq: Two times a day (BID) | ORAL | 0 refills | Status: DC
  Filled 2020-10-30 (×2): qty 14, 7d supply, fill #0

## 2020-10-31 ENCOUNTER — Other Ambulatory Visit (HOSPITAL_COMMUNITY): Payer: Self-pay

## 2020-10-31 ENCOUNTER — Ambulatory Visit (INDEPENDENT_AMBULATORY_CARE_PROVIDER_SITE_OTHER): Payer: PPO | Admitting: Orthopaedic Surgery

## 2020-10-31 ENCOUNTER — Ambulatory Visit: Payer: PPO

## 2020-10-31 ENCOUNTER — Other Ambulatory Visit: Payer: Self-pay

## 2020-10-31 DIAGNOSIS — G8929 Other chronic pain: Secondary | ICD-10-CM

## 2020-10-31 DIAGNOSIS — M25511 Pain in right shoulder: Secondary | ICD-10-CM

## 2020-10-31 DIAGNOSIS — F330 Major depressive disorder, recurrent, mild: Secondary | ICD-10-CM | POA: Diagnosis not present

## 2020-10-31 MED ORDER — METHYLPREDNISOLONE ACETATE 40 MG/ML IJ SUSP
40.0000 mg | INTRAMUSCULAR | Status: AC | PRN
  Administered 2020-10-31: 40 mg via INTRA_ARTICULAR

## 2020-10-31 MED ORDER — ARIPIPRAZOLE 5 MG PO TABS: 5.0000 mg | ORAL_TABLET | Freq: Every morning | ORAL | 4 refills | Status: DC

## 2020-10-31 MED ORDER — BUPROPION HCL ER (XL) 300 MG PO TB24
300.0000 mg | ORAL_TABLET | Freq: Every morning | ORAL | 2 refills | Status: DC
  Filled 2020-10-31: qty 90, 90d supply, fill #0

## 2020-10-31 MED ORDER — LIDOCAINE HCL 1 % IJ SOLN
3.0000 mL | INTRAMUSCULAR | Status: AC | PRN
  Administered 2020-10-31: 10:00:00 3 mL

## 2020-10-31 MED ORDER — ARIPIPRAZOLE 5 MG PO TABS
5.0000 mg | ORAL_TABLET | Freq: Every morning | ORAL | 4 refills | Status: DC
  Filled 2020-10-31: qty 90, 90d supply, fill #0

## 2020-10-31 MED ORDER — BUPROPION HCL ER (XL) 150 MG PO TB24
300.0000 mg | ORAL_TABLET | Freq: Every morning | ORAL | 5 refills | Status: DC
  Filled 2020-10-31 – 2020-11-02 (×2): qty 60, 30d supply, fill #0
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
## 2020-11-02 ENCOUNTER — Other Ambulatory Visit (HOSPITAL_COMMUNITY): Payer: Self-pay

## 2020-11-08 DIAGNOSIS — R0683 Snoring: Secondary | ICD-10-CM | POA: Diagnosis not present

## 2020-11-08 DIAGNOSIS — G4733 Obstructive sleep apnea (adult) (pediatric): Secondary | ICD-10-CM | POA: Diagnosis not present

## 2020-11-19 ENCOUNTER — Other Ambulatory Visit (HOSPITAL_COMMUNITY): Payer: Self-pay

## 2020-11-19 DIAGNOSIS — Z86711 Personal history of pulmonary embolism: Secondary | ICD-10-CM | POA: Diagnosis not present

## 2020-11-19 DIAGNOSIS — N200 Calculus of kidney: Secondary | ICD-10-CM | POA: Diagnosis not present

## 2020-11-19 DIAGNOSIS — Z125 Encounter for screening for malignant neoplasm of prostate: Secondary | ICD-10-CM | POA: Diagnosis not present

## 2020-11-19 DIAGNOSIS — J309 Allergic rhinitis, unspecified: Secondary | ICD-10-CM | POA: Diagnosis not present

## 2020-11-19 DIAGNOSIS — R350 Frequency of micturition: Secondary | ICD-10-CM | POA: Diagnosis not present

## 2020-11-19 DIAGNOSIS — N400 Enlarged prostate without lower urinary tract symptoms: Secondary | ICD-10-CM | POA: Diagnosis not present

## 2020-11-19 DIAGNOSIS — E538 Deficiency of other specified B group vitamins: Secondary | ICD-10-CM | POA: Diagnosis not present

## 2020-11-19 DIAGNOSIS — E782 Mixed hyperlipidemia: Secondary | ICD-10-CM | POA: Diagnosis not present

## 2020-11-19 DIAGNOSIS — N182 Chronic kidney disease, stage 2 (mild): Secondary | ICD-10-CM | POA: Diagnosis not present

## 2020-11-19 DIAGNOSIS — Z148 Genetic carrier of other disease: Secondary | ICD-10-CM | POA: Diagnosis not present

## 2020-11-19 DIAGNOSIS — E039 Hypothyroidism, unspecified: Secondary | ICD-10-CM | POA: Diagnosis not present

## 2020-11-19 DIAGNOSIS — Z1389 Encounter for screening for other disorder: Secondary | ICD-10-CM | POA: Diagnosis not present

## 2020-11-19 DIAGNOSIS — Z7901 Long term (current) use of anticoagulants: Secondary | ICD-10-CM | POA: Diagnosis not present

## 2020-11-19 DIAGNOSIS — Z0000 Encounter for general adult medical examination without abnormal findings: Secondary | ICD-10-CM | POA: Diagnosis not present

## 2020-11-19 DIAGNOSIS — K219 Gastro-esophageal reflux disease without esophagitis: Secondary | ICD-10-CM | POA: Diagnosis not present

## 2020-11-19 MED ORDER — DOXYCYCLINE HYCLATE 100 MG PO CAPS
100.0000 mg | ORAL_CAPSULE | Freq: Two times a day (BID) | ORAL | 0 refills | Status: DC
  Filled 2020-11-19: qty 20, 10d supply, fill #0

## 2020-11-20 ENCOUNTER — Ambulatory Visit: Payer: PPO | Admitting: Pulmonary Disease

## 2020-11-20 ENCOUNTER — Other Ambulatory Visit (HOSPITAL_COMMUNITY): Payer: Self-pay

## 2020-12-01 ENCOUNTER — Other Ambulatory Visit (HOSPITAL_COMMUNITY): Payer: Self-pay

## 2020-12-06 ENCOUNTER — Other Ambulatory Visit (HOSPITAL_COMMUNITY): Payer: Self-pay

## 2020-12-06 DIAGNOSIS — N390 Urinary tract infection, site not specified: Secondary | ICD-10-CM | POA: Diagnosis not present

## 2020-12-06 MED ORDER — CIPROFLOXACIN HCL 500 MG PO TABS
500.0000 mg | ORAL_TABLET | Freq: Two times a day (BID) | ORAL | 0 refills | Status: DC
  Filled 2020-12-06: qty 20, 10d supply, fill #0

## 2020-12-07 ENCOUNTER — Other Ambulatory Visit (HOSPITAL_COMMUNITY): Payer: Self-pay

## 2020-12-08 DIAGNOSIS — G4733 Obstructive sleep apnea (adult) (pediatric): Secondary | ICD-10-CM | POA: Diagnosis not present

## 2020-12-08 DIAGNOSIS — R0683 Snoring: Secondary | ICD-10-CM | POA: Diagnosis not present

## 2020-12-18 ENCOUNTER — Ambulatory Visit: Payer: PPO | Admitting: Neurology

## 2020-12-18 ENCOUNTER — Encounter: Payer: Self-pay | Admitting: Neurology

## 2020-12-18 ENCOUNTER — Other Ambulatory Visit (HOSPITAL_COMMUNITY): Payer: Self-pay

## 2020-12-18 ENCOUNTER — Other Ambulatory Visit: Payer: Self-pay

## 2020-12-18 VITALS — BP 122/76 | HR 106 | Ht 70.0 in | Wt 198.6 lb

## 2020-12-18 DIAGNOSIS — F0390 Unspecified dementia without behavioral disturbance: Secondary | ICD-10-CM

## 2020-12-18 DIAGNOSIS — Z7901 Long term (current) use of anticoagulants: Secondary | ICD-10-CM | POA: Diagnosis not present

## 2020-12-18 DIAGNOSIS — N390 Urinary tract infection, site not specified: Secondary | ICD-10-CM | POA: Diagnosis not present

## 2020-12-18 MED ORDER — DONEPEZIL HCL 5 MG PO TABS
ORAL_TABLET | ORAL | 11 refills | Status: DC
  Filled 2020-12-18: qty 30, 37d supply, fill #0
  Filled 2021-01-11: qty 30, 30d supply, fill #1
  Filled 2021-02-18: qty 30, 30d supply, fill #2
  Filled 2021-03-20: qty 30, 30d supply, fill #3
  Filled 2021-04-16: qty 30, 30d supply, fill #4
  Filled 2021-05-19: qty 30, 30d supply, fill #5
  Filled 2021-06-09: qty 30, 30d supply, fill #6

## 2020-12-18 NOTE — Patient Instructions (Signed)
Restart Donepezil 5mg : take 1/2 tablet daily for 2 weeks, then increase to 1 tablet daily  2. Continue with CPAP use  3. Continue to monitor driving  4. Follow-up in 6 months, call for any changes  FALL PRECAUTIONS: Be cautious when walking. Scan the area for obstacles that may increase the risk of trips and falls. When getting up in the mornings, sit up at the edge of the bed for a few minutes before getting out of bed. Consider elevating the bed at the head end to avoid drop of blood pressure when getting up. Walk always in a well-lit room (use night lights in the walls). Avoid area rugs or power cords from appliances in the middle of the walkways. Use a walker or a cane if necessary and consider physical therapy for balance exercise. Get your eyesight checked regularly.  FINANCIAL OVERSIGHT: Supervision, especially oversight when making financial decisions or transactions is also recommended.  HOME SAFETY: Consider the safety of the kitchen when operating appliances like stoves, microwave oven, and blender. Consider having supervision and share cooking responsibilities until no longer able to participate in those. Accidents with firearms and other hazards in the house should be identified and addressed as well.  DRIVING: Regarding driving, in patients with progressive memory problems, driving will be impaired. We advise to have someone else do the driving if trouble finding directions or if minor accidents are reported. Independent driving assessment is available to determine safety of driving.  ABILITY TO BE LEFT ALONE: If patient is unable to contact 911 operator, consider using LifeLine, or when the need is there, arrange for someone to stay with patients. Smoking is a fire hazard, consider supervision or cessation. Risk of wandering should be assessed by caregiver and if detected at any point, supervision and safe proof recommendations should be instituted.  MEDICATION SUPERVISION: Inability  to self-administer medication needs to be constantly addressed. Implement a mechanism to ensure safe administration of the medications.  RECOMMENDATIONS FOR ALL PATIENTS WITH MEMORY PROBLEMS: 1. Continue to exercise (Recommend 30 minutes of walking everyday, or 3 hours every week) 2. Increase social interactions - continue going to [place] and enjoy social gatherings with friends and family 3. Eat healthy, avoid fried foods and eat more fruits and vegetables 4. Maintain adequate blood pressure, blood sugar, and blood cholesterol level. Reducing the risk of stroke and cardiovascular disease also helps promoting better memory. 5. Avoid stressful situations. Live a simple life and avoid aggravations. Organize your time and prepare for the next day in anticipation. 6. Sleep well, avoid any interruptions of sleep and avoid any distractions in the bedroom that may interfere with adequate sleep quality 7. Avoid sugar, avoid sweets as there is a strong link between excessive sugar intake, diabetes, and cognitive impairment The Mediterranean diet has been shown to help patients reduce the risk of progressive memory disorders and reduces cardiovascular risk. This includes eating fish, eat fruits and green leafy vegetables, nuts like almonds and hazelnuts, walnuts, and also use olive oil. Avoid fast foods and fried foods as much as possible. Avoid sweets and sugar as sugar use has been linked to worsening of memory function.  There is always a concern of gradual progression of memory problems. If this is the case, then we may need to adjust level of care according to patient needs. Support, both to the patient and caregiver, should then be put into place.
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
## 2020-12-20 ENCOUNTER — Ambulatory Visit: Payer: PPO | Admitting: Pulmonary Disease

## 2020-12-20 ENCOUNTER — Encounter: Payer: Self-pay | Admitting: Pulmonary Disease

## 2020-12-20 ENCOUNTER — Other Ambulatory Visit: Payer: Self-pay

## 2020-12-20 VITALS — BP 124/70 | HR 79 | Temp 97.8°F | Ht 69.0 in | Wt 199.0 lb

## 2020-12-20 DIAGNOSIS — G4733 Obstructive sleep apnea (adult) (pediatric): Secondary | ICD-10-CM | POA: Diagnosis not present

## 2020-12-20 DIAGNOSIS — F0390 Unspecified dementia without behavioral disturbance: Secondary | ICD-10-CM

## 2020-12-20 NOTE — Progress Notes (Signed)
° °  Subjective:    Patient ID: Tyler Mason, male    DOB: [birth year], 70 y.o.   MRN: [id number]  HPI  70 year old man with TBI for follow-up of OSA  he is accompanied by his wife Manuela Schwartz who is an ICU nurse He has a history of TBI since 1994 and presented with increased memory issues.   PMH -DVT/PEs, positive lupus anticoagulant, maintained on Coumadin, traumatic brain injury, hearing loss  Home sleep test showed moderate OSA and we started him on CPAP which she finally obtained about a month ago.  He is settled down with [employer] DreamWear fullface mask he is tolerating this well except for dry mouth in the mornings.  Wife states that he is better rested in the daytime, sleeps without interruption and has decreased awakenings.  His memory has improved somewhat.  Aricept was started by neurology yesterday, I reviewed her last consultation  Pressure is okay   Significant tests/ events reviewed   08/2020 HST showed moderate OSA with AHI 26/hour      Review of Systems neg for any significant sore throat, dysphagia, itching, sneezing, nasal congestion or excess/ purulent secretions, fever, chills, sweats, unintended wt loss, pleuritic or exertional cp, hempoptysis, orthopnea pnd or change in chronic leg swelling. Also denies presyncope, palpitations, heartburn, abdominal pain, nausea, vomiting, diarrhea or change in bowel or urinary habits, dysuria,hematuria, rash, arthralgias, visual complaints, headache, numbness weakness or ataxia.     Objective:   Physical Exam  Gen. Pleasant, obese, in no distress ENT - no lesions, no post nasal drip, halting speech Neck: No JVD, no thyromegaly, no carotid bruits Lungs: no use of accessory muscles, no dullness to percussion, decreased without rales or rhonchi  Cardiovascular: Rhythm regular, heart sounds  normal, no murmurs or gallops, no peripheral edema Musculoskeletal: No deformities, no cyanosis or clubbing , no tremors       Assessment &  Plan:

## 2020-12-20 NOTE — Patient Instructions (Signed)
°  CPAP is working well X trial of airfit F 30 full face mask

## 2020-12-20 NOTE — Assessment & Plan Note (Signed)
CPAP has helped improve his cognition so some of this may have been related to sleep disordered breathing

## 2020-12-20 NOTE — Assessment & Plan Note (Signed)
CPAP download was reviewed and this shows good control of events with residual AHI of 5.2/hour on auto CPAP with average pressure of 12 cm and maximum pressure of 13 cm, minimal leak. He has excellent compliance more than 7 hours every night.  CPAP is only helped decrease his hypersomnolence and fatigue and also improved his memory Weight loss encouraged, compliance with goal of at least 4-6 hrs every night is the expectation. Advised against medications with sedative side effects Cautioned against driving when sleepy - understanding that sleepiness will vary on a day to day basis  We discussed alternative treatment including inspire implant

## 2020-12-21 MED FILL — Simvastatin Tab 20 MG: ORAL | 90 days supply | Qty: 90 | Fill #0 | Status: AC

## 2020-12-22 ENCOUNTER — Other Ambulatory Visit (HOSPITAL_COMMUNITY): Payer: Self-pay

## 2020-12-22 MED FILL — Aripiprazole Tab 5 MG: ORAL | 90 days supply | Qty: 90 | Fill #0 | Status: AC

## 2020-12-24 ENCOUNTER — Other Ambulatory Visit (HOSPITAL_COMMUNITY): Payer: Self-pay

## 2020-12-27 ENCOUNTER — Other Ambulatory Visit: Payer: Self-pay

## 2020-12-27 ENCOUNTER — Observation Stay (HOSPITAL_COMMUNITY): Payer: PPO

## 2020-12-27 ENCOUNTER — Emergency Department (HOSPITAL_BASED_OUTPATIENT_CLINIC_OR_DEPARTMENT_OTHER): Payer: PPO

## 2020-12-27 ENCOUNTER — Encounter (HOSPITAL_BASED_OUTPATIENT_CLINIC_OR_DEPARTMENT_OTHER): Payer: Self-pay

## 2020-12-27 ENCOUNTER — Observation Stay (HOSPITAL_BASED_OUTPATIENT_CLINIC_OR_DEPARTMENT_OTHER)
Admission: EM | Admit: 2020-12-27 | Discharge: 2020-12-28 | Disposition: A | Discharge status: Home / Self Care | Payer: PPO | Attending: Internal Medicine | Admitting: Internal Medicine

## 2020-12-27 DIAGNOSIS — Z7901 Long term (current) use of anticoagulants: Secondary | ICD-10-CM | POA: Insufficient documentation

## 2020-12-27 DIAGNOSIS — N281 Cyst of kidney, acquired: Secondary | ICD-10-CM | POA: Diagnosis not present

## 2020-12-27 DIAGNOSIS — R760 Raised antibody titer: Secondary | ICD-10-CM | POA: Diagnosis not present

## 2020-12-27 DIAGNOSIS — E039 Hypothyroidism, unspecified: Secondary | ICD-10-CM | POA: Diagnosis not present

## 2020-12-27 DIAGNOSIS — N12 Tubulo-interstitial nephritis, not specified as acute or chronic: Secondary | ICD-10-CM | POA: Insufficient documentation

## 2020-12-27 DIAGNOSIS — Z87820 Personal history of traumatic brain injury: Secondary | ICD-10-CM

## 2020-12-27 DIAGNOSIS — R9431 Abnormal electrocardiogram [ECG] [EKG]: Secondary | ICD-10-CM | POA: Diagnosis not present

## 2020-12-27 DIAGNOSIS — R109 Unspecified abdominal pain: Secondary | ICD-10-CM

## 2020-12-27 DIAGNOSIS — R1900 Intra-abdominal and pelvic swelling, mass and lump, unspecified site: Secondary | ICD-10-CM | POA: Diagnosis not present

## 2020-12-27 DIAGNOSIS — N2889 Other specified disorders of kidney and ureter: Secondary | ICD-10-CM

## 2020-12-27 DIAGNOSIS — K7460 Unspecified cirrhosis of liver: Secondary | ICD-10-CM

## 2020-12-27 DIAGNOSIS — J439 Emphysema, unspecified: Secondary | ICD-10-CM | POA: Diagnosis not present

## 2020-12-27 DIAGNOSIS — R569 Unspecified convulsions: Secondary | ICD-10-CM | POA: Diagnosis not present

## 2020-12-27 DIAGNOSIS — Z20822 Contact with and (suspected) exposure to covid-19: Secondary | ICD-10-CM | POA: Diagnosis not present

## 2020-12-27 DIAGNOSIS — K689 Other disorders of retroperitoneum: Secondary | ICD-10-CM | POA: Diagnosis not present

## 2020-12-27 DIAGNOSIS — C649 Malignant neoplasm of unspecified kidney, except renal pelvis: Secondary | ICD-10-CM | POA: Diagnosis not present

## 2020-12-27 DIAGNOSIS — Z87891 Personal history of nicotine dependence: Secondary | ICD-10-CM | POA: Diagnosis not present

## 2020-12-27 DIAGNOSIS — N390 Urinary tract infection, site not specified: Principal | ICD-10-CM | POA: Insufficient documentation

## 2020-12-27 DIAGNOSIS — K449 Diaphragmatic hernia without obstruction or gangrene: Secondary | ICD-10-CM | POA: Diagnosis not present

## 2020-12-27 DIAGNOSIS — I700 Atherosclerosis of aorta: Secondary | ICD-10-CM | POA: Diagnosis not present

## 2020-12-27 DIAGNOSIS — K5730 Diverticulosis of large intestine without perforation or abscess without bleeding: Secondary | ICD-10-CM | POA: Diagnosis not present

## 2020-12-27 DIAGNOSIS — N200 Calculus of kidney: Secondary | ICD-10-CM | POA: Diagnosis not present

## 2020-12-27 DIAGNOSIS — G231 Progressive supranuclear ophthalmoplegia [Steele-Richardson-Olszewski]: Secondary | ICD-10-CM

## 2020-12-27 DIAGNOSIS — N10 Acute tubulo-interstitial nephritis: Secondary | ICD-10-CM | POA: Diagnosis not present

## 2020-12-27 LAB — URINALYSIS, ROUTINE W REFLEX MICROSCOPIC
Bilirubin Urine: NEGATIVE
Glucose, UA: NEGATIVE mg/dL
Ketones, ur: NEGATIVE mg/dL
Nitrite: POSITIVE — AB
Protein, ur: NEGATIVE mg/dL
Specific Gravity, Urine: 1.02 (ref 1.005–1.030)
WBC, UA: >50 WBC/hpf — ABNORMAL HIGH (ref 0–5)
pH: 6 (ref 5.0–8.0)

## 2020-12-27 LAB — CBC WITH DIFFERENTIAL/PLATELET
Abs Immature Granulocytes: 0.04 10*3/uL (ref 0.00–0.07)
Basophils Absolute: 0.1 10*3/uL (ref 0.0–0.1)
Basophils Relative: 1 %
Eosinophils Absolute: 0.3 10*3/uL (ref 0.0–0.5)
Eosinophils Relative: 2 %
HCT: 47.1 % (ref 39.0–52.0)
Hemoglobin: 15.5 g/dL (ref 13.0–17.0)
Immature Granulocytes: 0 %
Lymphocytes Relative: 13 %
Lymphs Abs: 1.7 10*3/uL (ref 0.7–4.0)
MCH: 26.9 pg (ref 26.0–34.0)
MCHC: 32.9 g/dL (ref 30.0–36.0)
MCV: 81.6 fL (ref 80.0–100.0)
Monocytes Absolute: 1.9 10*3/uL — ABNORMAL HIGH (ref 0.1–1.0)
Monocytes Relative: 15 %
Neutro Abs: 8.9 10*3/uL — ABNORMAL HIGH (ref 1.7–7.7)
Neutrophils Relative %: 69 %
Platelets: 207 10*3/uL (ref 150–400)
RBC: 5.77 MIL/uL (ref 4.22–5.81)
RDW: 15.3 % (ref 11.5–15.5)
WBC: 13 10*3/uL — ABNORMAL HIGH (ref 4.0–10.5)
nRBC: 0 % (ref 0.0–0.2)

## 2020-12-27 LAB — COMPREHENSIVE METABOLIC PANEL
ALT: 29 U/L (ref 0–44)
AST: 28 U/L (ref 15–41)
Albumin: 3.8 g/dL (ref 3.5–5.0)
Alkaline Phosphatase: 75 U/L (ref 38–126)
Anion gap: 9 (ref 5–15)
BUN: 18 mg/dL (ref 8–23)
CO2: 24 mmol/L (ref 22–32)
Calcium: 9.3 mg/dL (ref 8.9–10.3)
Chloride: 107 mmol/L (ref 98–111)
Creatinine, Ser: 0.97 mg/dL (ref 0.61–1.24)
GFR, Estimated: >60 mL/min (ref >60)
Glucose, Bld: 110 mg/dL — ABNORMAL HIGH (ref 70–99)
Potassium: 3.9 mmol/L (ref 3.5–5.1)
Sodium: 140 mmol/L (ref 135–145)
Total Bilirubin: 0.8 mg/dL (ref 0.3–1.2)
Total Protein: 6.6 g/dL (ref 6.5–8.1)

## 2020-12-27 LAB — RESP PANEL BY RT-PCR (FLU A&B, COVID) ARPGX2
Influenza A by PCR: NEGATIVE
Influenza B by PCR: NEGATIVE
SARS Coronavirus 2 by RT PCR: NEGATIVE

## 2020-12-27 LAB — CBG MONITORING, ED: Glucose-Capillary: 98 mg/dL (ref 70–99)

## 2020-12-27 LAB — LACTIC ACID, PLASMA: Lactic Acid, Venous: 1 mmol/L (ref 0.5–1.9)

## 2020-12-27 LAB — PROTIME-INR
INR: 2 — ABNORMAL HIGH (ref 0.8–1.2)
Prothrombin Time: 23.1 seconds — ABNORMAL HIGH (ref 11.4–15.2)

## 2020-12-27 MED ORDER — GADOBUTROL 1 MMOL/ML IV SOLN
10.0000 mL | Freq: Once | INTRAVENOUS | Status: AC | PRN
  Administered 2020-12-27: 22:00:00 10 mL via INTRAVENOUS

## 2020-12-27 MED ORDER — SODIUM CHLORIDE 0.9 % IV SOLN
1.0000 g | Freq: Once | INTRAVENOUS | Status: AC
  Administered 2020-12-27: 14:00:00 1 g via INTRAVENOUS
  Filled 2020-12-27: qty 10

## 2020-12-27 MED ORDER — WARFARIN - PHARMACIST DOSING INPATIENT: Freq: Every day | Status: DC

## 2020-12-27 MED ORDER — SIMVASTATIN 20 MG PO TABS
20.0000 mg | ORAL_TABLET | Freq: Every day | ORAL | Status: DC
  Administered 2020-12-27 – 2020-12-28 (×2): 20 mg via ORAL
  Filled 2020-12-27 (×2): qty 1

## 2020-12-27 MED ORDER — BUPROPION HCL ER (XL) 300 MG PO TB24
300.0000 mg | ORAL_TABLET | Freq: Every morning | ORAL | Status: DC
  Administered 2020-12-28: 10:00:00 300 mg via ORAL
  Filled 2020-12-27: qty 1

## 2020-12-27 MED ORDER — WARFARIN SODIUM 5 MG PO TABS
5.0000 mg | ORAL_TABLET | Freq: Once | ORAL | Status: AC
  Administered 2020-12-27: 20:00:00 5 mg via ORAL
  Filled 2020-12-27: qty 1

## 2020-12-27 MED ORDER — FENTANYL CITRATE PF 50 MCG/ML IJ SOSY
50.0000 ug | PREFILLED_SYRINGE | Freq: Once | INTRAMUSCULAR | Status: AC
  Administered 2020-12-27: 13:00:00 50 ug via INTRAVENOUS
  Filled 2020-12-27: qty 1

## 2020-12-27 MED ORDER — HYDROCODONE-ACETAMINOPHEN 5-325 MG PO TABS
1.0000 | ORAL_TABLET | ORAL | Status: DC | PRN
Start: 2020-12-27 — End: 2020-12-28
  Administered 2020-12-27: 21:00:00 1 via ORAL
  Administered 2020-12-27: 17:00:00 2 via ORAL
  Administered 2020-12-28: 04:00:00 1 via ORAL
  Filled 2020-12-27: qty 1
  Filled 2020-12-27: qty 2
  Filled 2020-12-27: qty 1

## 2020-12-27 MED ORDER — IBUPROFEN 400 MG PO TABS: 400.0000 mg | ORAL_TABLET | ORAL | Status: DC | PRN

## 2020-12-27 MED ORDER — PANTOPRAZOLE SODIUM 40 MG PO TBEC
40.0000 mg | DELAYED_RELEASE_TABLET | Freq: Every day | ORAL | Status: DC
  Administered 2020-12-27 – 2020-12-28 (×2): 40 mg via ORAL
  Filled 2020-12-27 (×2): qty 1

## 2020-12-27 MED ORDER — ARIPIPRAZOLE 5 MG PO TABS
5.0000 mg | ORAL_TABLET | Freq: Every morning | ORAL | Status: DC
  Administered 2020-12-28: 10:00:00 5 mg via ORAL
  Filled 2020-12-27: qty 1

## 2020-12-27 MED ORDER — LEVOTHYROXINE SODIUM 75 MCG PO TABS
75.0000 ug | ORAL_TABLET | Freq: Every morning | ORAL | Status: DC
  Administered 2020-12-28: 06:00:00 75 ug via ORAL
  Filled 2020-12-27: qty 1

## 2020-12-27 MED ORDER — IOHEXOL 350 MG/ML SOLN
60.0000 mL | Freq: Once | INTRAVENOUS | Status: AC | PRN
  Administered 2020-12-27: 21:00:00 60 mL via INTRAVENOUS

## 2020-12-27 MED ORDER — ENOXAPARIN SODIUM 40 MG/0.4ML IJ SOSY: 40.0000 mg | PREFILLED_SYRINGE | INTRAMUSCULAR | Status: DC

## 2020-12-27 MED ORDER — SODIUM CHLORIDE 0.9 % IV BOLUS
1000.0000 mL | Freq: Once | INTRAVENOUS | Status: AC
  Administered 2020-12-27: 13:00:00 1000 mL via INTRAVENOUS

## 2020-12-27 MED ORDER — SODIUM CHLORIDE 0.9 % IV SOLN
1.0000 g | INTRAVENOUS | Status: DC
  Administered 2020-12-28: 14:00:00 1 g via INTRAVENOUS
  Filled 2020-12-27: qty 10

## 2020-12-27 MED ORDER — ACETAMINOPHEN 325 MG PO TABS: 650.0000 mg | ORAL_TABLET | ORAL | Status: DC | PRN

## 2020-12-27 MED ORDER — DONEPEZIL HCL 10 MG PO TABS
5.0000 mg | ORAL_TABLET | Freq: Every day | ORAL | Status: DC
  Administered 2020-12-27: 21:00:00 5 mg via ORAL
  Filled 2020-12-27: qty 1

## 2020-12-27 NOTE — ED Triage Notes (Signed)
Pt c/o R flank pain that radiates into the groin. Pt recently finished 3 rounds abx for a UTI. Denies fevers at home. TMax per wife was 100.

## 2020-12-27 NOTE — Progress Notes (Signed)
[place] for warfarin Indication:hx pulmonary embolus and DVT and lupus anticoagulant  Allergies  Allergen Reactions   Ampicillin Other (See Comments)    Made tongue sore Has patient had a PCN reaction causing immediate rash, facial/tongue/throat swelling, SOB or lightheadedness with hypotension: no Has patient had a PCN reaction causing severe rash involving mucus membranes or skin necrosis: no Has patient had a PCN reaction that required hospitalization no Has patient had a PCN reaction occurring within the last 10 years: no If all of the above answers are "NO", then may proceed with Cephalosporin use.    Macrodantin [Nitrofurantoin Macrocrystal] Itching, Swelling and Rash    Patient Measurements: Height: 5\' 9"  (175.3 cm) Weight: 90.7 kg (200 lb) IBW/kg (Calculated) : 70.7 Heparin Dosing Weight:   Vital Signs: Temp: 98.3 F (36.8 C) (12/22 1605) Temp Source: Oral (12/22 1605) BP: 137/83 (12/22 1605) Pulse Rate: 82 (12/22 1605)  Labs: Recent Labs    12/27/20 1153 12/27/20 1217  HGB 15.5  --   HCT 47.1  --   PLT 207  --   LABPROT  --  23.1*  INR  --  2.0*  CREATININE 0.97  --     Estimated Creatinine Clearance: 80 mL/min (by C-G formula based on SCr of 0.97 mg/dL).   Medications:  - Per pt's wife, his home warfarin regimen is 5mg  daily except 7.5 mg on Fri and Sun (last dose taken on 12/21). INR goal is 2-3.  Assessment: Patient is a 70 y.o M with hx PE/DVT and lupus anticoagulant on warfarin PTA presented to the ED on 12/27/20 with c/o right flank pain. Warfarin resumed on admission.  Today, 12/27/2020: - INR 2 - cbc ok - drug-drug intxns: being on abx (on ceftriaxone) can make pt more sensitive to warfarin - spoke to Dr. Wynelle Cleveland, no procedure is planned for patient at this time with this admission so ok to resume warfarin tonight  Goal of Therapy:  INR 2-3 Monitor platelets by anticoagulation protocol: Yes   Plan:   - warfarin 5mg  PO x1 today - d/c lovenox 40 mg daily since INR is at goal - daily INR - monitor for s/sx bleeding  Dia Sitter P 12/27/2020,6:20 PM
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
## 2020-12-28 ENCOUNTER — Other Ambulatory Visit (HOSPITAL_COMMUNITY): Payer: Self-pay

## 2020-12-28 DIAGNOSIS — Z87820 Personal history of traumatic brain injury: Secondary | ICD-10-CM | POA: Diagnosis not present

## 2020-12-28 DIAGNOSIS — E039 Hypothyroidism, unspecified: Secondary | ICD-10-CM | POA: Diagnosis not present

## 2020-12-28 DIAGNOSIS — N10 Acute tubulo-interstitial nephritis: Secondary | ICD-10-CM

## 2020-12-28 DIAGNOSIS — R109 Unspecified abdominal pain: Secondary | ICD-10-CM

## 2020-12-28 DIAGNOSIS — R760 Raised antibody titer: Secondary | ICD-10-CM | POA: Diagnosis not present

## 2020-12-28 DIAGNOSIS — N2889 Other specified disorders of kidney and ureter: Secondary | ICD-10-CM

## 2020-12-28 DIAGNOSIS — N200 Calculus of kidney: Secondary | ICD-10-CM | POA: Diagnosis not present

## 2020-12-28 DIAGNOSIS — K7460 Unspecified cirrhosis of liver: Secondary | ICD-10-CM | POA: Diagnosis not present

## 2020-12-28 DIAGNOSIS — K689 Other disorders of retroperitoneum: Secondary | ICD-10-CM | POA: Diagnosis not present

## 2020-12-28 LAB — HIV ANTIBODY (ROUTINE TESTING W REFLEX): HIV Screen 4th Generation wRfx: NONREACTIVE

## 2020-12-28 LAB — PROTIME-INR
INR: 2.4 — ABNORMAL HIGH (ref 0.8–1.2)
Prothrombin Time: 26.2 seconds — ABNORMAL HIGH (ref 11.4–15.2)

## 2020-12-28 MED ORDER — OXYCODONE HCL 5 MG PO TABS
10.0000 mg | ORAL_TABLET | ORAL | Status: DC | PRN
  Administered 2020-12-28: 10:00:00 10 mg via ORAL
  Filled 2020-12-28: qty 2

## 2020-12-28 MED ORDER — OXYCODONE HCL 5 MG PO TABS: 5.0000 mg | ORAL_TABLET | ORAL | Status: DC | PRN

## 2020-12-28 MED ORDER — SENNA 8.6 MG PO TABS
1.0000 | ORAL_TABLET | Freq: Every day | ORAL | 0 refills | Status: DC | PRN
  Filled 2020-12-28: qty 120, 120d supply, fill #0

## 2020-12-28 MED ORDER — CEFDINIR 300 MG PO CAPS
300.0000 mg | ORAL_CAPSULE | Freq: Two times a day (BID) | ORAL | 0 refills | Status: AC
  Filled 2020-12-28 (×2): qty 12, 6d supply, fill #0

## 2020-12-28 MED ORDER — OXYCODONE HCL 5 MG PO TABS
5.0000 mg | ORAL_TABLET | ORAL | 0 refills | Status: DC | PRN
  Filled 2020-12-28: qty 30, 3d supply, fill #0

## 2020-12-28 MED ORDER — WARFARIN SODIUM 5 MG PO TABS: 5.0000 mg | ORAL_TABLET | Freq: Once | ORAL | Status: DC

## 2020-12-28 MED ORDER — DULCOLAX 5 MG PO TBEC
5.0000 mg | DELAYED_RELEASE_TABLET | Freq: Every day | ORAL | 1 refills | Status: DC | PRN
  Filled 2020-12-28: qty 30, 30d supply, fill #0

## 2020-12-28 MED ORDER — ACETAMINOPHEN 325 MG PO TABS: 650.0000 mg | ORAL_TABLET | ORAL | Status: AC | PRN

## 2020-12-28 MED ORDER — IBUPROFEN 400 MG PO TABS
400.0000 mg | ORAL_TABLET | ORAL | 0 refills | Status: DC | PRN
  Filled 2020-12-28: qty 30, 5d supply, fill #0

## 2020-12-28 NOTE — Progress Notes (Signed)
Subjective: Geno continues to have some pain but it is lower and just above the hip and he also has urinary frequency q1hr.  The urine culture is pending.  Chest CT shows an enlarged left hilar node.  The MRI shows a 2.2cm RUP renal mass that could be RCCa and the 5cm retroperitoneal mass has central necrosis and is felt to be adenopathy but a primary retroperitoneal neoplasm is not ruled out.  ROS:  Review of Systems  Unable to perform ROS: Mental acuity   Anti-infectives: Anti-infectives (From admission, onward)    Start     Dose/Rate Route Frequency Ordered Stop   12/28/20 1400  cefTRIAXone (ROCEPHIN) 1 g in sodium chloride 0.9 % 100 mL IVPB        1 g 200 mL/hr over 30 Minutes Intravenous Every 24 hours 12/27/20 1724     12/27/20 1345  cefTRIAXone (ROCEPHIN) 1 g in sodium chloride 0.9 % 100 mL IVPB        1 g 200 mL/hr over 30 Minutes Intravenous  Once 12/27/20 1340 12/27/20 1432       Current Facility-Administered Medications  Medication Dose Route Frequency Provider Last Rate Last Admin   acetaminophen (TYLENOL) tablet 650 mg  650 mg Oral Q4H PRN [name], MD       ARIPiprazole (ABILIFY) tablet 5 mg  5 mg Oral q morning Rizwan, Saima, MD       buPROPion (WELLBUTRIN XL) 24 hr tablet 300 mg  300 mg Oral q morning Rizwan, Saima, MD       cefTRIAXone (ROCEPHIN) 1 g in sodium chloride 0.9 % 100 mL IVPB  1 g Intravenous Q24H Rizwan, Eunice Blase, MD       donepezil (ARICEPT) tablet 5 mg  5 mg Oral QHS [name], MD   5 mg at 12/27/20 2110   HYDROcodone-acetaminophen (NORCO/VICODIN) 5-325 MG per tablet 1-2 tablet  1-2 tablet Oral Q4H PRN [name], MD   1 tablet at 12/28/20 0400   ibuprofen (ADVIL) tablet 400 mg  400 mg Oral Q4H PRN [name], MD       levothyroxine (SYNTHROID) tablet 75 mcg  75 mcg Oral q morning [name], MD   75 mcg at 12/28/20 0543   pantoprazole (PROTONIX) EC tablet 40 mg  40 mg Oral Daily [name], MD   40 mg at 12/27/20 1707    simvastatin (ZOCOR) tablet 20 mg  20 mg Oral Daily [name], MD   20 mg at 12/27/20 1707   Warfarin - Pharmacist Dosing Inpatient   Does not apply q1600 Pham, Anh P, RPH         Objective: Vital signs in last 24 hours: Temp:  [98.3 F (36.8 C)-98.7 F (37.1 C)] 98.7 F (37.1 C) (12/23 0519) Pulse Rate:  [63-92] 89 (12/23 0519) Resp:  [14-20] 16 (12/23 0519) BP: (71-152)/(54-89) 151/70 (12/23 0519) SpO2:  [89 %-97 %] 89 % (12/23 0519) Weight:  [90.7 kg] 90.7 kg (12/22 1146)  Intake/Output from previous day: 12/22 0701 - 12/23 0700 In: 1661.9 [P.O.:600; IV Piggyback:1061.9] Out: 5573 [UKGUR:4270] Intake/Output this shift: No intake/output data recorded.   Physical Exam Vitals reviewed.  Constitutional:      Appearance: Normal appearance.  Abdominal:     Comments: Mild tenderness in the right low flank.   Neurological:     Mental Status: He is alert.    Lab Results:  Recent Labs    12/27/20 1153  WBC 13.0*  HGB 15.5  HCT 47.1  PLT 207   BMET Recent Labs    12/27/20 1153  NA 140  K 3.9  CL 107  CO2 24  GLUCOSE 110*  BUN 18  CREATININE 0.97  CALCIUM 9.3   PT/INR Recent Labs    12/27/20 1217 12/28/20 0502  LABPROT 23.1* 26.2*  INR 2.0* 2.4*   ABG No results for input(s): PHART, HCO3 in the last 72 hours.  Invalid input(s): PCO2, PO2  Studies/Results: CT Head Wo Contrast  Result Date: 12/27/2020 CLINICAL DATA:  Seizure, new-onset, no history of trauma EXAM: CT HEAD WITHOUT CONTRAST TECHNIQUE: Contiguous axial images were obtained from the base of the skull through the vertex without intravenous contrast. COMPARISON:  06/22/2019 FINDINGS: Brain: There is no acute intracranial hemorrhage, mass effect, or edema. No new loss of gray-white differentiation. Small area of encephalomalacia right frontal lobe. There is no extra-axial fluid collection. Prominence of the ventricles and sulci reflects stable parenchymal volume loss. Additional patchy  hypoattenuation in the supratentorial white matter is nonspecific but probably reflects stable chronic microvascular ischemic changes. Vascular: There is atherosclerotic calcification at the skull base. Skull: Calvarium is unremarkable apart from prior right craniotomy. Sinuses/Orbits: No acute finding. Other: None. IMPRESSION: No acute intracranial abnormality. Stable chronic findings detailed above. Electronically Signed   By: Macy Mis M.D.   On: 12/27/2020 12:53   CT CHEST W CONTRAST  Result Date: 12/27/2020 CLINICAL DATA:  Pathologic adenopathy EXAM: CT CHEST WITH CONTRAST TECHNIQUE: Multidetector CT imaging of the chest was performed during intravenous contrast administration. CONTRAST:  71m OMNIPAQUE IOHEXOL 350 MG/ML SOLN COMPARISON:  09/17/2015 FINDINGS: Cardiovascular: Moderate coronary artery calcification largely within the left anterior descending coronary artery. Global cardiac size within normal limits. No pericardial effusion. Central pulmonary arteries are of normal caliber. Mild atherosclerotic calcification within the thoracic aorta. No aortic aneurysm. Aberrant origin of the right subclavian artery noted. Mediastinum/Nodes: Visualized thyroid is unremarkable. A a pathologically enlarged heterogeneously enhancing left hilar lymph node is seen at axial image # 43/2 measuring 19 mm x 33 mm. No additional pathologic thoracic adenopathy identified. The esophagus is unremarkable. Lungs/Pleura: Mild paraseptal emphysema. No focal pulmonary nodules or infiltrates. No pneumothorax or pleural effusion. Central airways are widely patent. Upper Abdomen: Pathologic retrocaval lymph node is partially visualized at the inferior margin of the examination. Bilateral nonobstructing nephrolithiasis noted. No acute abnormality within the visualized abdomen. Musculoskeletal: No acute bone abnormality. No lytic or blastic bone lesion. Remote healed sternal fracture noted. Progressive loss of height of  severe T7 compression fracture without retropulsion. No lytic or blastic bone lesion. IMPRESSION: Isolated pathologically enlarged left hilar lymph node. Moderate coronary artery calcification. Mild paraseptal emphysema. Pathologically enlarged retrocaval lymph node partially visualized. Bilateral nonobstructing nephrolithiasis. Aortic Atherosclerosis (ICD10-I70.0) and Emphysema (ICD10-J43.9). Electronically Signed   By: AFidela SalisburyM.D.   On: 12/27/2020 21:08   MR ABDOMEN W WO CONTRAST  Result Date: 12/28/2020 CLINICAL DATA:  Abdominal mass on CT. EXAM: MRI ABDOMEN WITHOUT AND WITH CONTRAST TECHNIQUE: Multiplanar multisequence MR imaging of the abdomen was performed both before and after the administration of intravenous contrast. CONTRAST:  121mGADAVIST GADOBUTROL 1 MMOL/ML IV SOLN COMPARISON:  CT stone study earlier same day FINDINGS: Lower chest: Unremarkable. Hepatobiliary: No suspicious focal abnormality within the liver parenchyma. There is no evidence for gallstones, gallbladder wall thickening, or pericholecystic fluid. No intrahepatic or extrahepatic biliary dilation. Pancreas: No focal mass lesion. No dilatation of the main duct. No intraparenchymal cyst. No peripancreatic edema. Spleen:  No splenomegaly. No focal  mass lesion. Adrenals/Urinary Tract:  No adrenal nodule or mass. Several cysts are noted in the right kidney measuring up to 1.2 cm in the posterior interpolar region. Patient also has a 2.2 cm T2 hypointense, T1 isointense lesion in the medial upper pole right kidney (axial T2 image 18/7 and axial 45 second postcontrast image 51 of series 20). Cortical scarring noted upper pole right kidney. Tiny subcapsular cyst noted posterior interpolar left kidney, otherwise unremarkable. Stomach/Bowel: Tiny hiatal hernia. Stomach otherwise unremarkable. Duodenum is normally positioned as is the ligament of Treitz. No small bowel or colonic dilatation within the visualized abdomen.  Vascular/Lymphatic: No abdominal aortic aneurysm. 5.3 x 3.3 cm well-defined oval soft tissue lesion identified in the retrocaval space of the abdomen, just caudal to the left renal vein. This shows peripheral rim enhancement with minimal heterogeneous central enhancement. Appearance is not classic for focal hematoma necrotic lymph node would be a distinct consideration. Just cranial to this lesion are 2 additional lymph nodes, measuring 14 mm and 9 mm in short axis respectively. Somewhat prominent retroperitoneal edema is seen posterior to and tracking caudally from the dominant lesion. Other:  No intraperitoneal free fluid. Musculoskeletal: No focal suspicious marrow enhancement within the visualized bony anatomy. IMPRESSION: 1. 5.3 x 3.3 x 3.2 cm well-defined oval soft tissue lesion in the retrocaval space, just caudal to the left renal vein. Imaging features likely reflect necrotic lymphadenopathy. Primary retroperitoneal neoplasm/sarcoma not excluded. Focal hematoma considered less likely. As noted on the CT scan, there are 2 borderline to mildly enlarged lymph nodes just cranial to this dominant lesion. 2. 2.2 cm lesion in the medial upper pole right kidney with enhancement after IV contrast administration. Small renal cell carcinoma a concern. 3. Renal stone disease seen on CT is less apparent on MRI. 4. Tiny hiatal hernia. Electronically Signed   By: Misty Stanley M.D.   On: 12/28/2020 05:23   CT Renal Stone Study  Result Date: 12/27/2020 CLINICAL DATA:  Flank pain EXAM: CT ABDOMEN AND PELVIS WITHOUT CONTRAST TECHNIQUE: Multidetector CT imaging of the abdomen and pelvis was performed following the standard protocol without IV contrast. COMPARISON:  CT abdomen and pelvis 09/17/2015 FINDINGS: Lower chest: No acute abnormality. Hepatobiliary: Liver is small with nodular contour and relative prominence of the left hepatic lobe, consistent with cirrhosis. No suspicious hepatic mass appreciated. Gallbladder  appears within normal limits. No biliary ductal dilatation identified. Pancreas: Unremarkable. No pancreatic ductal dilatation or surrounding inflammatory changes. Spleen: Enlarged measuring 15 cm in length. Adrenals/Urinary Tract: Adrenal glands are normal. A few nonobstructing renal calculi identified bilaterally measuring up to 12 mm in the lower pole on the right and 3 mm in the upper pole on the left. No ureteral calculi or hydronephrosis identified bilaterally. 12 mm hypodense likely cyst in the mid right kidney noted. Urinary bladder appears within normal limits, incompletely distended. Stomach/Bowel: No bowel obstruction, free air or pneumatosis. Small hiatal hernia. 1 cm round fat density again seen within the duodenum, likely lipoma. Mild colonic diverticulosis. No bowel wall edema visualized. Appendix is normal. Vascular/Lymphatic: Moderate to severe atherosclerotic disease. Large solid mass/lymph node in the right retroperitoneum centered at the level of the lower pole of the right kidney measuring 3.4 x 5.5 x 4.1 cm in size which is posterior to and abutting the IVC. There are 2 adjacent additional solid nodules/lymph nodes just superiorly measuring up to 2 cm in size. There is mild fat stranding surrounding the masses/nodes as well as mild edema extending inferiorly anterior  to the proximal psoas muscle. No lymphadenopathy identified elsewhere in the abdomen or pelvis. Reproductive: Prostate gland is normal size. Other: No ascites. Musculoskeletal: No suspicious bony lesions identified. IMPRESSION: 1. Three adjacent solid masses/lymph nodes identified in the right retroperitoneum with the largest measuring up to 5.5 cm centered at the level of the lower right kidney. Mild surrounding fat stranding and edema. May be neoplastic/metastatic, reactive or infectious/inflammatory in nature. The patient has a remote history of adenopathy in the chest, correlate with history and follow-up as indicated. 2.  Bilateral nephrolithiasis with no obstructive uropathy. 3. Hepatic cirrhosis. 4. Splenomegaly consistent with portal hypertension. 5. Mild colonic diverticulosis.  Small hiatal hernia. Electronically Signed   By: Ofilia Neas M.D.   On: 12/27/2020 13:05     Assessment and Plan: UTI.  He continues to have frequency q1hr.  His culture is still pending.  Adjust antibiotics based on culture.  Right retroperitoneal mass.  The MRI shows a necrotic mass which could be a primarily retroperitoneal lesion or possibly a met from the right renal mass.  He also has a hilar pulmonary node.  Recommendation for biopsy as an outpatient remains appropriate.  Right renal mass.   There is a 2.2cm RUP medial renal mass.   This could potentially be biopsied at the time of the retroperitoneal lesion for completeness.  It is unusual for a small renal mass of the size to metastasis but not unheard of.   Renal stones.  He has non-obstructing stones that are not likely responsible for his pain.       LOS: 0 days    Irine Seal 12/28/2020 [phone] Patient ID: Tyler Mason, male   DOB: 12-Sep-1950, 70 y.o.   MRN: [id number]

## 2020-12-28 NOTE — Progress Notes (Signed)
°  Transition of Care [facility]) Screening Note   Patient Details  Name: BRAN ALDRIDGE Date of Birth: [birth year]   Transition of Care [facility]) CM/SW Contact:    Joaquin Courts, RN Phone Number: 12/28/2020, 4:07 PM    Transition of Care Department [facility]) has reviewed patient and no TOC needs have been identified at this time. We will continue to monitor patient advancement through interdisciplinary progression rounds. If new patient transition needs arise, please place a TOC consult.

## 2020-12-28 NOTE — Progress Notes (Signed)
Discharge instructions discussed with patient and family, verbalized agreement and understanding 

## 2020-12-28 NOTE — Discharge Summary (Signed)
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 None   Collection Time: 12/27/20  2:30 PM   Specimen: Nasopharyngeal Swab; Nasopharyngeal(NP) swabs in vial transport medium  Result Value Ref Range Status   SARS Coronavirus 2 by RT PCR NEGATIVE NEGATIVE Final    Comment: (NOTE) SARS-CoV-2 target nucleic acids are NOT DETECTED.  The SARS-CoV-2 RNA is generally detectable in upper  respiratory specimens during the acute phase of infection. The lowest concentration of  SARS-CoV-2 viral copies this assay can detect is 138 copies/mL. A negative result does not preclude SARS-Cov-2 infection and should not be used as the sole basis for treatment or other patient management decisions. A negative result may occur with  improper specimen collection/handling, submission of specimen other than nasopharyngeal swab, presence of viral mutation(s) within the areas targeted by this assay, and inadequate number of viral copies(<138 copies/mL). A negative result must be combined with clinical observations, patient history, and epidemiological information. The expected result is Negative.  Fact Sheet for Patients:  EntrepreneurPulse.com.au  Fact Sheet for Healthcare Providers:  IncredibleEmployment.be  This test is no t yet approved or cleared by the [place] FDA and  has been authorized for detection and/or diagnosis of SARS-CoV-2 by FDA under an Emergency Use Authorization (EUA). This EUA will remain  in effect (meaning this test can be used) for the duration of the COVID-19 declaration under Section 564(b)(1) of the Act, 21 U.S.C.section 360bbb-3(b)(1), unless the authorization is terminated  or revoked sooner.       Influenza A by PCR NEGATIVE NEGATIVE Final   Influenza B by PCR NEGATIVE NEGATIVE Final    Comment: (NOTE) The Xpert Xpress SARS-CoV-2/FLU/RSV plus assay is intended as an aid in the diagnosis of influenza from Nasopharyngeal swab specimens and should not be used as a sole basis for treatment. Nasal washings and aspirates are unacceptable for Xpert Xpress SARS-CoV-2/FLU/RSV testing.  Fact Sheet for Patients: EntrepreneurPulse.com.au  Fact Sheet for Healthcare Providers: IncredibleEmployment.be  This test is not yet approved or cleared by the [place] FDA and has been authorized for detection and/or diagnosis of SARS-CoV-2 by FDA under an Emergency Use  Authorization (EUA). This EUA will remain in effect (meaning this test can be used) for the duration of the COVID-19 declaration under Section 564(b)(1) of the Act, 21 U.S.C. section 360bbb-3(b)(1), unless the authorization is terminated or revoked.  Performed at [facility], [address] [place], [place] [address]      Time coordinating discharge in minutes: 60  SIGNED:   [name], MD  [facility] 12/28/2020, 2:03 PM

## 2020-12-28 NOTE — Progress Notes (Signed)
Patient ID: Tyler Mason, male   DOB: 05/08/1950, 70 y.o.   MRN: [id number] Request received for retroperitoneal mass bx on pt. Imaging studies reviewed by Dr.Mugweru.  The left retroperitoneal mass is amenable to percutaneous biopsy under CT guidance.  Patient currently on Coumadin.  He will need to hold for 5 days prior to biopsy.  This procedure will be scheduled as an outpatient.  Our schedulers will call the patient to arrange for date and time of biopsy. Please call [phone] with any scheduling concerns.

## 2020-12-28 NOTE — Progress Notes (Signed)
[place] for warfarin Indication:hx pulmonary embolus and DVT and lupus anticoagulant  Allergies  Allergen Reactions   Ampicillin Other (See Comments)    Made tongue sore Has patient had a PCN reaction causing immediate rash, facial/tongue/throat swelling, SOB or lightheadedness with hypotension: no Has patient had a PCN reaction causing severe rash involving mucus membranes or skin necrosis: no Has patient had a PCN reaction that required hospitalization no Has patient had a PCN reaction occurring within the last 10 years: no If all of the above answers are "NO", then may proceed with Cephalosporin use.    Macrodantin [Nitrofurantoin Macrocrystal] Itching, Swelling and Rash    Patient Measurements: Height: 5\' 9"  (175.3 cm) Weight: 90.7 kg (200 lb) IBW/kg (Calculated) : 70.7 Heparin Dosing Weight:   Vital Signs: Temp: 98.7 F (37.1 C) (12/23 0519) Temp Source: Oral (12/23 0519) BP: 151/70 (12/23 0519) Pulse Rate: 89 (12/23 0519)  Labs: Recent Labs    12/27/20 1153 12/27/20 1217 12/28/20 0502  HGB 15.5  --   --   HCT 47.1  --   --   PLT 207  --   --   LABPROT  --  23.1* 26.2*  INR  --  2.0* 2.4*  CREATININE 0.97  --   --      Estimated Creatinine Clearance: 80 mL/min (by C-G formula based on SCr of 0.97 mg/dL).   Medications:  - Per pt's wife, his home warfarin regimen is 5mg  daily except 7.5 mg on Fri and Sun (last dose taken on 12/21). INR goal is 2-3.  Assessment: Patient is a 70 y.o M with hx PE/DVT and lupus anticoagulant on warfarin PTA presented to the ED on 12/27/20 with c/o right flank pain. Warfarin resumed on admission.  Today, 12/28/2020: - INR is therapeutic but increased from 2 to 2.4 - cbc  on 12/22 ok - drug-drug intxns: being on abx (on ceftriaxone) can make pt more sensitive to warfarin - urology team recom outpatient biopsy of right retroperitoneal mass and right renal mass   Goal of Therapy:  INR  2-3 Monitor platelets by anticoagulation protocol: Yes   Plan:  - Typical home dose is 7.5mg  on Friday (today), but since INR increased noticeably over night, will give warfarin 5mg  PO x1 today - daily INR - monitor for s/sx bleeding  Kramer Hanrahan P 12/28/2020,10:03 AM

## 2020-12-28 NOTE — Progress Notes (Signed)
Mobility Specialist - Cancellation Note  Reason: Pt refused mobility at this time, prefers to sit up in recliner as he awaits d/c.   [place] Specialist [facility] Phone: [phone] 12/28/20, 2:18 PM

## 2020-12-29 LAB — URINE CULTURE: Culture: >=100000 — AB

## 2021-01-01 ENCOUNTER — Other Ambulatory Visit: Payer: Self-pay | Admitting: Urology

## 2021-01-01 ENCOUNTER — Other Ambulatory Visit (HOSPITAL_COMMUNITY): Payer: Self-pay | Admitting: Urology

## 2021-01-01 DIAGNOSIS — N2889 Other specified disorders of kidney and ureter: Secondary | ICD-10-CM

## 2021-01-01 LAB — CULTURE, BLOOD (ROUTINE X 2): Culture: NO GROWTH

## 2021-01-04 DIAGNOSIS — R55 Syncope and collapse: Secondary | ICD-10-CM | POA: Diagnosis not present

## 2021-01-04 DIAGNOSIS — N390 Urinary tract infection, site not specified: Secondary | ICD-10-CM | POA: Diagnosis not present

## 2021-01-04 DIAGNOSIS — N2889 Other specified disorders of kidney and ureter: Secondary | ICD-10-CM | POA: Diagnosis not present

## 2021-01-08 ENCOUNTER — Other Ambulatory Visit (HOSPITAL_COMMUNITY): Payer: Self-pay

## 2021-01-08 DIAGNOSIS — G4733 Obstructive sleep apnea (adult) (pediatric): Secondary | ICD-10-CM | POA: Diagnosis not present

## 2021-01-08 DIAGNOSIS — R0683 Snoring: Secondary | ICD-10-CM | POA: Diagnosis not present

## 2021-01-12 ENCOUNTER — Other Ambulatory Visit (HOSPITAL_COMMUNITY): Payer: Self-pay

## 2021-01-14 ENCOUNTER — Encounter (HOSPITAL_COMMUNITY): Payer: Self-pay

## 2021-01-14 ENCOUNTER — Other Ambulatory Visit: Payer: Self-pay | Admitting: Urology

## 2021-01-14 DIAGNOSIS — N2889 Other specified disorders of kidney and ureter: Secondary | ICD-10-CM

## 2021-01-14 NOTE — Progress Notes (Signed)
Tyler Mason, [place] Legal Sex  Male DOB  1950/03/12 SSN  [id number] Address  [place]  [place] [address] Phone  [phone] (Home) *Preferred*  [phone] (Mobile)    RE: CT ABDOMINAL MASS BIOPSY Received: Today Sandi Mariscal, MD  Valli Glance OK for CT guided R RP mass Bx.   Abd MRI - 12/22 - image 66, series 20; CT AP - 12/22 - image 42, series 2.   Thx,  Jayt        Previous Messages   ----- Message -----  From: Valli Glance  Sent: 01/14/2021   2:46 PM EST  To: Ir Procedure Requests  Subject: CT ABDOMINAL MASS BIOPSY                       CT ABDOMINAL MASS BIOPSY      Reason: Renal mass       History: MRI and CT In Computer         Provider: Irine Seal       Contact: [phone]

## 2021-01-15 ENCOUNTER — Other Ambulatory Visit (HOSPITAL_COMMUNITY): Payer: Self-pay

## 2021-01-15 DIAGNOSIS — Z7901 Long term (current) use of anticoagulants: Secondary | ICD-10-CM | POA: Diagnosis not present

## 2021-01-21 ENCOUNTER — Other Ambulatory Visit: Payer: Self-pay | Admitting: Student

## 2021-01-22 ENCOUNTER — Ambulatory Visit (HOSPITAL_COMMUNITY): Payer: PPO

## 2021-01-22 ENCOUNTER — Ambulatory Visit (HOSPITAL_COMMUNITY)
Admission: RE | Admit: 2021-01-22 | Discharge: 2021-01-22 | Disposition: A | Discharge status: Home / Self Care | Payer: PPO | Source: Ambulatory Visit | Attending: Urology | Admitting: Urology

## 2021-01-22 ENCOUNTER — Other Ambulatory Visit: Payer: Self-pay | Admitting: Radiology

## 2021-01-22 ENCOUNTER — Other Ambulatory Visit: Payer: Self-pay

## 2021-01-22 DIAGNOSIS — Z87820 Personal history of traumatic brain injury: Secondary | ICD-10-CM | POA: Insufficient documentation

## 2021-01-22 DIAGNOSIS — R1909 Other intra-abdominal and pelvic swelling, mass and lump: Secondary | ICD-10-CM | POA: Diagnosis not present

## 2021-01-22 DIAGNOSIS — Z87891 Personal history of nicotine dependence: Secondary | ICD-10-CM | POA: Diagnosis not present

## 2021-01-22 DIAGNOSIS — N200 Calculus of kidney: Secondary | ICD-10-CM | POA: Insufficient documentation

## 2021-01-22 DIAGNOSIS — M329 Systemic lupus erythematosus, unspecified: Secondary | ICD-10-CM | POA: Diagnosis not present

## 2021-01-22 DIAGNOSIS — Z86718 Personal history of other venous thrombosis and embolism: Secondary | ICD-10-CM | POA: Diagnosis not present

## 2021-01-22 DIAGNOSIS — R590 Localized enlarged lymph nodes: Secondary | ICD-10-CM | POA: Diagnosis not present

## 2021-01-22 DIAGNOSIS — C641 Malignant neoplasm of right kidney, except renal pelvis: Secondary | ICD-10-CM | POA: Insufficient documentation

## 2021-01-22 DIAGNOSIS — N2889 Other specified disorders of kidney and ureter: Secondary | ICD-10-CM

## 2021-01-22 DIAGNOSIS — Z7901 Long term (current) use of anticoagulants: Secondary | ICD-10-CM | POA: Insufficient documentation

## 2021-01-22 DIAGNOSIS — R935 Abnormal findings on diagnostic imaging of other abdominal regions, including retroperitoneum: Secondary | ICD-10-CM | POA: Insufficient documentation

## 2021-01-22 DIAGNOSIS — C649 Malignant neoplasm of unspecified kidney, except renal pelvis: Secondary | ICD-10-CM | POA: Diagnosis not present

## 2021-01-22 LAB — CBC
HCT: 45.3 % (ref 39.0–52.0)
Hemoglobin: 15.2 g/dL (ref 13.0–17.0)
MCH: 27.7 pg (ref 26.0–34.0)
MCHC: 33.6 g/dL (ref 30.0–36.0)
MCV: 82.7 fL (ref 80.0–100.0)
Platelets: 151 10*3/uL (ref 150–400)
RBC: 5.48 MIL/uL (ref 4.22–5.81)
RDW: 14.8 % (ref 11.5–15.5)
WBC: 7.3 10*3/uL (ref 4.0–10.5)
nRBC: 0 % (ref 0.0–0.2)

## 2021-01-22 LAB — PROTIME-INR
INR: 1 (ref 0.8–1.2)
Prothrombin Time: 13.2 seconds (ref 11.4–15.2)

## 2021-01-22 MED ORDER — LIDOCAINE HCL 1 % IJ SOLN
INTRAMUSCULAR | Status: AC
  Filled 2021-01-22: qty 10

## 2021-01-22 MED ORDER — MIDAZOLAM HCL 2 MG/2ML IJ SOLN
INTRAMUSCULAR | Status: AC | PRN
  Administered 2021-01-22: 1 mg via INTRAVENOUS

## 2021-01-22 MED ORDER — HYDROCODONE-ACETAMINOPHEN 5-325 MG PO TABS
1.0000 | ORAL_TABLET | ORAL | Status: DC | PRN
  Filled 2021-01-22: qty 2

## 2021-01-22 MED ORDER — MIDAZOLAM HCL 2 MG/2ML IJ SOLN
INTRAMUSCULAR | Status: AC
  Filled 2021-01-22: qty 4

## 2021-01-22 MED ORDER — SODIUM CHLORIDE 0.9 % IV SOLN: INTRAVENOUS | Status: DC

## 2021-01-22 MED ORDER — FENTANYL CITRATE (PF) 100 MCG/2ML IJ SOLN
INTRAMUSCULAR | Status: AC | PRN
Start: 2021-01-22 — End: 2021-01-22
  Administered 2021-01-22: 50 ug via INTRAVENOUS

## 2021-01-22 MED ORDER — FENTANYL CITRATE (PF) 100 MCG/2ML IJ SOLN
INTRAMUSCULAR | Status: AC
  Filled 2021-01-22: qty 2

## 2021-01-22 NOTE — Procedures (Signed)
Interventional Radiology Procedure:   Indications: Retroperitoneal mass    Procedure: CT guided biopsy   Findings: Core biopsies obtained from right retroperitoneal mass  Complications: No immediate complications noted.     EBL: Minimal  Plan: Bedrest 2 hours.  May resume coumadin on 01/23/21.   Hamlet Lasecki R. Anselm Pancoast, MD  Pager: [phone]
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
## 2021-01-24 LAB — SURGICAL PATHOLOGY

## 2021-01-25 ENCOUNTER — Telehealth: Payer: Self-pay | Admitting: Oncology

## 2021-01-25 NOTE — Telephone Encounter (Signed)
Scheduled appt per 1/20 referral. Spoke to pt's wife who is aware of appt date and time. She is aware pt needs to arrive 15 mins prior to appt time.

## 2021-01-29 ENCOUNTER — Other Ambulatory Visit (HOSPITAL_COMMUNITY): Payer: Self-pay

## 2021-01-29 DIAGNOSIS — F330 Major depressive disorder, recurrent, mild: Secondary | ICD-10-CM | POA: Diagnosis not present

## 2021-01-29 MED ORDER — ARIPIPRAZOLE 5 MG PO TABS
ORAL_TABLET | ORAL | 4 refills | Status: DC
  Filled 2021-01-29: qty 90, 90d supply, fill #0

## 2021-01-29 MED ORDER — BUPROPION HCL ER (XL) 300 MG PO TB24
ORAL_TABLET | ORAL | 2 refills | Status: DC
  Filled 2021-01-29: qty 90, 90d supply, fill #0

## 2021-01-31 ENCOUNTER — Other Ambulatory Visit: Payer: Self-pay

## 2021-01-31 ENCOUNTER — Inpatient Hospital Stay: Payer: PPO | Attending: Oncology | Admitting: Oncology

## 2021-01-31 ENCOUNTER — Other Ambulatory Visit (HOSPITAL_COMMUNITY): Payer: Self-pay

## 2021-01-31 VITALS — BP 135/73 | HR 65 | Temp 97.3°F | Resp 20 | Wt 190.7 lb

## 2021-01-31 DIAGNOSIS — C641 Malignant neoplasm of right kidney, except renal pelvis: Secondary | ICD-10-CM

## 2021-01-31 DIAGNOSIS — C649 Malignant neoplasm of unspecified kidney, except renal pelvis: Secondary | ICD-10-CM

## 2021-01-31 MED ORDER — PROCHLORPERAZINE MALEATE 10 MG PO TABS
10.0000 mg | ORAL_TABLET | Freq: Four times a day (QID) | ORAL | 0 refills | Status: DC | PRN
  Filled 2021-01-31 (×2): qty 30, 8d supply, fill #0

## 2021-01-31 NOTE — Progress Notes (Signed)
START ON PATHWAY REGIMEN - Renal Cell     A cycle is every 21 days:     Nivolumab      Ipilimumab    A cycle is every 28 days:     Nivolumab   **Always confirm dose/schedule in your pharmacy ordering system**  Patient Characteristics: Stage IV (Unresected T4M0 or Any T, M1)/Metastatic Disease, Clear Cell, First Line, Intermediate or Poor Risk Therapeutic Status: Stage IV (Unresected T4M0 or Any T, M1)/Metastatic Disease Histology: Clear Cell Line of Therapy: First Line Risk Status: Intermediate Risk Intent of Therapy: Non-Curative / Palliative Intent, Discussed with Patient
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
## 2021-02-01 ENCOUNTER — Telehealth: Payer: Self-pay | Admitting: Oncology

## 2021-02-01 NOTE — Telephone Encounter (Signed)
Scheduled per 01/26 los, patient has been called and notified.

## 2021-02-05 ENCOUNTER — Inpatient Hospital Stay: Payer: PPO

## 2021-02-05 ENCOUNTER — Other Ambulatory Visit: Payer: Self-pay

## 2021-02-05 ENCOUNTER — Encounter: Payer: Self-pay | Admitting: Oncology

## 2021-02-05 NOTE — Progress Notes (Signed)
Met with patient and accompanying adult at registration to introduce myself as Financial Resource Specialist and to offer available resources.  Discussed one-time $1000 [employer] grant and qualifications to assist with personal expenses while going through treatment.  Gave my card if interested in applying and for any additional financial questions or concerns. 

## 2021-02-06 ENCOUNTER — Ambulatory Visit (HOSPITAL_COMMUNITY)
Admission: RE | Admit: 2021-02-06 | Discharge: 2021-02-06 | Disposition: A | Discharge status: Home / Self Care | Payer: PPO | Source: Ambulatory Visit | Attending: Oncology | Admitting: Oncology

## 2021-02-06 DIAGNOSIS — N200 Calculus of kidney: Secondary | ICD-10-CM | POA: Insufficient documentation

## 2021-02-06 DIAGNOSIS — I700 Atherosclerosis of aorta: Secondary | ICD-10-CM | POA: Insufficient documentation

## 2021-02-06 DIAGNOSIS — C641 Malignant neoplasm of right kidney, except renal pelvis: Secondary | ICD-10-CM | POA: Insufficient documentation

## 2021-02-06 DIAGNOSIS — C649 Malignant neoplasm of unspecified kidney, except renal pelvis: Secondary | ICD-10-CM

## 2021-02-06 DIAGNOSIS — K7460 Unspecified cirrhosis of liver: Secondary | ICD-10-CM | POA: Diagnosis not present

## 2021-02-06 DIAGNOSIS — E890 Postprocedural hypothyroidism: Secondary | ICD-10-CM | POA: Diagnosis not present

## 2021-02-06 LAB — GLUCOSE, CAPILLARY: Glucose-Capillary: 100 mg/dL — ABNORMAL HIGH (ref 70–99)

## 2021-02-06 MED ORDER — FLUDEOXYGLUCOSE F - 18 (FDG) INJECTION
9.5000 | Freq: Once | INTRAVENOUS | Status: AC
  Administered 2021-02-06: 9.8 via INTRAVENOUS

## 2021-02-08 ENCOUNTER — Inpatient Hospital Stay: Payer: PPO

## 2021-02-08 ENCOUNTER — Other Ambulatory Visit: Payer: Self-pay

## 2021-02-08 ENCOUNTER — Inpatient Hospital Stay: Payer: PPO | Attending: Oncology

## 2021-02-08 VITALS — BP 140/82 | HR 83 | Resp 18 | Wt 192.8 lb

## 2021-02-08 DIAGNOSIS — C641 Malignant neoplasm of right kidney, except renal pelvis: Secondary | ICD-10-CM

## 2021-02-08 DIAGNOSIS — C7940 Secondary malignant neoplasm of unspecified part of nervous system: Secondary | ICD-10-CM | POA: Insufficient documentation

## 2021-02-08 DIAGNOSIS — C649 Malignant neoplasm of unspecified kidney, except renal pelvis: Secondary | ICD-10-CM

## 2021-02-08 DIAGNOSIS — Z79899 Other long term (current) drug therapy: Secondary | ICD-10-CM | POA: Diagnosis not present

## 2021-02-08 DIAGNOSIS — Z5112 Encounter for antineoplastic immunotherapy: Secondary | ICD-10-CM | POA: Insufficient documentation

## 2021-02-08 DIAGNOSIS — R0683 Snoring: Secondary | ICD-10-CM | POA: Diagnosis not present

## 2021-02-08 DIAGNOSIS — G4733 Obstructive sleep apnea (adult) (pediatric): Secondary | ICD-10-CM | POA: Diagnosis not present

## 2021-02-08 LAB — CBC WITH DIFFERENTIAL (CANCER CENTER ONLY)
Abs Immature Granulocytes: 0.02 10*3/uL (ref 0.00–0.07)
Basophils Absolute: 0.1 10*3/uL (ref 0.0–0.1)
Basophils Relative: 1 %
Eosinophils Absolute: 0.3 10*3/uL (ref 0.0–0.5)
Eosinophils Relative: 4 %
HCT: 45.8 % (ref 39.0–52.0)
Hemoglobin: 14.9 g/dL (ref 13.0–17.0)
Immature Granulocytes: 0 %
Lymphocytes Relative: 20 %
Lymphs Abs: 1.4 10*3/uL (ref 0.7–4.0)
MCH: 27.3 pg (ref 26.0–34.0)
MCHC: 32.5 g/dL (ref 30.0–36.0)
MCV: 84 fL (ref 80.0–100.0)
Monocytes Absolute: 0.9 10*3/uL (ref 0.1–1.0)
Monocytes Relative: 12 %
Neutro Abs: 4.3 10*3/uL (ref 1.7–7.7)
Neutrophils Relative %: 63 %
Platelet Count: 145 10*3/uL — ABNORMAL LOW (ref 150–400)
RBC: 5.45 MIL/uL (ref 4.22–5.81)
RDW: 15 % (ref 11.5–15.5)
WBC Count: 7.1 10*3/uL (ref 4.0–10.5)
nRBC: 0 % (ref 0.0–0.2)

## 2021-02-08 LAB — CMP (CANCER CENTER ONLY)
ALT: 33 U/L (ref 0–44)
AST: 27 U/L (ref 15–41)
Albumin: 3.7 g/dL (ref 3.5–5.0)
Alkaline Phosphatase: 83 U/L (ref 38–126)
Anion gap: 5 (ref 5–15)
BUN: 16 mg/dL (ref 8–23)
CO2: 27 mmol/L (ref 22–32)
Calcium: 9.1 mg/dL (ref 8.9–10.3)
Chloride: 110 mmol/L (ref 98–111)
Creatinine: 1.1 mg/dL (ref 0.61–1.24)
GFR, Estimated: >60 mL/min (ref >60)
Glucose, Bld: 87 mg/dL (ref 70–99)
Potassium: 4 mmol/L (ref 3.5–5.1)
Sodium: 142 mmol/L (ref 135–145)
Total Bilirubin: 0.5 mg/dL (ref 0.3–1.2)
Total Protein: 6.2 g/dL — ABNORMAL LOW (ref 6.5–8.1)

## 2021-02-08 LAB — TSH: TSH: 1.422 u[IU]/mL (ref 0.320–4.118)

## 2021-02-08 MED ORDER — SODIUM CHLORIDE 0.9 % IV SOLN: Freq: Once | INTRAVENOUS | Status: AC

## 2021-02-08 MED ORDER — DIPHENHYDRAMINE HCL 50 MG/ML IJ SOLN
25.0000 mg | Freq: Once | INTRAMUSCULAR | Status: AC
  Administered 2021-02-08: 25 mg via INTRAVENOUS
  Filled 2021-02-08: qty 1

## 2021-02-08 MED ORDER — FAMOTIDINE IN NACL 20-0.9 MG/50ML-% IV SOLN
20.0000 mg | Freq: Once | INTRAVENOUS | Status: AC
  Administered 2021-02-08: 20 mg via INTRAVENOUS
  Filled 2021-02-08: qty 50

## 2021-02-08 MED ORDER — SODIUM CHLORIDE 0.9 % IV SOLN
1.0000 mg/kg | Freq: Once | INTRAVENOUS | Status: AC
  Administered 2021-02-08: 85 mg via INTRAVENOUS
  Filled 2021-02-08: qty 17

## 2021-02-08 MED ORDER — SODIUM CHLORIDE 0.9 % IV SOLN
2.7800 mg/kg | Freq: Once | INTRAVENOUS | Status: AC
  Administered 2021-02-08: 240 mg via INTRAVENOUS
  Filled 2021-02-08: qty 24

## 2021-02-08 NOTE — Patient Instructions (Signed)
[facility]  Discharge Instructions: Thank you for choosing [facility] to provide your oncology and hematology care.   If you have a lab appointment with the [facility], please go directly to the [facility] and check in at the registration area.   Wear comfortable clothing and clothing appropriate for easy access to any Portacath or PICC line.   We strive to give you quality time with your provider. You may need to reschedule your appointment if you arrive late (15 or more minutes).  Arriving late affects you and other patients whose appointments are after yours.  Also, if you miss three or more appointments without notifying the office, you may be dismissed from the clinic at the providers discretion.      For prescription refill requests, have your pharmacy contact our office and allow 72 hours for refills to be completed.    Today you received the following chemotherapy and/or immunotherapy agents: Opdivo and yervoy    To help prevent nausea and vomiting after your treatment, we encourage you to take your nausea medication as directed.  BELOW ARE SYMPTOMS THAT SHOULD BE REPORTED IMMEDIATELY: *FEVER GREATER THAN 100.4 F (38 C) OR HIGHER *CHILLS OR SWEATING *NAUSEA AND VOMITING THAT IS NOT CONTROLLED WITH YOUR NAUSEA MEDICATION *UNUSUAL SHORTNESS OF BREATH *UNUSUAL BRUISING OR BLEEDING *URINARY PROBLEMS (pain or burning when urinating, or frequent urination) *BOWEL PROBLEMS (unusual diarrhea, constipation, pain near the anus) TENDERNESS IN MOUTH AND THROAT WITH OR WITHOUT PRESENCE OF ULCERS (sore throat, sores in mouth, or a toothache) UNUSUAL RASH, SWELLING OR PAIN  UNUSUAL VAGINAL DISCHARGE OR ITCHING   Items with * indicate a potential emergency and should be followed up as soon as possible or go to the Emergency Department if any problems should occur.  Please show the CHEMOTHERAPY ALERT CARD or IMMUNOTHERAPY ALERT CARD at  check-in to the Emergency Department and triage nurse.  Should you have questions after your visit or need to cancel or reschedule your appointment, please contact [facility]  Dept: [phone]  and follow the prompts.  Office hours are 8:00 a.m. to 4:30 p.m. Monday - Friday. Please note that voicemails left after 4:00 p.m. may not be returned until the following business day.  We are closed weekends and major holidays. You have access to a nurse at all times for urgent questions. Please call the main number to the clinic Dept: [phone] and follow the prompts.   For any non-urgent questions, you may also contact your provider using MyChart. We now offer e-Visits for anyone 42 and older to request care online for non-urgent symptoms. For details visit [email or web address].   Also download the MyChart app! Go to the app store, search "MyChart", open the app, select , and log in with your MyChart username and password.  Due to Covid, a mask is required upon entering the hospital/clinic. If you do not have a mask, one will be given to you upon arrival. For doctor visits, patients may have 1 support person aged 51 or older with them. For treatment visits, patients cannot have anyone with them due to current Covid guidelines and our immunocompromised population.

## 2021-02-11 ENCOUNTER — Telehealth: Payer: Self-pay | Admitting: *Deleted

## 2021-02-11 NOTE — Telephone Encounter (Signed)
Called & spoke to pt's wife since pt was in BR dressing.  She reports that pt did well after his recent treatment & denies any problems/concerns.  She was pleased that she was able to go back with pt.  She reports knowing how to reach [place] with any concerns & expressed appreciation for call.

## 2021-02-11 NOTE — Telephone Encounter (Signed)
-----   Message from Anastasia Pall, RN sent at 02/08/2021  3:33 PM EST ----- Regarding: 1st time opdivo/yervoy follow-ip, being seen by Dr. Alen Blew Patient received 1st opdivo and yervoy infusion today. Tolerated treatment well. Being seen by Dr. Alen Blew, please follow up. Thank you.

## 2021-02-12 ENCOUNTER — Ambulatory Visit (HOSPITAL_COMMUNITY)
Admission: RE | Admit: 2021-02-12 | Discharge: 2021-02-12 | Disposition: A | Discharge status: Home / Self Care | Payer: PPO | Source: Ambulatory Visit | Attending: Oncology | Admitting: Oncology

## 2021-02-12 ENCOUNTER — Other Ambulatory Visit: Payer: Self-pay

## 2021-02-12 DIAGNOSIS — G319 Degenerative disease of nervous system, unspecified: Secondary | ICD-10-CM | POA: Diagnosis not present

## 2021-02-12 DIAGNOSIS — C649 Malignant neoplasm of unspecified kidney, except renal pelvis: Secondary | ICD-10-CM

## 2021-02-12 DIAGNOSIS — Z7901 Long term (current) use of anticoagulants: Secondary | ICD-10-CM | POA: Diagnosis not present

## 2021-02-12 MED ORDER — GADOBUTROL 1 MMOL/ML IV SOLN
10.0000 mL | Freq: Once | INTRAVENOUS | Status: AC | PRN
  Administered 2021-02-12: 10 mL via INTRAVENOUS

## 2021-02-18 ENCOUNTER — Other Ambulatory Visit (HOSPITAL_COMMUNITY): Payer: Self-pay

## 2021-02-18 DIAGNOSIS — C778 Secondary and unspecified malignant neoplasm of lymph nodes of multiple regions: Secondary | ICD-10-CM | POA: Diagnosis not present

## 2021-02-18 DIAGNOSIS — N200 Calculus of kidney: Secondary | ICD-10-CM | POA: Diagnosis not present

## 2021-02-18 DIAGNOSIS — C641 Malignant neoplasm of right kidney, except renal pelvis: Secondary | ICD-10-CM | POA: Diagnosis not present

## 2021-02-26 ENCOUNTER — Other Ambulatory Visit (HOSPITAL_COMMUNITY): Payer: Self-pay

## 2021-03-01 ENCOUNTER — Inpatient Hospital Stay: Payer: PPO | Admitting: Oncology

## 2021-03-01 ENCOUNTER — Inpatient Hospital Stay: Payer: PPO

## 2021-03-01 ENCOUNTER — Other Ambulatory Visit: Payer: Self-pay

## 2021-03-01 VITALS — BP 124/69 | HR 72 | Temp 98.1°F | Resp 18 | Ht 69.0 in | Wt 190.9 lb

## 2021-03-01 DIAGNOSIS — C641 Malignant neoplasm of right kidney, except renal pelvis: Secondary | ICD-10-CM

## 2021-03-01 DIAGNOSIS — Z5112 Encounter for antineoplastic immunotherapy: Secondary | ICD-10-CM | POA: Diagnosis not present

## 2021-03-01 DIAGNOSIS — C649 Malignant neoplasm of unspecified kidney, except renal pelvis: Secondary | ICD-10-CM

## 2021-03-01 LAB — CBC WITH DIFFERENTIAL (CANCER CENTER ONLY)
Abs Immature Granulocytes: 0.01 10*3/uL (ref 0.00–0.07)
Basophils Absolute: 0 10*3/uL (ref 0.0–0.1)
Basophils Relative: 1 %
Eosinophils Absolute: 0 10*3/uL (ref 0.0–0.5)
Eosinophils Relative: 0 %
HCT: 44.3 % (ref 39.0–52.0)
Hemoglobin: 14.7 g/dL (ref 13.0–17.0)
Immature Granulocytes: 0 %
Lymphocytes Relative: 23 %
Lymphs Abs: 1.4 10*3/uL (ref 0.7–4.0)
MCH: 27.2 pg (ref 26.0–34.0)
MCHC: 33.2 g/dL (ref 30.0–36.0)
MCV: 81.9 fL (ref 80.0–100.0)
Monocytes Absolute: 0.9 10*3/uL (ref 0.1–1.0)
Monocytes Relative: 14 %
Neutro Abs: 3.8 10*3/uL (ref 1.7–7.7)
Neutrophils Relative %: 62 %
Platelet Count: 143 10*3/uL — ABNORMAL LOW (ref 150–400)
RBC: 5.41 MIL/uL (ref 4.22–5.81)
RDW: 15.7 % — ABNORMAL HIGH (ref 11.5–15.5)
WBC Count: 6.2 10*3/uL (ref 4.0–10.5)
nRBC: 0 % (ref 0.0–0.2)

## 2021-03-01 LAB — CMP (CANCER CENTER ONLY)
ALT: 37 U/L (ref 0–44)
AST: 30 U/L (ref 15–41)
Albumin: 3.6 g/dL (ref 3.5–5.0)
Alkaline Phosphatase: 103 U/L (ref 38–126)
Anion gap: 6 (ref 5–15)
BUN: 16 mg/dL (ref 8–23)
CO2: 24 mmol/L (ref 22–32)
Calcium: 8.8 mg/dL — ABNORMAL LOW (ref 8.9–10.3)
Chloride: 111 mmol/L (ref 98–111)
Creatinine: 1.13 mg/dL (ref 0.61–1.24)
GFR, Estimated: >60 mL/min (ref >60)
Glucose, Bld: 101 mg/dL — ABNORMAL HIGH (ref 70–99)
Potassium: 3.8 mmol/L (ref 3.5–5.1)
Sodium: 141 mmol/L (ref 135–145)
Total Bilirubin: 0.7 mg/dL (ref 0.3–1.2)
Total Protein: 6.1 g/dL — ABNORMAL LOW (ref 6.5–8.1)

## 2021-03-01 LAB — TSH: TSH: 1.932 u[IU]/mL (ref 0.320–4.118)

## 2021-03-01 MED ORDER — DIPHENHYDRAMINE HCL 50 MG/ML IJ SOLN
INTRAMUSCULAR | Status: AC
  Filled 2021-03-01: qty 1

## 2021-03-01 MED ORDER — SODIUM CHLORIDE 0.9 % IV SOLN
2.7800 mg/kg | Freq: Once | INTRAVENOUS | Status: AC
  Administered 2021-03-01: 240 mg via INTRAVENOUS
  Filled 2021-03-01: qty 24

## 2021-03-01 MED ORDER — SODIUM CHLORIDE 0.9 % IV SOLN: Freq: Once | INTRAVENOUS | Status: AC

## 2021-03-01 MED ORDER — DIPHENHYDRAMINE HCL 50 MG/ML IJ SOLN
25.0000 mg | Freq: Once | INTRAMUSCULAR | Status: AC
  Administered 2021-03-01: 25 mg via INTRAVENOUS
  Filled 2021-03-01: qty 1

## 2021-03-01 MED ORDER — SODIUM CHLORIDE 0.9 % IV SOLN
1.0000 mg/kg | Freq: Once | INTRAVENOUS | Status: AC
  Administered 2021-03-01: 85 mg via INTRAVENOUS
  Filled 2021-03-01: qty 17

## 2021-03-01 MED ORDER — FAMOTIDINE IN NACL 20-0.9 MG/50ML-% IV SOLN
20.0000 mg | Freq: Once | INTRAVENOUS | Status: AC
  Administered 2021-03-01: 20 mg via INTRAVENOUS
  Filled 2021-03-01: qty 50

## 2021-03-01 NOTE — Progress Notes (Signed)

## 2021-03-01 NOTE — Progress Notes (Signed)
Hematology and Oncology Follow Up Visit  Tyler Mason [id number] [birth year] 71 y.o. 03/01/2021 11:19 AM Tyler Mason, MDHusain, Tyler Ar, MD   Principle Diagnosis: 71 year old with stage IV kidney cancer diagnosed in January 2023.  He was found to have a 2.2 cm upper pole of the kidney with retroperitoneal lymphadenopathy.  He has intermediate risk disease.   Prior Therapy:  She is status post biopsy completed on January 22, 2021 of the retroperitoneal lymph node.  Current therapy:  Ipilimumab 1 mg/kg with nivolumab 240 mg total dose started on February 08, 2021.  He is here for cycle 2 of therapy.  Interim History: [name] returns today for a follow-up visit.  Since the last visit, he completed the first cycle of combination immunotherapy without any complications.  He denies any nausea, fatigue or skin rash.  He denies any diarrhea or dyspnea on exertion.  He denies any hospitalizations or illnesses.  His performance status is reasonable and unchanged.  He does ambulate without any major difficulties and falls.  He does have Parkinson's disease which affects his memory.     Medications: I have reviewed the patient's current medications.  Current Outpatient Medications  Medication Sig Dispense Refill   acetaminophen (TYLENOL) 325 MG tablet Take 2 tablets (650 mg total) by mouth every 4 (four) hours as needed for mild pain.     ARIPiprazole (ABILIFY) 5 MG tablet TAKE 1 TABLET BY MOUTH ONCE DAILY IN THE MORNING. 90 tablet 4   buPROPion (WELLBUTRIN XL) 300 MG 24 hr tablet Take 1 tablet (300 mg total) by mouth every morning. 90 tablet 2   donepezil (ARICEPT) 5 MG tablet Take 1/2 tablet daily for 2 weeks, then increase to 1 tablet daily 30 tablet 11   ibuprofen (ADVIL) 400 MG tablet Take 1 tablet by mouth every 4  hours as needed for moderate pain. 30 tablet 0   levothyroxine (SYNTHROID) 75 MCG tablet Take 1 tablet (75 mcg total) by mouth every morning on an empty stomach 90 tablet 3    Melatonin 5 MG CAPS Take by mouth at bedtime.     pantoprazole (PROTONIX) 40 MG tablet Take 1 tablet (40 mg total) by mouth daily. 90 tablet 2   prochlorperazine (COMPAZINE) 10 MG tablet Take 1 tablet (10 mg total) by mouth every 6 (six) hours as needed for nausea or vomiting. 30 tablet 0   simvastatin (ZOCOR) 20 MG tablet TAKE 1 TABLET BY MOUTH ONCE DAILY 90 tablet 3   vitamin B-12 (CYANOCOBALAMIN) 1000 MCG tablet Take 1,000 mcg by mouth daily.     warfarin (COUMADIN) 5 MG tablet Take 1 and 1/2 tablets by mouth on Friday and Sunday. Take 1 tablet by mouth on all other days. 96 tablet 3   No current facility-administered medications for this visit.     Allergies:  Allergies  Allergen Reactions   Ampicillin Other (See Comments)    Made tongue sore Has patient had a PCN reaction causing immediate rash, facial/tongue/throat swelling, SOB or lightheadedness with hypotension: no Has patient had a PCN reaction causing severe rash involving mucus membranes or skin necrosis: no Has patient had a PCN reaction that required hospitalization no Has patient had a PCN reaction occurring within the last 10 years: no If all of the above answers are "NO", then may proceed with Cephalosporin use.    Macrodantin [Nitrofurantoin Macrocrystal] Itching, Swelling and Rash   Donepezil Other (See Comments)     Physical Exam: Blood pressure 124/69, pulse 72, temperature  98.1 F (36.7 C), temperature source Temporal, resp. rate 18, height 5\' 9"  (1.753 m), weight 190 lb 14.4 oz (86.6 kg).  ECOG: 1   General appearance: Comfortable appearing without any discomfort Head: Normocephalic without any trauma Oropharynx: Mucous membranes are moist and pink without any thrush or ulcers. Eyes: Pupils are equal and round reactive to light. Lymph nodes: No cervical, supraclavicular, inguinal or axillary lymphadenopathy.   Heart:regular rate and rhythm.  S1 and S2 without leg edema. Lung: Clear without any rhonchi or  wheezes.  No dullness to percussion. Abdomin: Soft, nontender, nondistended with good bowel sounds.  No hepatosplenomegaly. Musculoskeletal: No joint deformity or effusion.  Full range of motion noted. Neurological: No deficits noted on motor, sensory and deep tendon reflex exam. Skin: No petechial rash or dryness.  Appeared moist.     Lab Results: Lab Results  Component Value Date   WBC 7.1 02/08/2021   HGB 14.9 02/08/2021   HCT 45.8 02/08/2021   MCV 84.0 02/08/2021   PLT 145 (L) 02/08/2021     Chemistry      Component Value Date/Time   NA 142 02/08/2021 1212   K 4.0 02/08/2021 1212   CL 110 02/08/2021 1212   CO2 27 02/08/2021 1212   BUN 16 02/08/2021 1212   CREATININE 1.10 02/08/2021 1212      Component Value Date/Time   CALCIUM 9.1 02/08/2021 1212   ALKPHOS 83 02/08/2021 1212   AST 27 02/08/2021 1212   ALT 33 02/08/2021 1212   BILITOT 0.5 02/08/2021 1212      IMPRESSION: 1. Hypermetabolic left suprahilar and retrocaval adenopathy, indicative of metastatic disease in this patient with a right renal cell carcinoma. Retrocaval adenopathy has decreased in size from 12/27/2020. 2. Hypermetabolism in the inferior prostate. Consider laboratory correlation, as clinically indicated. 3. Cirrhosis. 4. Bilateral renal stones. 5.  Aortic atherosclerosis (ICD10-I70.0).   IMPRESSION: 1. Stable atrophy and white matter disease. This likely reflects the sequela of chronic microvascular ischemia. 2. No acute intracranial abnormality or significant interval change.    Impression and Plan:  71 year old with:  1.  Stage IV renal neoplasm diagnosed in January 2023.  He was found to have a right kidney mass and retroperitoneal adenopathy that is biopsy-proven.  He has intermediate risk disease.    He is currently receiving immunotherapy for treatment and completed the first cycle of therapy.  Risks and benefits of proceeding with cycle 2 of therapy were discussed.   Complications that include nausea, fatigue and immune mediated issues were reiterated.  PET scan obtained on February 06, 2021 did not show any evidence of widespread disease.   He is agreeable to proceed and we will update his staging scans after the fourth cycle of treatment.   2.  IV access: No issues reported with peripheral veins.  Port-A-Cath option will be deferred.   3.  Antiemetics: Compazine is available to him.  No nausea or vomiting at this time.   4.  Immune mediated complications: I continue to educate him about potential complications including pneumonitis, colitis and thyroid disease.   5.  Recurrent venous thromboembolism: He remains on warfarin without any complications.   6.  CNS metastasis surveillance: MRI of the brain on February 12, 2021 showed no evidence of metastatic disease.   7.  Follow-up: In 3 weeks for the next cycle of therapy.   30  minutes were spent on this encounter.  The time was dedicated to reviewing laboratory data, disease status update, outlining future  plan of care discussion.       Zola Button, MD 2/24/202311:19 AM

## 2021-03-01 NOTE — Patient Instructions (Signed)
[facility]   Discharge Instructions: Thank you for choosing [facility] to provide your oncology and hematology care.   If you have a lab appointment with the Cancer Center, please go directly to the Cancer Center and check in at the registration area.   Wear comfortable clothing and clothing appropriate for easy access to any Portacath or PICC line.   We strive to give you quality time with your provider. You may need to reschedule your appointment if you arrive late (15 or more minutes).  Arriving late affects you and other patients whose appointments are after yours.  Also, if you miss three or more appointments without notifying the office, you may be dismissed from the clinic at the provider's discretion.      For prescription refill requests, have your pharmacy contact our office and allow 72 hours for refills to be completed.    Today you received the following chemotherapy and/or immunotherapy agents: Nivolumab (Opdivo) and Ipilimumab (Yervoy).      To help prevent nausea and vomiting after your treatment, we encourage you to take your nausea medication as directed.  BELOW ARE SYMPTOMS THAT SHOULD BE REPORTED IMMEDIATELY: *FEVER GREATER THAN 100.4 F (38 C) OR HIGHER *CHILLS OR SWEATING *NAUSEA AND VOMITING THAT IS NOT CONTROLLED WITH YOUR NAUSEA MEDICATION *UNUSUAL SHORTNESS OF BREATH *UNUSUAL BRUISING OR BLEEDING *URINARY PROBLEMS (pain or burning when urinating, or frequent urination) *BOWEL PROBLEMS (unusual diarrhea, constipation, pain near the anus) TENDERNESS IN MOUTH AND THROAT WITH OR WITHOUT PRESENCE OF ULCERS (sore throat, sores in mouth, or a toothache) UNUSUAL RASH, SWELLING OR PAIN  UNUSUAL VAGINAL DISCHARGE OR ITCHING   Items with * indicate a potential emergency and should be followed up as soon as possible or go to the Emergency Department if any problems should occur.  Please show the CHEMOTHERAPY ALERT CARD or  IMMUNOTHERAPY ALERT CARD at check-in to the Emergency Department and triage nurse.  Should you have questions after your visit or need to cancel or reschedule your appointment, please contact [facility]  Dept: [phone]  and follow the prompts.  Office hours are 8:00 a.m. to 4:30 p.m. Monday - Friday. Please note that voicemails left after 4:00 p.m. may not be returned until the following business day.  We are closed weekends and major holidays. You have access to a nurse at all times for urgent questions. Please call the main number to the clinic Dept: [phone] and follow the prompts.   For any non-urgent questions, you may also contact your provider using MyChart. We now offer e-Visits for anyone 18 and older to request care online for non-urgent symptoms. For details visit [email or web address].   Also download the MyChart app! Go to the app store, search "MyChart", open the app, select [facility], and log in with your MyChart username and password.  Due to Covid, a mask is required upon entering the hospital/clinic. If you do not have a mask, one will be given to you upon arrival. For doctor visits, patients may have 1 support person aged 18 or older with them. For treatment visits, patients cannot have anyone with them due to current Covid guidelines and our immunocompromised population.   

## 2021-03-04 ENCOUNTER — Other Ambulatory Visit (HOSPITAL_COMMUNITY): Payer: Self-pay

## 2021-03-08 DIAGNOSIS — R0683 Snoring: Secondary | ICD-10-CM | POA: Diagnosis not present

## 2021-03-08 DIAGNOSIS — G4733 Obstructive sleep apnea (adult) (pediatric): Secondary | ICD-10-CM | POA: Diagnosis not present

## 2021-03-10 ENCOUNTER — Other Ambulatory Visit: Payer: Self-pay

## 2021-03-11 ENCOUNTER — Other Ambulatory Visit (HOSPITAL_COMMUNITY): Payer: Self-pay

## 2021-03-11 MED ORDER — SIMVASTATIN 20 MG PO TABS
ORAL_TABLET | ORAL | 4 refills | Status: DC
  Filled 2021-03-11: qty 90, 90d supply, fill #0
  Filled 2021-06-09: qty 90, 90d supply, fill #1

## 2021-03-12 DIAGNOSIS — Z7901 Long term (current) use of anticoagulants: Secondary | ICD-10-CM | POA: Diagnosis not present

## 2021-03-14 ENCOUNTER — Other Ambulatory Visit (HOSPITAL_COMMUNITY): Payer: Self-pay

## 2021-03-20 ENCOUNTER — Other Ambulatory Visit (HOSPITAL_COMMUNITY): Payer: Self-pay

## 2021-03-22 ENCOUNTER — Other Ambulatory Visit: Payer: Self-pay

## 2021-03-22 ENCOUNTER — Inpatient Hospital Stay: Payer: PPO | Attending: Oncology

## 2021-03-22 ENCOUNTER — Inpatient Hospital Stay: Payer: PPO | Admitting: Oncology

## 2021-03-22 ENCOUNTER — Inpatient Hospital Stay: Payer: PPO

## 2021-03-22 VITALS — BP 128/70 | HR 87 | Temp 98.1°F | Resp 19 | Ht 69.0 in | Wt 188.2 lb

## 2021-03-22 DIAGNOSIS — Z79899 Other long term (current) drug therapy: Secondary | ICD-10-CM | POA: Diagnosis not present

## 2021-03-22 DIAGNOSIS — C641 Malignant neoplasm of right kidney, except renal pelvis: Secondary | ICD-10-CM | POA: Diagnosis not present

## 2021-03-22 DIAGNOSIS — Z7901 Long term (current) use of anticoagulants: Secondary | ICD-10-CM | POA: Diagnosis not present

## 2021-03-22 DIAGNOSIS — Z86718 Personal history of other venous thrombosis and embolism: Secondary | ICD-10-CM | POA: Diagnosis not present

## 2021-03-22 DIAGNOSIS — C7940 Secondary malignant neoplasm of unspecified part of nervous system: Secondary | ICD-10-CM | POA: Diagnosis not present

## 2021-03-22 DIAGNOSIS — Z5112 Encounter for antineoplastic immunotherapy: Secondary | ICD-10-CM | POA: Insufficient documentation

## 2021-03-22 DIAGNOSIS — C649 Malignant neoplasm of unspecified kidney, except renal pelvis: Secondary | ICD-10-CM

## 2021-03-22 LAB — TSH: TSH: 2.1 u[IU]/mL (ref 0.320–4.118)

## 2021-03-22 LAB — CBC WITH DIFFERENTIAL (CANCER CENTER ONLY)
Abs Immature Granulocytes: 0.01 10*3/uL (ref 0.00–0.07)
Basophils Absolute: 0 10*3/uL (ref 0.0–0.1)
Basophils Relative: 0 %
Eosinophils Absolute: 0 10*3/uL (ref 0.0–0.5)
Eosinophils Relative: 0 %
HCT: 46.9 % (ref 39.0–52.0)
Hemoglobin: 15.6 g/dL (ref 13.0–17.0)
Immature Granulocytes: 0 %
Lymphocytes Relative: 22 %
Lymphs Abs: 1.6 10*3/uL (ref 0.7–4.0)
MCH: 27.3 pg (ref 26.0–34.0)
MCHC: 33.3 g/dL (ref 30.0–36.0)
MCV: 82 fL (ref 80.0–100.0)
Monocytes Absolute: 1 10*3/uL (ref 0.1–1.0)
Monocytes Relative: 13 %
Neutro Abs: 4.9 10*3/uL (ref 1.7–7.7)
Neutrophils Relative %: 65 %
Platelet Count: 163 10*3/uL (ref 150–400)
RBC: 5.72 MIL/uL (ref 4.22–5.81)
RDW: 15 % (ref 11.5–15.5)
WBC Count: 7.5 10*3/uL (ref 4.0–10.5)
nRBC: 0 % (ref 0.0–0.2)

## 2021-03-22 LAB — CMP (CANCER CENTER ONLY)
ALT: 28 U/L (ref 0–44)
AST: 29 U/L (ref 15–41)
Albumin: 3.8 g/dL (ref 3.5–5.0)
Alkaline Phosphatase: 88 U/L (ref 38–126)
Anion gap: 7 (ref 5–15)
BUN: 19 mg/dL (ref 8–23)
CO2: 24 mmol/L (ref 22–32)
Calcium: 9.1 mg/dL (ref 8.9–10.3)
Chloride: 109 mmol/L (ref 98–111)
Creatinine: 1.23 mg/dL (ref 0.61–1.24)
GFR, Estimated: >60 mL/min (ref >60)
Glucose, Bld: 109 mg/dL — ABNORMAL HIGH (ref 70–99)
Potassium: 4 mmol/L (ref 3.5–5.1)
Sodium: 140 mmol/L (ref 135–145)
Total Bilirubin: 0.6 mg/dL (ref 0.3–1.2)
Total Protein: 6.6 g/dL (ref 6.5–8.1)

## 2021-03-22 MED ORDER — FAMOTIDINE IN NACL 20-0.9 MG/50ML-% IV SOLN
20.0000 mg | Freq: Once | INTRAVENOUS | Status: AC
  Administered 2021-03-22: 20 mg via INTRAVENOUS
  Filled 2021-03-22: qty 50

## 2021-03-22 MED ORDER — SODIUM CHLORIDE 0.9 % IV SOLN
240.0000 mg | Freq: Once | INTRAVENOUS | Status: AC
  Administered 2021-03-22: 240 mg via INTRAVENOUS
  Filled 2021-03-22: qty 24

## 2021-03-22 MED ORDER — SODIUM CHLORIDE 0.9 % IV SOLN: Freq: Once | INTRAVENOUS | Status: AC

## 2021-03-22 MED ORDER — SODIUM CHLORIDE 0.9 % IV SOLN: 3.0000 mg/kg | Freq: Once | INTRAVENOUS | Status: DC

## 2021-03-22 MED ORDER — DIPHENHYDRAMINE HCL 50 MG/ML IJ SOLN
25.0000 mg | Freq: Once | INTRAMUSCULAR | Status: AC
  Administered 2021-03-22: 25 mg via INTRAVENOUS
  Filled 2021-03-22: qty 1

## 2021-03-22 MED ORDER — SODIUM CHLORIDE 0.9 % IV SOLN
1.0000 mg/kg | Freq: Once | INTRAVENOUS | Status: AC
  Administered 2021-03-22: 85 mg via INTRAVENOUS
  Filled 2021-03-22: qty 17

## 2021-03-22 NOTE — Patient Instructions (Signed)
[facility]  Discharge Instructions: ?Thank you for choosing [facility] to provide your oncology and hematology care.  ? ?If you have a lab appointment with the [facility], please go directly to the [facility] and check in at the registration area. ?  ?Wear comfortable clothing and clothing appropriate for easy access to any Portacath or PICC line.  ? ?We strive to give you quality time with your provider. You may need to reschedule your appointment if you arrive late (15 or more minutes).  Arriving late affects you and other patients whose appointments are after yours.  Also, if you miss three or more appointments without notifying the office, you may be dismissed from the clinic at the provider?s discretion.    ?  ?For prescription refill requests, have your pharmacy contact our office and allow 72 hours for refills to be completed.   ? ?Today you received the following chemotherapy and/or immunotherapy agents: Nivolumab, Yervoy.    ?  ?To help prevent nausea and vomiting after your treatment, we encourage you to take your nausea medication as directed. ? ?BELOW ARE SYMPTOMS THAT SHOULD BE REPORTED IMMEDIATELY: ?*FEVER GREATER THAN 100.4 F (38 ?C) OR HIGHER ?*CHILLS OR SWEATING ?*NAUSEA AND VOMITING THAT IS NOT CONTROLLED WITH YOUR NAUSEA MEDICATION ?*UNUSUAL SHORTNESS OF BREATH ?*UNUSUAL BRUISING OR BLEEDING ?*URINARY PROBLEMS (pain or burning when urinating, or frequent urination) ?*BOWEL PROBLEMS (unusual diarrhea, constipation, pain near the anus) ?TENDERNESS IN MOUTH AND THROAT WITH OR WITHOUT PRESENCE OF ULCERS (sore throat, sores in mouth, or a toothache) ?UNUSUAL RASH, SWELLING OR PAIN  ?UNUSUAL VAGINAL DISCHARGE OR ITCHING  ? ?Items with * indicate a potential emergency and should be followed up as soon as possible or go to the Emergency Department if any problems should occur. ? ?Please show the CHEMOTHERAPY ALERT CARD or IMMUNOTHERAPY ALERT CARD at  check-in to the Emergency Department and triage nurse. ? ?Should you have questions after your visit or need to cancel or reschedule your appointment, please contact [facility]  Dept: [phone]  and follow the prompts.  Office hours are 8:00 a.m. to 4:30 p.m. Monday - Friday. Please note that voicemails left after 4:00 p.m. may not be returned until the following business day.  We are closed weekends and major holidays. You have access to a nurse at all times for urgent questions. Please call the main number to the clinic Dept: [phone] and follow the prompts. ? ? ?For any non-urgent questions, you may also contact your provider using MyChart. We now offer e-Visits for anyone 70 and older to request care online for non-urgent symptoms. For details visit [email or web address]. ?  ?Also download the MyChart app! Go to the app store, search "MyChart", open the app, select [facility], and log in with your MyChart username and password. ? ?Due to Covid, a mask is required upon entering the hospital/clinic. If you do not have a mask, one will be given to you upon arrival. For doctor visits, patients may have 1 support person aged 70 or older with them. For treatment visits, patients cannot have anyone with them due to current Covid guidelines and our immunocompromised population.  ? ?

## 2021-03-22 NOTE — Progress Notes (Signed)
Ipilimumab (YERVOY) Patient Monitoring Assessment  ? ?Is the patient experiencing any of the following general symptoms?:  ?'[]'$ Difficulty performing normal activities ?'[]'$ Feeling sluggish or cold all the time ?'[]'$ Unusual weight gain ?'[]'$ Constant or unusual headaches ?'[]'$ Feeling dizzy or faint ?'[]'$ Changes in eyesight (blurry vision, double vision, or other vision problems) ?'[]'$ Changes in mood or behavior (ex: decreased sex drive, irritability, or forgetfulness) ?'[]'$ Starting new medications (ex: steroids, other medications that lower immune response) ?'[x]'$ Patient is not experiencing any of the general symptoms above.  ? ? ?Gastrointestinal  ?Patient is having 3 bowel movements each day.  ?Is this different from baseline? '[]'$ Yes '[x]'$ No ?Are your stools watery or do they have a foul smell? '[]'$ Yes '[x]'$ No ?Have you seen blood in your stools? '[]'$ Yes '[x]'$ No ?Are your stools dark, tarry, or sticky? '[]'$ Yes '[]'$ No ?Are you having pain or tenderness in your belly? '[]'$ Yes '[x]'$ No ? ?Skin ?Does your skin itch? '[]'$ Yes '[x]'$ No ?Do you have a rash? '[]'$ Yes '[x]'$ No ?Has your skin blistered and/or peeled? '[]'$ Yes '[x]'$ No ?Do you have sores in your mouth? '[]'$ Yes '[x]'$ No ? ?Hepatic ?Has your urine been dark or tea colored? '[]'$ Yes '[x]'$ No ?Have you noticed that your skin or the whites of your eyes are turning yellow? '[]'$ Yes '[x]'$ No ?Are you bleeding or bruising more easily than normal? '[]'$ Yes '[x]'$ No ?Are you nauseous and/or vomiting? '[]'$ Yes '[x]'$ No ?Do you have pain on the right side of your stomach? '[]'$ Yes '[x]'$ No ? ?Neurologic  ?Are you having unusual weakness of legs, arms, or face? '[]'$ Yes '[x]'$ No ?Are you having numbness or tingling in your hands or feet? '[]'$ Yes '[x]'$ No ? ?Tyler Mason  ?

## 2021-03-22 NOTE — Progress Notes (Signed)
Hematology and Oncology Follow Up Visit ? ?Tyler Mason ?[id number] ?1950/04/28 71 y.o. ?03/22/2021 11:43 AM ?[name], MDHusain, [name], MD  ? ?Principle Diagnosis: 71 year old with kidney cancer diagnosed in 2023.  He was found to have stage IV disease with retroperitoneal lymphadenopathy and a kidney mass. ? ? ?Prior Therapy: ? ?She is status post biopsy completed on January 22, 2021 of the retroperitoneal lymph node. ? ?Current therapy: ? ?Ipilimumab 1 mg/kg with nivolumab 240 mg total dose started on February 08, 2021.  He is here for cycle 3 of therapy. ? ?Interim History: Tyler Mason is here for a follow-up visit.  Since the last visit, he had completed cycle 2 of therapy without any major complications.  He denies any nausea, vomiting or abdominal pain.  Denies any diarrhea, fatigue or skin rash.  Performance status quality of life remains unchanged. ? ? ? ?Medications: Updated on review. ?Current Outpatient Medications  ?Medication Sig Dispense Refill  ? acetaminophen (TYLENOL) 325 MG tablet Take 2 tablets (650 mg total) by mouth every 4 (four) hours as needed for mild pain.    ? ARIPiprazole (ABILIFY) 5 MG tablet TAKE 1 TABLET BY MOUTH ONCE DAILY IN THE MORNING. 90 tablet 4  ? buPROPion (WELLBUTRIN XL) 300 MG 24 hr tablet Take 1 tablet (300 mg total) by mouth every morning. 90 tablet 2  ? donepezil (ARICEPT) 5 MG tablet Take 1/2 tablet daily for 2 weeks, then increase to 1 tablet daily 30 tablet 11  ? ibuprofen (ADVIL) 400 MG tablet Take 1 tablet by mouth every 4  hours as needed for moderate pain. 30 tablet 0  ? levothyroxine (SYNTHROID) 75 MCG tablet Take 1 tablet (75 mcg total) by mouth every morning on an empty stomach 90 tablet 3  ? Melatonin 5 MG CAPS Take by mouth at bedtime.    ? pantoprazole (PROTONIX) 40 MG tablet Take 1 tablet (40 mg total) by mouth daily. 90 tablet 2  ? prochlorperazine (COMPAZINE) 10 MG tablet Take 1 tablet (10 mg total) by mouth every 6 (six) hours as needed for nausea or  vomiting. 30 tablet 0  ? simvastatin (ZOCOR) 20 MG tablet TAKE 1 TABLET BY MOUTH ONCE DAILY 90 tablet 3  ? simvastatin (ZOCOR) 20 MG tablet TAKE 1 TABLET BY MOUTH ONCE DAILY 90 tablet 4  ? vitamin B-12 (CYANOCOBALAMIN) 1000 MCG tablet Take 1,000 mcg by mouth daily.    ? warfarin (COUMADIN) 5 MG tablet Take 1 and 1/2 tablets by mouth on Friday and Sunday. Take 1 tablet by mouth on all other days. 96 tablet 3  ? ?No current facility-administered medications for this visit.  ? ? ? ?Allergies:  ?Allergies  ?Allergen Reactions  ? Ampicillin Other (See Comments)  ?  Made tongue sore ?Has patient had a PCN reaction causing immediate rash, facial/tongue/throat swelling, SOB or lightheadedness with hypotension: no ?Has patient had a PCN reaction causing severe rash involving mucus membranes or skin necrosis: no ?Has patient had a PCN reaction that required hospitalization no ?Has patient had a PCN reaction occurring within the last 10 years: no ?If all of the above answers are "NO", then may proceed with Cephalosporin use. ?  ? Macrodantin [Nitrofurantoin Macrocrystal] Itching, Swelling and Rash  ? ? ? ?Physical Exam: ?Blood pressure 128/70, pulse 87, temperature 98.1 ?F (36.7 ?C), temperature source Temporal, resp. rate 19, height '5\' 9"'$  (1.753 m), weight 188 lb 3.2 oz (85.4 kg), SpO2 99 %. ? ?ECOG: 1 ? ? ? ?General appearance:  Alert, awake without any distress. ?Head: Atraumatic without abnormalities ?Oropharynx: Without any thrush or ulcers. ?Eyes: No scleral icterus. ?Lymph nodes: No lymphadenopathy noted in the cervical, supraclavicular, or axillary nodes ?Heart:regular rate and rhythm, without any murmurs or gallops.   ?Lung: Clear to auscultation without any rhonchi, wheezes or dullness to percussion. ?Abdomin: Soft, nontender without any shifting dullness or ascites. ?Musculoskeletal: No clubbing or cyanosis. ?Neurological: No motor or sensory deficits. ?Skin: No rashes or lesions. ? ? ? ?Lab Results: ?Lab Results   ?Component Value Date  ? WBC 7.5 03/22/2021  ? HGB 15.6 03/22/2021  ? HCT 46.9 03/22/2021  ? MCV 82.0 03/22/2021  ? PLT 163 03/22/2021  ? ?  Chemistry   ?   ?Component Value Date/Time  ? NA 141 03/01/2021 1110  ? K 3.8 03/01/2021 1110  ? CL 111 03/01/2021 1110  ? CO2 24 03/01/2021 1110  ? BUN 16 03/01/2021 1110  ? CREATININE 1.13 03/01/2021 1110  ?    ?Component Value Date/Time  ? CALCIUM 8.8 (L) 03/01/2021 1110  ? ALKPHOS 103 03/01/2021 1110  ? AST 30 03/01/2021 1110  ? ALT 37 03/01/2021 1110  ? BILITOT 0.7 03/01/2021 1110  ?  ? ? ? ? ?Impression and Plan: ? ?71 year old with: ? ?1.  Right kidney neoplasm noted in 2023.  He was found to have stage IV disease with adenopathy. ? ? ?He is currently on combination immunotherapy with excellent tolerance so far.  Risks and benefits of continuing this treatment to conclude after 4 cycles were reviewed.  Complications that include nausea, fatigue and immune mediated issues were reiterated.  Plan is to update staging scans after cycle 4 of therapy. ?  ?2.  IV access: Peripheral veins are currently in use without any complications.  Already cath option has been deferred. ?  ?3.  Antiemetics: No nausea or vomiting reported at this time.  Compazine is available to him. ? ? ?4.  Immune mediated complications: He has not had any complications including pneumonitis, colitis and thyroid disease. ?  ?5.  Recurrent venous thromboembolism: No bleeding or thrombosis noted at this time.  He continues to be on warfarin. ?  ?6.  CNS metastasis surveillance: This will be updated annually unless symptoms develop.  Last imaging study was in March 2023. ?  ?7.  Follow-up: He will return in 3 weeks for cycle 4 of therapy. ?  ?30  minutes were dedicated to this visit.  The time was spent on reviewing laboratory data, disease status update and outlining plan of care. ?  ?  ? ? ?Zola Button, MD ?3/17/202311:43 AM ? ?

## 2021-03-26 ENCOUNTER — Other Ambulatory Visit (HOSPITAL_COMMUNITY): Payer: Self-pay | Admitting: Psychiatry

## 2021-03-27 ENCOUNTER — Other Ambulatory Visit (HOSPITAL_COMMUNITY): Payer: Self-pay

## 2021-03-28 ENCOUNTER — Other Ambulatory Visit (HOSPITAL_COMMUNITY): Payer: Self-pay

## 2021-03-28 DIAGNOSIS — F330 Major depressive disorder, recurrent, mild: Secondary | ICD-10-CM | POA: Diagnosis not present

## 2021-03-28 DIAGNOSIS — F0632 Mood disorder due to known physiological condition with major depressive-like episode: Secondary | ICD-10-CM | POA: Diagnosis not present

## 2021-03-28 MED ORDER — BUPROPION HCL ER (XL) 300 MG PO TB24
ORAL_TABLET | ORAL | 2 refills | Status: DC
  Filled 2021-03-28: qty 90, 90d supply, fill #0

## 2021-03-28 MED ORDER — ARIPIPRAZOLE 5 MG PO TABS
ORAL_TABLET | ORAL | 4 refills | Status: DC
  Filled 2021-03-28: qty 90, 90d supply, fill #0
  Filled 2021-07-01: qty 90, 90d supply, fill #1
  Filled 2021-07-13: qty 90, 90d supply, fill #2

## 2021-04-08 ENCOUNTER — Other Ambulatory Visit (HOSPITAL_COMMUNITY): Payer: Self-pay

## 2021-04-08 DIAGNOSIS — R0683 Snoring: Secondary | ICD-10-CM | POA: Diagnosis not present

## 2021-04-08 DIAGNOSIS — G4733 Obstructive sleep apnea (adult) (pediatric): Secondary | ICD-10-CM | POA: Diagnosis not present

## 2021-04-10 DIAGNOSIS — Z7901 Long term (current) use of anticoagulants: Secondary | ICD-10-CM | POA: Diagnosis not present

## 2021-04-12 ENCOUNTER — Inpatient Hospital Stay (HOSPITAL_BASED_OUTPATIENT_CLINIC_OR_DEPARTMENT_OTHER): Payer: PPO | Admitting: Oncology

## 2021-04-12 ENCOUNTER — Other Ambulatory Visit: Payer: Self-pay

## 2021-04-12 ENCOUNTER — Inpatient Hospital Stay: Payer: PPO

## 2021-04-12 ENCOUNTER — Inpatient Hospital Stay: Payer: PPO | Attending: Oncology

## 2021-04-12 VITALS — BP 138/74 | HR 73 | Temp 97.7°F | Resp 20 | Wt 188.6 lb

## 2021-04-12 DIAGNOSIS — C641 Malignant neoplasm of right kidney, except renal pelvis: Secondary | ICD-10-CM | POA: Insufficient documentation

## 2021-04-12 DIAGNOSIS — C772 Secondary and unspecified malignant neoplasm of intra-abdominal lymph nodes: Secondary | ICD-10-CM | POA: Insufficient documentation

## 2021-04-12 DIAGNOSIS — Z5112 Encounter for antineoplastic immunotherapy: Secondary | ICD-10-CM | POA: Insufficient documentation

## 2021-04-12 DIAGNOSIS — Z79899 Other long term (current) drug therapy: Secondary | ICD-10-CM | POA: Insufficient documentation

## 2021-04-12 DIAGNOSIS — C649 Malignant neoplasm of unspecified kidney, except renal pelvis: Secondary | ICD-10-CM

## 2021-04-12 DIAGNOSIS — Z7901 Long term (current) use of anticoagulants: Secondary | ICD-10-CM | POA: Diagnosis not present

## 2021-04-12 LAB — CMP (CANCER CENTER ONLY)
ALT: 29 U/L (ref 0–44)
AST: 36 U/L (ref 15–41)
Albumin: 3.5 g/dL (ref 3.5–5.0)
Alkaline Phosphatase: 76 U/L (ref 38–126)
Anion gap: 6 (ref 5–15)
BUN: 17 mg/dL (ref 8–23)
CO2: 24 mmol/L (ref 22–32)
Calcium: 8.9 mg/dL (ref 8.9–10.3)
Chloride: 109 mmol/L (ref 98–111)
Creatinine: 1.15 mg/dL (ref 0.61–1.24)
GFR, Estimated: >60 mL/min (ref >60)
Glucose, Bld: 133 mg/dL — ABNORMAL HIGH (ref 70–99)
Potassium: 3.9 mmol/L (ref 3.5–5.1)
Sodium: 139 mmol/L (ref 135–145)
Total Bilirubin: 0.5 mg/dL (ref 0.3–1.2)
Total Protein: 6.1 g/dL — ABNORMAL LOW (ref 6.5–8.1)

## 2021-04-12 LAB — CBC WITH DIFFERENTIAL (CANCER CENTER ONLY)
Abs Immature Granulocytes: 0.01 10*3/uL (ref 0.00–0.07)
Basophils Absolute: 0 10*3/uL (ref 0.0–0.1)
Basophils Relative: 0 %
Eosinophils Absolute: 0 10*3/uL (ref 0.0–0.5)
Eosinophils Relative: 0 %
HCT: 45.2 % (ref 39.0–52.0)
Hemoglobin: 15.1 g/dL (ref 13.0–17.0)
Immature Granulocytes: 0 %
Lymphocytes Relative: 19 %
Lymphs Abs: 1.2 10*3/uL (ref 0.7–4.0)
MCH: 27.4 pg (ref 26.0–34.0)
MCHC: 33.4 g/dL (ref 30.0–36.0)
MCV: 81.9 fL (ref 80.0–100.0)
Monocytes Absolute: 0.8 10*3/uL (ref 0.1–1.0)
Monocytes Relative: 12 %
Neutro Abs: 4.4 10*3/uL (ref 1.7–7.7)
Neutrophils Relative %: 69 %
Platelet Count: 137 10*3/uL — ABNORMAL LOW (ref 150–400)
RBC: 5.52 MIL/uL (ref 4.22–5.81)
RDW: 14.6 % (ref 11.5–15.5)
WBC Count: 6.3 10*3/uL (ref 4.0–10.5)
nRBC: 0 % (ref 0.0–0.2)

## 2021-04-12 LAB — TSH: TSH: 1.91 u[IU]/mL (ref 0.320–4.118)

## 2021-04-12 MED ORDER — SODIUM CHLORIDE 0.9 % IV SOLN: Freq: Once | INTRAVENOUS | Status: AC

## 2021-04-12 MED ORDER — SODIUM CHLORIDE 0.9 % IV SOLN
2.7700 mg/kg | Freq: Once | INTRAVENOUS | Status: AC
  Administered 2021-04-12: 240 mg via INTRAVENOUS
  Filled 2021-04-12: qty 24

## 2021-04-12 MED ORDER — SODIUM CHLORIDE 0.9 % IV SOLN
1.0000 mg/kg | Freq: Once | INTRAVENOUS | Status: AC
  Administered 2021-04-12: 85 mg via INTRAVENOUS
  Filled 2021-04-12: qty 17

## 2021-04-12 MED ORDER — SODIUM CHLORIDE 0.9 % IV SOLN: 3.0000 mg/kg | Freq: Once | INTRAVENOUS | Status: DC

## 2021-04-12 MED ORDER — DIPHENHYDRAMINE HCL 50 MG/ML IJ SOLN
25.0000 mg | Freq: Once | INTRAMUSCULAR | Status: AC
  Administered 2021-04-12: 25 mg via INTRAVENOUS
  Filled 2021-04-12: qty 1

## 2021-04-12 MED ORDER — FAMOTIDINE IN NACL 20-0.9 MG/50ML-% IV SOLN
20.0000 mg | Freq: Once | INTRAVENOUS | Status: AC
  Administered 2021-04-12: 20 mg via INTRAVENOUS
  Filled 2021-04-12: qty 50

## 2021-04-12 NOTE — Patient Instructions (Signed)
[facility]  Discharge Instructions: ?Thank you for choosing [facility] to provide your oncology and hematology care.  ? ?If you have a lab appointment with the [facility], please go directly to the [facility] and check in at the registration area. ?  ?Wear comfortable clothing and clothing appropriate for easy access to any Portacath or PICC line.  ? ?We strive to give you quality time with your provider. You may need to reschedule your appointment if you arrive late (15 or more minutes).  Arriving late affects you and other patients whose appointments are after yours.  Also, if you miss three or more appointments without notifying the office, you may be dismissed from the clinic at the provider?s discretion.    ?  ?For prescription refill requests, have your pharmacy contact our office and allow 72 hours for refills to be completed.   ? ?Today you received the following chemotherapy and/or immunotherapy agents: Nivolumab, Yervoy.    ?  ?To help prevent nausea and vomiting after your treatment, we encourage you to take your nausea medication as directed. ? ?BELOW ARE SYMPTOMS THAT SHOULD BE REPORTED IMMEDIATELY: ?*FEVER GREATER THAN 100.4 F (38 ?C) OR HIGHER ?*CHILLS OR SWEATING ?*NAUSEA AND VOMITING THAT IS NOT CONTROLLED WITH YOUR NAUSEA MEDICATION ?*UNUSUAL SHORTNESS OF BREATH ?*UNUSUAL BRUISING OR BLEEDING ?*URINARY PROBLEMS (pain or burning when urinating, or frequent urination) ?*BOWEL PROBLEMS (unusual diarrhea, constipation, pain near the anus) ?TENDERNESS IN MOUTH AND THROAT WITH OR WITHOUT PRESENCE OF ULCERS (sore throat, sores in mouth, or a toothache) ?UNUSUAL RASH, SWELLING OR PAIN  ?UNUSUAL VAGINAL DISCHARGE OR ITCHING  ? ?Items with * indicate a potential emergency and should be followed up as soon as possible or go to the Emergency Department if any problems should occur. ? ?Please show the CHEMOTHERAPY ALERT CARD or IMMUNOTHERAPY ALERT CARD at  check-in to the Emergency Department and triage nurse. ? ?Should you have questions after your visit or need to cancel or reschedule your appointment, please contact [facility]  Dept: [phone]  and follow the prompts.  Office hours are 8:00 a.m. to 4:30 p.m. Monday - Friday. Please note that voicemails left after 4:00 p.m. may not be returned until the following business day.  We are closed weekends and major holidays. You have access to a nurse at all times for urgent questions. Please call the main number to the clinic Dept: [phone] and follow the prompts. ? ? ?For any non-urgent questions, you may also contact your provider using MyChart. We now offer e-Visits for anyone 65 and older to request care online for non-urgent symptoms. For details visit [email or web address]. ?  ?Also download the MyChart app! Go to the app store, search "MyChart", open the app, select [facility], and log in with your MyChart username and password. ? ?Due to Covid, a mask is required upon entering the hospital/clinic. If you do not have a mask, one will be given to you upon arrival. For doctor visits, patients may have 1 support Cheo Selvey aged 70 or older with them. For treatment visits, patients cannot have anyone with them due to current Covid guidelines and our immunocompromised population.  ? ?

## 2021-04-12 NOTE — Progress Notes (Signed)
Ipilimumab (YERVOY) Patient Monitoring Assessment  ? ?Is the patient experiencing any of the following general symptoms?: None ?'[ ]'$ Difficulty performing normal activities ?'[ ]'$ Feeling sluggish or cold all the time ?'[ ]'$ Unusual weight gain ?'[ ]'$ Constant or unusual headaches ?'[ ]'$ Feeling dizzy or faint ?'[ ]'$ Changes in eyesight (blurry vision, double vision, or other vision problems) ?'[ ]'$ Changes in mood or behavior (ex: decreased sex drive, irritability, or forgetfulness) ?'[ ]'$ Starting new medications (ex: steroids, other medications that lower immune response) ?'[ ]'$ Patient is not experiencing any of the general symptoms above.  ? ?Gastrointestinal  ?Patient is having 2-3 bowel movements each day.  ?Is this different from baseline? '[ ]'$ Yes [ x]No ?Are your stools watery or do they have a foul smell? '[ ]'$ Yes [x ]No ?Have you seen blood in your stools? '[ ]'$ Yes [x ]No ?Are your stools dark, tarry, or sticky? '[ ]'$ Yes [x ]No ?Are you having pain or tenderness in your belly? '[ ]'$ Yes        [ x]No ? ?Skin ?Does your skin itch? '[ ]'$ Yes [x ]No ?Do you have a rash? '[ ]'$ Yes [x ]No ?Has your skin blistered and/or peeled? '[ ]'$ Yes [x ]No ?Do you have sores in your mouth? '[ ]'$ Yes [x ]No ? ?Hepatic ?Has your urine been dark or tea colored? '[ ]'$ Yes [x ]No ?Have you noticed that your skin or the whites of your eyes are turning yellow? '[ ]'$ Yes [x ]No ?Are you bleeding or bruising more easily than normal? '[ ]'$ Yes [x ]No ?Are you nauseous and/or vomiting? '[ ]'$ Yes [x ]No ?Do you have pain on the right side of your stomach? '[ ]'$ Yes [x ]No ? ?Neurologic  ?Are you having unusual weakness of legs, arms, or face? '[ ]'$ Yes [x ]No ?Are you having numbness or tingling in your hands or feet? '[ ]'$ Yes [x ]No ? ?Larae Caison A [facility] ? ? ?

## 2021-04-12 NOTE — Progress Notes (Signed)
Hematology and Oncology Follow Up Visit ? ?Tyler Mason ?[id number] ?08-05-1950 71 y.o. ?04/12/2021 11:39 AM ?[name], MDHusain, [name], MD  ? ?Principle Diagnosis: 71 year old with stage IV kidney cancer presented with retroperitoneal adenopathy diagnosed in 2023.  He was found to have right kidney mass. ? ?Prior Therapy: ? ?She is status post biopsy completed on January 22, 2021 of the retroperitoneal lymph node. ? ?Current therapy: ? ?Ipilimumab 1 mg/kg with nivolumab 240 mg total dose started on February 08, 2021.  He is here for cycle 4 of therapy. ? ?Interim History: Tyler Mason presents today for repeat evaluation.  Since last visit, he reports no major changes in his health.  He has tolerated therapy without any complaints.  He does report some fatigue and tiredness but has not been debilitating.  He is able to perform all activities of daily living without any dramatic changes from his baseline.  He denies any diarrhea, shortness of breath or skin rash. ? ? ? ?Medications: Reviewed without changes. ?Current Outpatient Medications  ?Medication Sig Dispense Refill  ? acetaminophen (TYLENOL) 325 MG tablet Take 2 tablets (650 mg total) by mouth every 4 (four) hours as needed for mild pain.    ? ARIPiprazole (ABILIFY) 5 MG tablet TAKE 1 TABLET BY MOUTH ONCE DAILY IN THE MORNING. 90 tablet 4  ? ARIPiprazole (ABILIFY) 5 MG tablet Take 1 tablet by mouth every morning 90 tablet 4  ? buPROPion (WELLBUTRIN XL) 300 MG 24 hr tablet Take 1 tablet (300 mg total) by mouth every morning. 90 tablet 2  ? buPROPion (WELLBUTRIN XL) 300 MG 24 hr tablet Take 1 tablet by mouth every morning 90 tablet 2  ? donepezil (ARICEPT) 5 MG tablet Take 1/2 tablet daily for 2 weeks, then increase to 1 tablet daily 30 tablet 11  ? ibuprofen (ADVIL) 400 MG tablet Take 1 tablet by mouth every 4  hours as needed for moderate pain. 30 tablet 0  ? levothyroxine (SYNTHROID) 75 MCG tablet Take 1 tablet (75 mcg total) by mouth every morning on an  empty stomach 90 tablet 3  ? Melatonin 5 MG CAPS Take by mouth at bedtime.    ? pantoprazole (PROTONIX) 40 MG tablet Take 1 tablet (40 mg total) by mouth daily. 90 tablet 2  ? prochlorperazine (COMPAZINE) 10 MG tablet Take 1 tablet (10 mg total) by mouth every 6 (six) hours as needed for nausea or vomiting. 30 tablet 0  ? simvastatin (ZOCOR) 20 MG tablet TAKE 1 TABLET BY MOUTH ONCE DAILY 90 tablet 3  ? simvastatin (ZOCOR) 20 MG tablet TAKE 1 TABLET BY MOUTH ONCE DAILY 90 tablet 4  ? vitamin B-12 (CYANOCOBALAMIN) 1000 MCG tablet Take 1,000 mcg by mouth daily.    ? warfarin (COUMADIN) 5 MG tablet Take 1 and 1/2 tablets by mouth on Friday and Sunday. Take 1 tablet by mouth on all other days. 96 tablet 3  ? ?No current facility-administered medications for this visit.  ? ? ? ?Allergies:  ?Allergies  ?Allergen Reactions  ? Ampicillin Other (See Comments)  ?  Made tongue sore ?Has patient had a PCN reaction causing immediate rash, facial/tongue/throat swelling, SOB or lightheadedness with hypotension: no ?Has patient had a PCN reaction causing severe rash involving mucus membranes or skin necrosis: no ?Has patient had a PCN reaction that required hospitalization no ?Has patient had a PCN reaction occurring within the last 10 years: no ?If all of the above answers are "NO", then may proceed with Cephalosporin use. ?  ?  Macrodantin [Nitrofurantoin Macrocrystal] Itching, Swelling and Rash  ? ? ? ?Physical Exam: ?Blood pressure 138/74, pulse 73, temperature 97.7 ?F (36.5 ?C), resp. rate 20, weight 188 lb 9.6 oz (85.5 kg), SpO2 98 %. ? ? ?ECOG: 1 ? ? ?General appearance: Comfortable appearing without any discomfort ?Head: Normocephalic without any trauma ?Oropharynx: Mucous membranes are moist and pink without any thrush or ulcers. ?Eyes: Pupils are equal and round reactive to light. ?Lymph nodes: No cervical, supraclavicular, inguinal or axillary lymphadenopathy.   ?Heart:regular rate and rhythm.  S1 and S2 without leg  edema. ?Lung: Clear without any rhonchi or wheezes.  No dullness to percussion. ?Abdomin: Soft, nontender, nondistended with good bowel sounds.  No hepatosplenomegaly. ?Musculoskeletal: No joint deformity or effusion.  Full range of motion noted. ?Neurological: No deficits noted on motor, sensory and deep tendon reflex exam. ?Skin: No petechial rash or dryness.  Appeared moist.  ? ? ? ? ?Lab Results: ?Lab Results  ?Component Value Date  ? WBC 7.5 03/22/2021  ? HGB 15.6 03/22/2021  ? HCT 46.9 03/22/2021  ? MCV 82.0 03/22/2021  ? PLT 163 03/22/2021  ? ?  Chemistry   ?   ?Component Value Date/Time  ? NA 140 03/22/2021 1112  ? K 4.0 03/22/2021 1112  ? CL 109 03/22/2021 1112  ? CO2 24 03/22/2021 1112  ? BUN 19 03/22/2021 1112  ? CREATININE 1.23 03/22/2021 1112  ?    ?Component Value Date/Time  ? CALCIUM 9.1 03/22/2021 1112  ? ALKPHOS 88 03/22/2021 1112  ? AST 29 03/22/2021 1112  ? ALT 28 03/22/2021 1112  ? BILITOT 0.6 03/22/2021 1112  ?  ? ? ? ? ?Impression and Plan: ? ?71 year old with: ? ?1.  Stage IV kidney cancer diagnosed in 2023.  She presented with right kidney mass and retroperitoneal adenopathy that is biopsy-proven.  ? ? ?He has completed 3 cycles of immunotherapy without any major complications.  Risks and benefits of continuing this treatment were discussed.  Complications including GI toxicity as well as autoimmune complications were reiterated.  The plan is to update his staging scan after cycle 4 of therapy.  He is agreeable to proceed.  Tentatively switching to nivolumab maintenance after that will be considered pending the results of the scans. ?  ?2.  IV access: Port-A-Cath option has been deferred.  Peripheral veins are currently in use.   ?  ?3.  Antiemetics: Compazine is available to him without any nausea or vomiting. ? ?4.  Immune mediated complications: I continue to educate him about potential complication clued pneumonitis, colitis and thyroid disease. ?  ?5.  Recurrent venous thromboembolism:  He continues to be on warfarin without any bleeding or thrombosis. ?  ?6.  CNS metastasis surveillance: No evidence of a CNS metastasis and will continue to monitor future. ?  ?7.  Follow-up: He will return in 3 weeks for repeat follow-up. ? ? ?30  minutes were spent on this encounter.  The time was dedicated to reviewing laboratory data, disease status update and outlining future plan of care. ?  ?  ? ? ?Zola Button, MD ?4/7/202311:39 AM ? ?

## 2021-04-16 ENCOUNTER — Other Ambulatory Visit (HOSPITAL_COMMUNITY): Payer: Self-pay

## 2021-04-16 DIAGNOSIS — G4733 Obstructive sleep apnea (adult) (pediatric): Secondary | ICD-10-CM | POA: Diagnosis not present

## 2021-04-26 ENCOUNTER — Ambulatory Visit (HOSPITAL_COMMUNITY)
Admission: RE | Admit: 2021-04-26 | Discharge: 2021-04-26 | Disposition: A | Discharge status: Home / Self Care | Payer: PPO | Source: Ambulatory Visit | Attending: Oncology | Admitting: Oncology

## 2021-04-26 DIAGNOSIS — R918 Other nonspecific abnormal finding of lung field: Secondary | ICD-10-CM | POA: Diagnosis not present

## 2021-04-26 DIAGNOSIS — N281 Cyst of kidney, acquired: Secondary | ICD-10-CM | POA: Diagnosis not present

## 2021-04-26 DIAGNOSIS — C641 Malignant neoplasm of right kidney, except renal pelvis: Secondary | ICD-10-CM | POA: Diagnosis not present

## 2021-04-26 DIAGNOSIS — N200 Calculus of kidney: Secondary | ICD-10-CM | POA: Diagnosis not present

## 2021-04-26 DIAGNOSIS — R161 Splenomegaly, not elsewhere classified: Secondary | ICD-10-CM | POA: Diagnosis not present

## 2021-04-26 DIAGNOSIS — J439 Emphysema, unspecified: Secondary | ICD-10-CM | POA: Diagnosis not present

## 2021-04-26 DIAGNOSIS — K7460 Unspecified cirrhosis of liver: Secondary | ICD-10-CM | POA: Diagnosis not present

## 2021-04-26 MED ORDER — SODIUM CHLORIDE (PF) 0.9 % IJ SOLN
INTRAMUSCULAR | Status: AC
  Filled 2021-04-26: qty 50

## 2021-04-26 MED ORDER — IOHEXOL 300 MG/ML  SOLN
100.0000 mL | Freq: Once | INTRAMUSCULAR | Status: AC | PRN
  Administered 2021-04-26: 100 mL via INTRAVENOUS

## 2021-05-03 ENCOUNTER — Inpatient Hospital Stay (HOSPITAL_BASED_OUTPATIENT_CLINIC_OR_DEPARTMENT_OTHER): Payer: PPO | Admitting: Oncology

## 2021-05-03 ENCOUNTER — Inpatient Hospital Stay: Payer: PPO

## 2021-05-03 ENCOUNTER — Other Ambulatory Visit: Payer: Self-pay

## 2021-05-03 VITALS — BP 132/68 | HR 82 | Temp 98.7°F | Resp 18 | Wt 189.0 lb

## 2021-05-03 DIAGNOSIS — C641 Malignant neoplasm of right kidney, except renal pelvis: Secondary | ICD-10-CM

## 2021-05-03 DIAGNOSIS — Z5112 Encounter for antineoplastic immunotherapy: Secondary | ICD-10-CM | POA: Diagnosis not present

## 2021-05-03 DIAGNOSIS — C649 Malignant neoplasm of unspecified kidney, except renal pelvis: Secondary | ICD-10-CM

## 2021-05-03 LAB — CBC WITH DIFFERENTIAL (CANCER CENTER ONLY)
Abs Immature Granulocytes: 0.02 10*3/uL (ref 0.00–0.07)
Basophils Absolute: 0 10*3/uL (ref 0.0–0.1)
Basophils Relative: 0 %
Eosinophils Absolute: 0 10*3/uL (ref 0.0–0.5)
Eosinophils Relative: 0 %
HCT: 46.7 % (ref 39.0–52.0)
Hemoglobin: 15.8 g/dL (ref 13.0–17.0)
Immature Granulocytes: 0 %
Lymphocytes Relative: 22 %
Lymphs Abs: 1.6 10*3/uL (ref 0.7–4.0)
MCH: 27.3 pg (ref 26.0–34.0)
MCHC: 33.8 g/dL (ref 30.0–36.0)
MCV: 80.7 fL (ref 80.0–100.0)
Monocytes Absolute: 1.1 10*3/uL — ABNORMAL HIGH (ref 0.1–1.0)
Monocytes Relative: 16 %
Neutro Abs: 4.3 10*3/uL (ref 1.7–7.7)
Neutrophils Relative %: 62 %
Platelet Count: 163 10*3/uL (ref 150–400)
RBC: 5.79 MIL/uL (ref 4.22–5.81)
RDW: 14.7 % (ref 11.5–15.5)
WBC Count: 7 10*3/uL (ref 4.0–10.5)
nRBC: 0 % (ref 0.0–0.2)

## 2021-05-03 LAB — CMP (CANCER CENTER ONLY)
ALT: 33 U/L (ref 0–44)
AST: 35 U/L (ref 15–41)
Albumin: 3.6 g/dL (ref 3.5–5.0)
Alkaline Phosphatase: 82 U/L (ref 38–126)
Anion gap: 4 — ABNORMAL LOW (ref 5–15)
BUN: 15 mg/dL (ref 8–23)
CO2: 24 mmol/L (ref 22–32)
Calcium: 8.9 mg/dL (ref 8.9–10.3)
Chloride: 110 mmol/L (ref 98–111)
Creatinine: 1.2 mg/dL (ref 0.61–1.24)
GFR, Estimated: >60 mL/min (ref >60)
Glucose, Bld: 103 mg/dL — ABNORMAL HIGH (ref 70–99)
Potassium: 3.9 mmol/L (ref 3.5–5.1)
Sodium: 138 mmol/L (ref 135–145)
Total Bilirubin: 0.6 mg/dL (ref 0.3–1.2)
Total Protein: 6.1 g/dL — ABNORMAL LOW (ref 6.5–8.1)

## 2021-05-03 LAB — TSH: TSH: 2.728 u[IU]/mL (ref 0.350–4.500)

## 2021-05-03 MED ORDER — SODIUM CHLORIDE 0.9 % IV SOLN: Freq: Once | INTRAVENOUS | Status: AC

## 2021-05-03 MED ORDER — SODIUM CHLORIDE 0.9 % IV SOLN
480.0000 mg | Freq: Once | INTRAVENOUS | Status: AC
  Administered 2021-05-03: 480 mg via INTRAVENOUS
  Filled 2021-05-03: qty 48

## 2021-05-03 NOTE — Progress Notes (Signed)
Hematology and Oncology Follow Up Visit ? ?Tyler Mason ?[id number] ?05/24/1950 71 y.o. ?05/03/2021 8:09 AM ?[name], MDHusain, [name], MD  ? ?Principle Diagnosis: 71 year old man with right kidney mass presented in January 2023.  He was found to have stage IV  with retroperitoneal adenopathy. ? ?Prior Therapy: ? ?He is status post biopsy completed on January 22, 2021 of the retroperitoneal lymph node. ? ?Ipilimumab 1 mg/kg with nivolumab 240 mg total dose started on February 08, 2021.  He completed 4 cycles of therapy in April 2023. ? ?Current therapy: Under evaluation to start nivolumab maintenance 480 mg every 4 weeks. ? ? ? ?Interim History: Tyler Mason returns today for a follow-up visit.  Since last visit, he reports no major changes in his health.  He continues to tolerate immunotherapy without any concerns.  He denies any nausea, vomiting or abdominal pain. ? ? ? ?Medications: Updated on review. ?Current Outpatient Medications  ?Medication Sig Dispense Refill  ? acetaminophen (TYLENOL) 325 MG tablet Take 2 tablets (650 mg total) by mouth every 4 (four) hours as needed for mild pain.    ? ARIPiprazole (ABILIFY) 5 MG tablet TAKE 1 TABLET BY MOUTH ONCE DAILY IN THE MORNING. 90 tablet 4  ? ARIPiprazole (ABILIFY) 5 MG tablet Take 1 tablet by mouth every morning 90 tablet 4  ? buPROPion (WELLBUTRIN XL) 300 MG 24 hr tablet Take 1 tablet (300 mg total) by mouth every morning. 90 tablet 2  ? buPROPion (WELLBUTRIN XL) 300 MG 24 hr tablet Take 1 tablet by mouth every morning 90 tablet 2  ? donepezil (ARICEPT) 5 MG tablet Take 1/2 tablet daily for 2 weeks, then increase to 1 tablet daily 30 tablet 11  ? ibuprofen (ADVIL) 400 MG tablet Take 1 tablet by mouth every 4  hours as needed for moderate pain. 30 tablet 0  ? levothyroxine (SYNTHROID) 75 MCG tablet Take 1 tablet (75 mcg total) by mouth every morning on an empty stomach 90 tablet 3  ? Melatonin 5 MG CAPS Take by mouth at bedtime.    ? pantoprazole (PROTONIX) 40  MG tablet Take 1 tablet (40 mg total) by mouth daily. 90 tablet 2  ? prochlorperazine (COMPAZINE) 10 MG tablet Take 1 tablet (10 mg total) by mouth every 6 (six) hours as needed for nausea or vomiting. 30 tablet 0  ? simvastatin (ZOCOR) 20 MG tablet TAKE 1 TABLET BY MOUTH ONCE DAILY 90 tablet 3  ? simvastatin (ZOCOR) 20 MG tablet TAKE 1 TABLET BY MOUTH ONCE DAILY 90 tablet 4  ? vitamin B-12 (CYANOCOBALAMIN) 1000 MCG tablet Take 1,000 mcg by mouth daily.    ? warfarin (COUMADIN) 5 MG tablet Take 1 and 1/2 tablets by mouth on Friday and Sunday. Take 1 tablet by mouth on all other days. 96 tablet 3  ? ?No current facility-administered medications for this visit.  ? ? ? ?Allergies:  ?Allergies  ?Allergen Reactions  ? Ampicillin Other (See Comments)  ?  Made tongue sore ?Has patient had a PCN reaction causing immediate rash, facial/tongue/throat swelling, SOB or lightheadedness with hypotension: no ?Has patient had a PCN reaction causing severe rash involving mucus membranes or skin necrosis: no ?Has patient had a PCN reaction that required hospitalization no ?Has patient had a PCN reaction occurring within the last 10 years: no ?If all of the above answers are "NO", then may proceed with Cephalosporin use. ?  ? Macrodantin [Nitrofurantoin Macrocrystal] Itching, Swelling and Rash  ? ? ? ?Physical Exam: ? ?  Blood pressure 132/68, pulse 82, temperature 98.7 ?F (37.1 ?C), temperature source Temporal, resp. rate 18, weight 189 lb (85.7 kg), SpO2 97 %. ? ? ?ECOG: 1 ? ? ? ?General appearance: Alert, awake without any distress. ?Head: Atraumatic without abnormalities ?Oropharynx: Without any thrush or ulcers. ?Eyes: No scleral icterus. ?Lymph nodes: No lymphadenopathy noted in the cervical, supraclavicular, or axillary nodes ?Heart:regular rate and rhythm, without any murmurs or gallops.   ?Lung: Clear to auscultation without any rhonchi, wheezes or dullness to percussion. ?Abdomin: Soft, nontender without any shifting dullness  or ascites. ?Musculoskeletal: No clubbing or cyanosis. ?Neurological: No motor or sensory deficits. ?Skin: No rashes or lesions. ? ? ? ? ?Lab Results: ?Lab Results  ?Component Value Date  ? WBC 6.3 04/12/2021  ? HGB 15.1 04/12/2021  ? HCT 45.2 04/12/2021  ? MCV 81.9 04/12/2021  ? PLT 137 (L) 04/12/2021  ? ?  Chemistry   ?   ?Component Value Date/Time  ? NA 139 04/12/2021 1154  ? K 3.9 04/12/2021 1154  ? CL 109 04/12/2021 1154  ? CO2 24 04/12/2021 1154  ? BUN 17 04/12/2021 1154  ? CREATININE 1.15 04/12/2021 1154  ?    ?Component Value Date/Time  ? CALCIUM 8.9 04/12/2021 1154  ? ALKPHOS 76 04/12/2021 1154  ? AST 36 04/12/2021 1154  ? ALT 29 04/12/2021 1154  ? BILITOT 0.5 04/12/2021 1154  ?  ? ?IMPRESSION: ?1. Significant interval decrease in size of the left hilar and ?retroperitoneal lymph nodes suggesting a response to treatment. No ?new or progressive findings. ?2. Slight interval decrease in size of the right upper pole renal ?lesion. ?3. Stable emphysematous changes and pulmonary scarring. ?4. Stable small scattered pulmonary nodules. ?5. Stable cirrhotic changes involving the liver with splenomegaly. ?No ascites. ?  ?* Tracking Code: BO * ?  ?Emphysema (ICD10-J43.9). ? ? ?Impression and Plan: ? ?71 year old with: ? ?1.  Kidney cancer diagnosed in 2023.  He presented with stage IV disease including right kidney mass and retroperitoneal adenopathy. ? ? ?His disease status was updated at this time and treatment choices were reviewed.  CT scan obtained on April 26, 2021 showed a significant response with decrease in the size of his lymphadenopathy as well as a primary kidney tumor.  Risks and benefits of continuing immunotherapy at this time were reviewed.  I have recommended nivolumab maintenance given his excellent response and tolerance to this treatment.  He is agreeable to proceed. ?  ?2.  IV access: No issues reported with peripheral veins at this time.  Port-A-Cath option has been deferred. ?  ?3.   Antiemetics: No nausea or vomiting reported at this time.  Compazine is available to him. ? ?4.  Immune mediated complications: He has not experienced any complications at this time.  These include pneumonitis, colitis and thyroid disease. ?  ?5.  Recurrent venous thromboembolism: He remains on warfarin without any recurrent bleeding or thrombosis. ?  ?6.  CNS metastasis surveillance: MRI of the brain in February 2023 did not show any evidence of metastatic disease. ?  ?7.  Follow-up: In 4 weeks for the next cycle of nivolumab. ? ? ?30  minutes were dedicated to this visit.  The time was spent on reviewing laboratory data, disease status update and outlining future plan of care discussion. ?  ?  ? ? ?Zola Button, MD ?4/28/20238:09 AM ? ?

## 2021-05-03 NOTE — Progress Notes (Signed)
Begin Nivolumab '480mg'$  Q month today. ?Orders changed as requested. ? ?Acquanetta Belling, [place], BCPS, BCOP ?05/03/2021 ?10:12 AM ? ?

## 2021-05-03 NOTE — Progress Notes (Signed)
Per charge nurse OK for Pt's wife to be inf at chairside with Pt.  ?

## 2021-05-03 NOTE — Progress Notes (Signed)
This RN verified with Vergia Alcon, [place] in pharmacy that this Pt does not need premedications with Opdivo today.  ?

## 2021-05-03 NOTE — Patient Instructions (Signed)
[facility]  Discharge Instructions: ?Thank you for choosing [facility] to provide your oncology and hematology care.  ? ?If you have a lab appointment with the Cancer Center, please go directly to the Cancer Center and check in at the registration area. ?  ?Wear comfortable clothing and clothing appropriate for easy access to any Portacath or PICC line.  ? ?We strive to give you quality time with your provider. You may need to reschedule your appointment if you arrive late (15 or more minutes).  Arriving late affects you and other patients whose appointments are after yours.  Also, if you miss three or more appointments without notifying the office, you may be dismissed from the clinic at the provider?s discretion.    ?  ?For prescription refill requests, have your pharmacy contact our office and allow 72 hours for refills to be completed.   ? ?Today you received the following chemotherapy and/or immunotherapy agents Opdivo    ?  ?To help prevent nausea and vomiting after your treatment, we encourage you to take your nausea medication as directed. ? ?BELOW ARE SYMPTOMS THAT SHOULD BE REPORTED IMMEDIATELY: ?*FEVER GREATER THAN 100.4 F (38 ?C) OR HIGHER ?*CHILLS OR SWEATING ?*NAUSEA AND VOMITING THAT IS NOT CONTROLLED WITH YOUR NAUSEA MEDICATION ?*UNUSUAL SHORTNESS OF BREATH ?*UNUSUAL BRUISING OR BLEEDING ?*URINARY PROBLEMS (pain or burning when urinating, or frequent urination) ?*BOWEL PROBLEMS (unusual diarrhea, constipation, pain near the anus) ?TENDERNESS IN MOUTH AND THROAT WITH OR WITHOUT PRESENCE OF ULCERS (sore throat, sores in mouth, or a toothache) ?UNUSUAL RASH, SWELLING OR PAIN  ?UNUSUAL VAGINAL DISCHARGE OR ITCHING  ? ?Items with * indicate a potential emergency and should be followed up as soon as possible or go to the Emergency Department if any problems should occur. ? ?Please show the CHEMOTHERAPY ALERT CARD or IMMUNOTHERAPY ALERT CARD at check-in to the  Emergency Department and triage nurse. ? ?Should you have questions after your visit or need to cancel or reschedule your appointment, please contact [facility]  Dept: [phone]  and follow the prompts.  Office hours are 8:00 a.m. to 4:30 p.m. Monday - Friday. Please note that voicemails left after 4:00 p.m. may not be returned until the following business day.  We are closed weekends and major holidays. You have access to a nurse at all times for urgent questions. Please call the main number to the clinic Dept: [phone] and follow the prompts. ? ? ?For any non-urgent questions, you may also contact your provider using MyChart. We now offer e-Visits for anyone 18 and older to request care online for non-urgent symptoms. For details visit [email or web address]. ?  ?Also download the MyChart app! Go to the app store, search "MyChart", open the app, select [facility], and log in with your MyChart username and password. ? ?Due to Covid, a mask is required upon entering the hospital/clinic. If you do not have a mask, one will be given to you upon arrival. For doctor visits, patients may have 1 support person aged 18 or older with them. For treatment visits, patients cannot have anyone with them due to current Covid guidelines and our immunocompromised population.  ? ?

## 2021-05-08 DIAGNOSIS — R0683 Snoring: Secondary | ICD-10-CM | POA: Diagnosis not present

## 2021-05-08 DIAGNOSIS — G4733 Obstructive sleep apnea (adult) (pediatric): Secondary | ICD-10-CM | POA: Diagnosis not present

## 2021-05-09 DIAGNOSIS — Z7901 Long term (current) use of anticoagulants: Secondary | ICD-10-CM | POA: Diagnosis not present

## 2021-05-20 ENCOUNTER — Other Ambulatory Visit (HOSPITAL_COMMUNITY): Payer: Self-pay

## 2021-05-20 DIAGNOSIS — Z148 Genetic carrier of other disease: Secondary | ICD-10-CM | POA: Diagnosis not present

## 2021-05-20 DIAGNOSIS — E039 Hypothyroidism, unspecified: Secondary | ICD-10-CM | POA: Diagnosis not present

## 2021-05-20 DIAGNOSIS — S0101XA Laceration without foreign body of scalp, initial encounter: Secondary | ICD-10-CM | POA: Diagnosis not present

## 2021-05-20 DIAGNOSIS — I700 Atherosclerosis of aorta: Secondary | ICD-10-CM | POA: Diagnosis not present

## 2021-05-20 DIAGNOSIS — J439 Emphysema, unspecified: Secondary | ICD-10-CM | POA: Diagnosis not present

## 2021-05-20 DIAGNOSIS — I82401 Acute embolism and thrombosis of unspecified deep veins of right lower extremity: Secondary | ICD-10-CM | POA: Diagnosis not present

## 2021-05-20 DIAGNOSIS — E538 Deficiency of other specified B group vitamins: Secondary | ICD-10-CM | POA: Diagnosis not present

## 2021-05-20 DIAGNOSIS — N182 Chronic kidney disease, stage 2 (mild): Secondary | ICD-10-CM | POA: Diagnosis not present

## 2021-05-20 DIAGNOSIS — C641 Malignant neoplasm of right kidney, except renal pelvis: Secondary | ICD-10-CM | POA: Diagnosis not present

## 2021-05-20 DIAGNOSIS — F331 Major depressive disorder, recurrent, moderate: Secondary | ICD-10-CM | POA: Diagnosis not present

## 2021-05-20 DIAGNOSIS — Z86711 Personal history of pulmonary embolism: Secondary | ICD-10-CM | POA: Diagnosis not present

## 2021-05-20 DIAGNOSIS — F0390 Unspecified dementia without behavioral disturbance: Secondary | ICD-10-CM | POA: Diagnosis not present

## 2021-05-20 MED ORDER — DOXYCYCLINE HYCLATE 100 MG PO TABS
ORAL_TABLET | ORAL | 0 refills | Status: DC
  Filled 2021-05-20 (×2): qty 10, 5d supply, fill #0

## 2021-05-26 ENCOUNTER — Other Ambulatory Visit (HOSPITAL_COMMUNITY): Payer: Self-pay

## 2021-05-27 ENCOUNTER — Other Ambulatory Visit (HOSPITAL_COMMUNITY): Payer: Self-pay

## 2021-05-27 MED ORDER — LEVOTHYROXINE SODIUM 75 MCG PO TABS
ORAL_TABLET | ORAL | 3 refills | Status: AC
Start: 2021-05-27 — End: ?
  Filled 2021-05-27: qty 90, 90d supply, fill #0
  Filled 2021-07-01 – 2021-08-18 (×3): qty 90, 90d supply, fill #1

## 2021-05-31 ENCOUNTER — Inpatient Hospital Stay: Payer: PPO

## 2021-05-31 ENCOUNTER — Other Ambulatory Visit: Payer: Self-pay

## 2021-05-31 ENCOUNTER — Inpatient Hospital Stay (HOSPITAL_BASED_OUTPATIENT_CLINIC_OR_DEPARTMENT_OTHER): Payer: PPO | Admitting: Oncology

## 2021-05-31 ENCOUNTER — Inpatient Hospital Stay: Payer: PPO | Attending: Oncology

## 2021-05-31 VITALS — BP 127/68 | HR 68 | Temp 97.8°F | Resp 17 | Ht 69.0 in | Wt 187.4 lb

## 2021-05-31 DIAGNOSIS — Z7901 Long term (current) use of anticoagulants: Secondary | ICD-10-CM | POA: Diagnosis not present

## 2021-05-31 DIAGNOSIS — C772 Secondary and unspecified malignant neoplasm of intra-abdominal lymph nodes: Secondary | ICD-10-CM | POA: Insufficient documentation

## 2021-05-31 DIAGNOSIS — E079 Disorder of thyroid, unspecified: Secondary | ICD-10-CM | POA: Insufficient documentation

## 2021-05-31 DIAGNOSIS — C641 Malignant neoplasm of right kidney, except renal pelvis: Secondary | ICD-10-CM

## 2021-05-31 DIAGNOSIS — Z5112 Encounter for antineoplastic immunotherapy: Secondary | ICD-10-CM | POA: Insufficient documentation

## 2021-05-31 DIAGNOSIS — Z79899 Other long term (current) drug therapy: Secondary | ICD-10-CM | POA: Insufficient documentation

## 2021-05-31 DIAGNOSIS — Z86718 Personal history of other venous thrombosis and embolism: Secondary | ICD-10-CM | POA: Diagnosis not present

## 2021-05-31 DIAGNOSIS — C649 Malignant neoplasm of unspecified kidney, except renal pelvis: Secondary | ICD-10-CM

## 2021-05-31 LAB — CMP (CANCER CENTER ONLY)
ALT: 29 U/L (ref 0–44)
AST: 31 U/L (ref 15–41)
Albumin: 3.5 g/dL (ref 3.5–5.0)
Alkaline Phosphatase: 80 U/L (ref 38–126)
Anion gap: 7 (ref 5–15)
BUN: 16 mg/dL (ref 8–23)
CO2: 24 mmol/L (ref 22–32)
Calcium: 8.9 mg/dL (ref 8.9–10.3)
Chloride: 108 mmol/L (ref 98–111)
Creatinine: 1.17 mg/dL (ref 0.61–1.24)
GFR, Estimated: >60 mL/min (ref >60)
Glucose, Bld: 104 mg/dL — ABNORMAL HIGH (ref 70–99)
Potassium: 3.7 mmol/L (ref 3.5–5.1)
Sodium: 139 mmol/L (ref 135–145)
Total Bilirubin: 0.6 mg/dL (ref 0.3–1.2)
Total Protein: 6.1 g/dL — ABNORMAL LOW (ref 6.5–8.1)

## 2021-05-31 LAB — CBC WITH DIFFERENTIAL (CANCER CENTER ONLY)
Abs Immature Granulocytes: 0.02 10*3/uL (ref 0.00–0.07)
Basophils Absolute: 0 10*3/uL (ref 0.0–0.1)
Basophils Relative: 0 %
Eosinophils Absolute: 0 10*3/uL (ref 0.0–0.5)
Eosinophils Relative: 0 %
HCT: 44.3 % (ref 39.0–52.0)
Hemoglobin: 15.1 g/dL (ref 13.0–17.0)
Immature Granulocytes: 0 %
Lymphocytes Relative: 24 %
Lymphs Abs: 1.6 10*3/uL (ref 0.7–4.0)
MCH: 27.6 pg (ref 26.0–34.0)
MCHC: 34.1 g/dL (ref 30.0–36.0)
MCV: 80.8 fL (ref 80.0–100.0)
Monocytes Absolute: 0.9 10*3/uL (ref 0.1–1.0)
Monocytes Relative: 14 %
Neutro Abs: 4.3 10*3/uL (ref 1.7–7.7)
Neutrophils Relative %: 62 %
Platelet Count: 144 10*3/uL — ABNORMAL LOW (ref 150–400)
RBC: 5.48 MIL/uL (ref 4.22–5.81)
RDW: 15.3 % (ref 11.5–15.5)
WBC Count: 6.9 10*3/uL (ref 4.0–10.5)
nRBC: 0 % (ref 0.0–0.2)

## 2021-05-31 LAB — TSH: TSH: 2.04 u[IU]/mL (ref 0.350–4.500)

## 2021-05-31 MED ORDER — SODIUM CHLORIDE 0.9 % IV SOLN: Freq: Once | INTRAVENOUS | Status: AC

## 2021-05-31 MED ORDER — SODIUM CHLORIDE 0.9 % IV SOLN
480.0000 mg | Freq: Once | INTRAVENOUS | Status: AC
  Administered 2021-05-31: 480 mg via INTRAVENOUS
  Filled 2021-05-31: qty 48

## 2021-05-31 NOTE — Patient Instructions (Signed)
[facility]  ? Discharge Instructions: ?Thank you for choosing [facility] to provide your oncology and hematology care.  ? ?If you have a lab appointment with the Cancer Center, please go directly to the Cancer Center and check in at the registration area. ?  ?Wear comfortable clothing and clothing appropriate for easy access to any Portacath or PICC line.  ? ?We strive to give you quality time with your provider. You may need to reschedule your appointment if you arrive late (15 or more minutes).  Arriving late affects you and other patients whose appointments are after yours.  Also, if you miss three or more appointments without notifying the office, you may be dismissed from the clinic at the provider?s discretion.    ?  ?For prescription refill requests, have your pharmacy contact our office and allow 72 hours for refills to be completed.   ? ?Today you received the following chemotherapy and/or immunotherapy agents: nivolumab    ?  ?To help prevent nausea and vomiting after your treatment, we encourage you to take your nausea medication as directed. ? ?BELOW ARE SYMPTOMS THAT SHOULD BE REPORTED IMMEDIATELY: ?*FEVER GREATER THAN 100.4 F (38 ?C) OR HIGHER ?*CHILLS OR SWEATING ?*NAUSEA AND VOMITING THAT IS NOT CONTROLLED WITH YOUR NAUSEA MEDICATION ?*UNUSUAL SHORTNESS OF BREATH ?*UNUSUAL BRUISING OR BLEEDING ?*URINARY PROBLEMS (pain or burning when urinating, or frequent urination) ?*BOWEL PROBLEMS (unusual diarrhea, constipation, pain near the anus) ?TENDERNESS IN MOUTH AND THROAT WITH OR WITHOUT PRESENCE OF ULCERS (sore throat, sores in mouth, or a toothache) ?UNUSUAL RASH, SWELLING OR PAIN  ?UNUSUAL VAGINAL DISCHARGE OR ITCHING  ? ?Items with * indicate a potential emergency and should be followed up as soon as possible or go to the Emergency Department if any problems should occur. ? ?Please show the CHEMOTHERAPY ALERT CARD or IMMUNOTHERAPY ALERT CARD at check-in  to the Emergency Department and triage nurse. ? ?Should you have questions after your visit or need to cancel or reschedule your appointment, please contact [facility]  Dept: [phone]  and follow the prompts.  Office hours are 8:00 a.m. to 4:30 p.m. Monday - Friday. Please note that voicemails left after 4:00 p.m. may not be returned until the following business day.  We are closed weekends and major holidays. You have access to a nurse at all times for urgent questions. Please call the main number to the clinic Dept: [phone] and follow the prompts. ? ? ?For any non-urgent questions, you may also contact your provider using MyChart. We now offer e-Visits for anyone 18 and older to request care online for non-urgent symptoms. For details visit [email or web address]. ?  ?Also download the MyChart app! Go to the app store, search "MyChart", open the app, select [facility], and log in with your MyChart username and password. ? ?Due to Covid, a mask is required upon entering the hospital/clinic. If you do not have a mask, one will be given to you upon arrival. For doctor visits, patients may have 1 support person aged 18 or older with them. For treatment visits, patients cannot have anyone with them due to current Covid guidelines and our immunocompromised population.  ? ?

## 2021-05-31 NOTE — Progress Notes (Signed)
Hematology and Oncology Follow Up Visit  Tyler Mason [id number] 1950-03-08 71 y.o. 05/31/2021 7:55 AM [name], MDHusain, [name], MD   Principle Diagnosis: 71 year old man with stage IV kidney cancer with retroperitoneal adenopathy diagnosed in January 2023.   Prior Therapy:  He is status post biopsy completed on January 22, 2021 of the retroperitoneal lymph node.  Ipilimumab 1 mg/kg with nivolumab 240 mg total dose started on February 08, 2021.  He completed 4 cycles of therapy in April 2023.  Current therapy: Nivolumab 480 mg maintenance of monthly started in April 2023.  He is here for the next cycle.    Interim History: Mr. Ptacek presents today for repeat evaluation.  Since last visit, he reports no complaints at this time.  He tolerated the last cycle of nivolumab without any new concerns.  Denies any nausea, vomiting or abdominal pain.  He denies any diarrhea or fatigue.  He denies any respiratory complaints.  He denies any pruritus or skin rash.  His performance status quality of life remains stable.    Medications: Reviewed without changes. Current Outpatient Medications  Medication Sig Dispense Refill   acetaminophen (TYLENOL) 325 MG tablet Take 2 tablets (650 mg total) by mouth every 4 (four) hours as needed for mild pain.     ARIPiprazole (ABILIFY) 5 MG tablet TAKE 1 TABLET BY MOUTH ONCE DAILY IN THE MORNING. 90 tablet 4   ARIPiprazole (ABILIFY) 5 MG tablet Take 1 tablet by mouth every morning 90 tablet 4   buPROPion (WELLBUTRIN XL) 300 MG 24 hr tablet Take 1 tablet (300 mg total) by mouth every morning. 90 tablet 2   buPROPion (WELLBUTRIN XL) 300 MG 24 hr tablet Take 1 tablet by mouth every morning 90 tablet 2   donepezil (ARICEPT) 5 MG tablet Take 1/2 tablet daily for 2 weeks, then increase to 1 tablet daily 30 tablet 11   doxycycline (VIBRA-TABS) 100 MG tablet Take one tablet by mouth twice daily for 5 days 10 tablet 0   ibuprofen (ADVIL) 400 MG tablet Take 1  tablet by mouth every 4  hours as needed for moderate pain. 30 tablet 0   levothyroxine (SYNTHROID) 75 MCG tablet Take 1 tablet by mouth every morning on an empty stomach 90 tablet 3   Melatonin 5 MG CAPS Take by mouth at bedtime.     pantoprazole (PROTONIX) 40 MG tablet Take 1 tablet (40 mg total) by mouth daily. 90 tablet 2   prochlorperazine (COMPAZINE) 10 MG tablet Take 1 tablet (10 mg total) by mouth every 6 (six) hours as needed for nausea or vomiting. 30 tablet 0   simvastatin (ZOCOR) 20 MG tablet TAKE 1 TABLET BY MOUTH ONCE DAILY 90 tablet 3   simvastatin (ZOCOR) 20 MG tablet TAKE 1 TABLET BY MOUTH ONCE DAILY 90 tablet 4   vitamin B-12 (CYANOCOBALAMIN) 1000 MCG tablet Take 1,000 mcg by mouth daily.     warfarin (COUMADIN) 5 MG tablet Take 1 and 1/2 tablets by mouth on Friday and Sunday. Take 1 tablet by mouth on all other days. 96 tablet 3   No current facility-administered medications for this visit.     Allergies:  Allergies  Allergen Reactions   Ampicillin Other (See Comments)    Made tongue sore Has patient had a PCN reaction causing immediate rash, facial/tongue/throat swelling, SOB or lightheadedness with hypotension: no Has patient had a PCN reaction causing severe rash involving mucus membranes or skin necrosis: no Has patient had a PCN reaction that  required hospitalization no Has patient had a PCN reaction occurring within the last 10 years: no If all of the above answers are "NO", then may proceed with Cephalosporin use.    Macrodantin [Nitrofurantoin Macrocrystal] Itching, Swelling and Rash     Physical Exam:  Blood pressure 127/68, pulse 68, temperature 97.8 F (36.6 C), temperature source Temporal, resp. rate 17, height '5\' 9"'$  (1.753 m), weight 187 lb 6.4 oz (85 kg), SpO2 97 %.    ECOG: 1   General appearance: Comfortable appearing without any discomfort Head: Normocephalic without any trauma Oropharynx: Mucous membranes are moist and pink without any  thrush or ulcers. Eyes: Pupils are equal and round reactive to light. Lymph nodes: No cervical, supraclavicular, inguinal or axillary lymphadenopathy.   Heart:regular rate and rhythm.  S1 and S2 without leg edema. Lung: Clear without any rhonchi or wheezes.  No dullness to percussion. Abdomin: Soft, nontender, nondistended with good bowel sounds.  No hepatosplenomegaly. Musculoskeletal: No joint deformity or effusion.  Full range of motion noted. Neurological: No deficits noted on motor, sensory and deep tendon reflex exam. Skin: No petechial rash or dryness.  Appeared moist.       Lab Results: Lab Results  Component Value Date   WBC 7.0 05/03/2021   HGB 15.8 05/03/2021   HCT 46.7 05/03/2021   MCV 80.7 05/03/2021   PLT 163 05/03/2021     Chemistry      Component Value Date/Time   NA 138 05/03/2021 0828   K 3.9 05/03/2021 0828   CL 110 05/03/2021 0828   CO2 24 05/03/2021 0828   BUN 15 05/03/2021 0828   CREATININE 1.20 05/03/2021 0828      Component Value Date/Time   CALCIUM 8.9 05/03/2021 0828   ALKPHOS 82 05/03/2021 0828   AST 35 05/03/2021 0828   ALT 33 05/03/2021 0828   BILITOT 0.6 05/03/2021 0828        Impression and Plan:  71 year old with:  1.  Stage IV kidney disease diagnosed in January 2023.  He presented with right kidney mass and retroperitoneal adenopathy biopsy-proven to be renal cell carcinoma.  He continues to tolerate nivolumab without any major complications.  He had an excellent response to immunotherapy and the plan is to continue the same doses scheduled.  Complications including immune mediated issues as well as GI toxicity and dermatological issues were reiterated.  He is to continue immunotherapy maintenance as long as he is tolerating it and responsive.  Salvage nephrectomy could also be considered if he continues to have long term disease-free interval.  He is agreeable to proceed with this plan.   2.  IV access: Port-A-Cath option has been  deferred.  Peripheral veins are currently in use.   3.  Antiemetics: Compazine is available to him without any nausea or vomiting.   4.  Immune mediated complications: I continue to educate him about these complications including pneumonitis, colitis and thyroid disease.  He has not experienced any at this time.   5.  Recurrent venous thromboembolism: He continues to be therapeutic on warfarin.   6.  CNS metastasis surveillance: No evidence of metastatic disease noted at this time.  MRI of the brain obtained in February 2023.   7.  Follow-up: He will return in 1 month for the next cycle of therapy.   30  minutes were spent on this encounter.  The time was dedicated to reviewing imaging studies, laboratory data, disease status update and addressing complication related to his cancer and  cancer therapy.     Zola Button, MD 5/26/20237:55 AM

## 2021-06-04 DIAGNOSIS — Z7901 Long term (current) use of anticoagulants: Secondary | ICD-10-CM | POA: Diagnosis not present

## 2021-06-08 DIAGNOSIS — G4733 Obstructive sleep apnea (adult) (pediatric): Secondary | ICD-10-CM | POA: Diagnosis not present

## 2021-06-08 DIAGNOSIS — R0683 Snoring: Secondary | ICD-10-CM | POA: Diagnosis not present

## 2021-06-10 ENCOUNTER — Telehealth: Payer: Self-pay | Admitting: Oncology

## 2021-06-10 ENCOUNTER — Telehealth: Payer: Self-pay | Admitting: Internal Medicine

## 2021-06-10 ENCOUNTER — Other Ambulatory Visit (HOSPITAL_COMMUNITY): Payer: Self-pay

## 2021-06-10 NOTE — Telephone Encounter (Signed)
Called patient regarding upcoming appointments, left a voicemail. 

## 2021-06-12 ENCOUNTER — Other Ambulatory Visit (HOSPITAL_COMMUNITY): Payer: Self-pay

## 2021-06-18 ENCOUNTER — Ambulatory Visit (INDEPENDENT_AMBULATORY_CARE_PROVIDER_SITE_OTHER): Payer: PPO | Admitting: Physician Assistant

## 2021-06-18 ENCOUNTER — Other Ambulatory Visit (HOSPITAL_COMMUNITY): Payer: Self-pay

## 2021-06-18 ENCOUNTER — Encounter: Payer: Self-pay | Admitting: Physician Assistant

## 2021-06-18 VITALS — BP 118/78 | HR 76 | Resp 18 | Ht 69.5 in | Wt 184.0 lb

## 2021-06-18 DIAGNOSIS — F0390 Unspecified dementia without behavioral disturbance: Secondary | ICD-10-CM

## 2021-06-18 MED ORDER — DONEPEZIL HCL 10 MG PO TABS
ORAL_TABLET | ORAL | 11 refills | Status: DC
  Filled 2021-06-18: qty 30, 30d supply, fill #0
  Filled 2021-07-13: qty 30, 30d supply, fill #1
  Filled 2021-08-11: qty 30, 30d supply, fill #2

## 2021-06-18 NOTE — Progress Notes (Signed)
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  DaTscan showed asymmetric decreased activity within the posterior striata him, right more than left, suggestive of Parkinson syndrome pathology.  Working diagnosis of progressive supranuclear palsy (PSP).  He is on donepezil 10 mg daily, tolerating well.  He is also continuing to follow-up with psychiatry for mood control.  MMSE today is 26/30, with delayed recall 3/3, he had difficulty writing a sentence and drawing.   Recommendations:    Continue donepezil 10 mg daily Continue to control mood with psychiatry Follow-up in 6 months. Continue to monitor driving    Case discussed with Dr. Delice Lesch who agrees with the plan    Subjective:    Tyler Mason is a very pleasant 71 y.o. RH male  seen today in follow up for Dementia. This patient is accompanied in the office by his wife who supplements the history.  Previous records as well as any outside records available were reviewed by me prior to todays visit.  Patient was last seen at our office on 12/18/2020.  He is on donepezil 10 mg daily, tolerating well.    Any changes in memory since last visit?  He denies any changes since his last visit, his memory is stable (according to him and his wife).   Any health changes since his last visit?.  The patient was diagnosed with kidney cancer, and since last February, he has been on immunotherapy, with good response.  According to the last oncology note, the patient is clinically stable. repeats oneself?  Patient  denies Disoriented when walking into a room?  Patient denies   Leaving objects in unusual places?  Wife reports that the patient places objects in different areas, and then has difficulty finding them. Ambulates  with difficulty?   Patient denies   Recent falls?  Patient denies   Any head injuries?  Patient denies   History of seizures?   Patient denies   Wandering behavior?  Patient denies   Patient drives?  He drives a short distance radius, only to visit his friend who lives in [place] 15 minutes away, he never takes the highways.  Other than that, his wife does most of the driving. Any mood changes such irritability agitation?  He is wife reports that his expression is flat, "like jelly " denies any irritability, agitation or sundowning.   Any depression?: Well controlled Hallucinations?  Patient denies   Paranoia?  Patient denies   Patient reports that he sleeps well without vivid dreams, REM behavior or sleepwalking  History of sleep apnea? Endorsed, on CPAP Any hygiene concerns?  Patient denies   Independent of bathing and dressing?  Endorsed  Does the patient needs help with medications?  He is in control of the medications, his wife monitors.   Who is in charge of the finances?  He and his wife do the finances together. Any changes in appetite?  Patient denies. Denies trouble swallowing. NO increased salivation   Patient have trouble swallowing? Patient denies   Does the patient cook?  Patient denies   Any kitchen accidents such as leaving the stove on? Patient denies   Any headaches?  Patient denies   Double vision? Patient denies. Any focal numbness or tingling?  Patient denies   Chronic back pain Patient denies   Unilateral weakness?  Patient denies   Any tremors?  Patient  denies, no issues using utensils, except with cutting Any history of anosmia?  Patient denies   Any incontinence of urine?  His last UTI was in December 2022, since then, he has some "bathroom trips ",  and occasionally does not make it to the bathroom.  That has, he wears pull-ups.   Any bowel dysfunction?  Occasionally, he has diarrhea.  History on Initial Assessment 08/23/2019: This is a 71 year old left-handed man with a history of hyperlipidemia, DVT, PE on chronic anticoagulation with Coumadin, traumatic SDH in 1994 s/p right craniotomy, depression, presenting for evaluation of worsening cognition and staggering gait. His wife is present to provide additional history. He feels his memory is "not good." His wife reports he has had cognitive difficulties since his head injury in 1994, worse since May. She started noticing word-finding difficulties and gait changes and discussed these with his psychiatrist. He was started on Wellbutrin and "things went [place]," after stopping Wellbutrin, symptoms got a little better and he was started on Abilify a few weeks ago. She thinks some things are a little better since starting Abilify. She states he has been stoic for a long time, but since May he has been having more difficulty talking, saying what he wants to say. Sometimes words are not coming out quite right. Hearing has also become more of a problem, he has had a hearing evaluation recently. She has noticed his ability to move around has also slowed down, walking is slower. He has had more frequent falls since May. He has baseline left leg weakness since the SDH, but over the past few months, he reports his left foot never knows where it is going to sit down. He denies getting lost driving. His wife manages finances. She started noticing last May that he was not doing a great job with his medications, he was getting his Coumadin doses mixed up with supratherapeutic INR. She started taking over medications at that time. He misplaces things frequently, which is not new. No difficulties operating the microwave or remote control. He is independent with dressing and bathing.They deny any urinary incontinence. No REM  behavior disorder, paranoia, or hallucinations. He was having sleep difficulties, but this has improved with the Abilify. He denies any headaches, focal numbness/tingling/weakness, bowel dysfunction. He has neck and back pain. He has had horizontal diplopia since the SDH, with left lateral rectus palsy. He has had decreased sense of smell since the SDH. He gets lightheaded when standing sometimes. His last fall was a few weeks ago, he tripped over the laundry basket. Six week before that, he tripped over the dog. His mother had dementia. No alcohol use.    I personally reviewed head CT without contrast done 06/2019 no acute changes, diffuse atrophy and chronic microvascular disease  DaTscan in 01/2020 showed asymmetric decreased activity within the posterior striatum suggestive of Parkinson's syndrome pathology, right more than left.   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 concerning for progressive supranuclear palsy.   PREVIOUS MEDICATIONS:   CURRENT MEDICATIONS:  Outpatient Encounter Medications as of 06/18/2021  Medication Sig   acetaminophen (TYLENOL)  325 MG tablet Take 2 tablets (650 mg total) by mouth every 4 (four) hours as needed for mild pain.   ARIPiprazole (ABILIFY) 5 MG tablet Take 1 tablet by mouth every morning   buPROPion (WELLBUTRIN XL) 300 MG 24 hr tablet Take 1 tablet (300 mg total) by mouth every morning.   buPROPion (WELLBUTRIN XL) 300 MG 24 hr tablet Take 1 tablet by mouth every morning   donepezil (ARICEPT) 5 MG tablet Take 1/2 tablet daily for 2 weeks, then increase to 1 tablet daily   levothyroxine (SYNTHROID) 75 MCG tablet Take 1 tablet by mouth every morning on an empty stomach   Melatonin 5 MG CAPS Take by mouth at bedtime.   pantoprazole (PROTONIX) 40 MG  tablet Take 1 tablet (40 mg total) by mouth daily.   simvastatin (ZOCOR) 20 MG tablet TAKE 1 TABLET BY MOUTH ONCE DAILY   simvastatin (ZOCOR) 20 MG tablet TAKE 1 TABLET BY MOUTH ONCE DAILY   vitamin B-12 (CYANOCOBALAMIN) 1000 MCG tablet Take 1,000 mcg by mouth daily.   warfarin (COUMADIN) 5 MG tablet Take 1 and 1/2 tablets by mouth on Friday and Sunday. Take 1 tablet by mouth on all other days.   ARIPiprazole (ABILIFY) 5 MG tablet TAKE 1 TABLET BY MOUTH ONCE DAILY IN THE MORNING.   doxycycline (VIBRA-TABS) 100 MG tablet Take one tablet by mouth twice daily for 5 days (Patient not taking: Reported on 06/18/2021)   ibuprofen (ADVIL) 400 MG tablet Take 1 tablet by mouth every 4  hours as needed for moderate pain. (Patient not taking: Reported on 06/18/2021)   prochlorperazine (COMPAZINE) 10 MG tablet Take 1 tablet (10 mg total) by mouth every 6 (six) hours as needed for nausea or vomiting. (Patient not taking: Reported on 06/18/2021)   No facility-administered encounter medications on file as of 06/18/2021.        No data to display             No data to display          Objective:     PHYSICAL EXAMINATION:    VITALS:   Vitals:   06/18/21 1100  Resp: 18  Height: 5' 9.5" (1.765 m)    GEN:  The patient appears stated age and is in NAD. HEENT:  Normocephalic, atraumatic.   Neurological examination:  General: NAD, well-groomed, appears stated age. Orientation: The patient is alert. Oriented to person, place and date Cranial nerves: There is good facial symmetry. Chronic left lateral rectus palsy, slight restriction R eye gaze .No nystagmus, visual fields full. The speech is fluent and clear. No aphasia or dysarthria. Fund of knowledge is appropriate. Recent and remote memory are impaired. Attention and concentration are normal.  Able to name objects and repeat phrases.  Hearing is intact to conversational tone.    Sensation: Sensation is intact to light touch throughout Motor:  Strength is at least antigravity x4. Tremors: none  DTR's 2/4 in UE/LE     Movement examination: Tone: There is normal tone in the UE/LE, minimal cogwheeling R>L Abnormal movements:  no tremor noted today.  No myoclonus.  No asterixis.   Coordination: decreased finger and foot taps, RAM's. Normal finger to nose  Gait and Station: The patient has no difficulty arising out of a deep-seated chair without the use of the hands. Reduced arm swing. Gait is cautious and narrow. postural instability noted   Thank you for allowing [place] the opportunity to participate in the care of this nice patient.  Please do not hesitate to contact us for any questions or concerns.   Total time spent on today's visit was 35 minutes dedicated to this patient today, preparing to see patient, examining the patient, ordering tests and/or medications and counseling the patient, documenting clinical information in the EHR or other health record, independently interpreting results and communicating results to the patient/family, discussing treatment and goals, answering patient's questions and coordinating care.  Cc:  [name], MD  Sharene Butters 06/18/2021 11:05 AM

## 2021-06-18 NOTE — Patient Instructions (Addendum)
It was a pleasure to see you today at our office.   Recommendations:  Follow up in  6 months Increase donepezil 10 mg daily. Side effects were discussed    Whom to call:  Memory  decline, memory medications: Call our office [phone]   For psychiatric meds, mood meds: Please have your primary care physician manage these medications.   Counseling regarding caregiver distress, including caregiver depression, anxiety and issues regarding community resources, adult day care programs, adult living facilities, or memory care questions:   Feel free to contact Winfred, Social Worker at [phone]   For assessment of decision of mental capacity and competency:  Call Dr. Anthoney Harada, geriatric psychiatrist at [phone]  For guidance in geriatric dementia issues please call [facility] [phone]  For guidance regarding [facility] and if placement were needed at the facility, contact Arnell Asal, Social Worker tel: [phone]  If you have any severe symptoms of a stroke, or other severe issues such as confusion,severe chills or fever, etc call 911 or go to the ER as you may need to be evaluated further   Feel free to visit Facebook page " [email or web address]" for tips of how to care for people with memory problems.       RECOMMENDATIONS FOR ALL PATIENTS WITH MEMORY PROBLEMS: 1. Continue to exercise (Recommend 30 minutes of walking everyday, or 3 hours every week) 2. Increase social interactions - continue going to [place] and enjoy social gatherings with friends and family 3. Eat healthy, avoid fried foods and eat more fruits and vegetables 4. Maintain adequate blood pressure, blood sugar, and blood cholesterol level. Reducing the risk of stroke and cardiovascular disease also helps promoting better memory. 5. Avoid stressful situations. Live a simple life and avoid aggravations. Organize your time and prepare for the next day in  anticipation. 6. Sleep well, avoid any interruptions of sleep and avoid any distractions in the bedroom that may interfere with adequate sleep quality 7. Avoid sugar, avoid sweets as there is a strong link between excessive sugar intake, diabetes, and cognitive impairment We discussed the Mediterranean diet, which has been shown to help patients reduce the risk of progressive memory disorders and reduces cardiovascular risk. This includes eating fish, eat fruits and green leafy vegetables, nuts like almonds and hazelnuts, walnuts, and also use olive oil. Avoid fast foods and fried foods as much as possible. Avoid sweets and sugar as sugar use has been linked to worsening of memory function.  There is always a concern of gradual progression of memory problems. If this is the case, then we may need to adjust level of care according to patient needs. Support, both to the patient and caregiver, should then be put into place.    The Alzheimer[employer] Association is here all day, every day for people facing Alzheimer's disease through our free 24/7 Helpline: [phone]. The Helpline provides reliable information and support to all those who need assistance, such as individuals living with memory loss, Alzheimer's or other dementia, caregivers, health care professionals and the public.  Our highly trained and knowledgeable staff can help you with: Understanding memory loss, dementia and Alzheimer's  Medications and other treatment options  General information about aging and brain health  Skills to provide quality care and to find the best care from professionals  Legal, financial and living-arrangement decisions Our Helpline also features: Confidential care consultation provided by master's level clinicians who can help with decision-making support, crisis assistance and education on  issues families face every day  Help in a caller's preferred language using our translation service that features more than 200  languages and dialects  Referrals to local community programs, services and ongoing support     FALL PRECAUTIONS: Be cautious when walking. Scan the area for obstacles that may increase the risk of trips and falls. When getting up in the mornings, sit up at the edge of the bed for a few minutes before getting out of bed. Consider elevating the bed at the head end to avoid drop of blood pressure when getting up. Walk always in a well-lit room (use night lights in the walls). Avoid area rugs or power cords from appliances in the middle of the walkways. Use a walker or a cane if necessary and consider physical therapy for balance exercise. Get your eyesight checked regularly.  FINANCIAL OVERSIGHT: Supervision, especially oversight when making financial decisions or transactions is also recommended.  HOME SAFETY: Consider the safety of the kitchen when operating appliances like stoves, microwave oven, and blender. Consider having supervision and share cooking responsibilities until no longer able to participate in those. Accidents with firearms and other hazards in the house should be identified and addressed as well.   ABILITY TO BE LEFT ALONE: If patient is unable to contact 911 operator, consider using LifeLine, or when the need is there, arrange for someone to stay with patients. Smoking is a fire hazard, consider supervision or cessation. Risk of wandering should be assessed by caregiver and if detected at any point, supervision and safe proof recommendations should be instituted.  MEDICATION SUPERVISION: Inability to self-administer medication needs to be constantly addressed. Implement a mechanism to ensure safe administration of the medications.   DRIVING: Regarding driving, in patients with progressive memory problems, driving will be impaired. We advise to have someone else do the driving if trouble finding directions or if minor accidents are reported. Independent driving assessment is  available to determine safety of driving.   If you are interested in the driving assessment, you can contact the following:  [employer] in [place] [phone]  [facility] [phone]  [facility] [phone]  [facility] [phone] or [phone]      Mediterranean Diet A Mediterranean diet refers to food and lifestyle choices that are based on the traditions of countries located on the [place]. This way of eating has been shown to help prevent certain conditions and improve outcomes for people who have chronic diseases, like kidney disease and heart disease. What are tips for following this plan? Lifestyle  Cook and eat meals together with your family, when possible. Drink enough fluid to keep your urine clear or pale yellow. Be physically active every day. This includes: Aerobic exercise like running or swimming. Leisure activities like gardening, walking, or housework. Get 7-8 hours of sleep each night. If recommended by your health care provider, drink red wine in moderation. This means 1 glass a day for nonpregnant women and 2 glasses a day for men. A glass of wine equals 5 oz (150 mL). Reading food labels  Check the serving size of packaged foods. For foods such as rice and pasta, the serving size refers to the amount of cooked product, not dry. Check the total fat in packaged foods. Avoid foods that have saturated fat or trans fats. Check the ingredients list for added sugars, such as corn syrup. Shopping  At the grocery store, buy most of your food from the areas near the   walls of the store. This includes: Fresh fruits and vegetables (produce). Grains, beans, nuts, and seeds. Some of these may be available in unpackaged forms or large amounts (in bulk). Fresh seafood. Poultry and eggs. Low-fat dairy products. Buy whole ingredients instead of prepackaged foods. Buy fresh fruits and vegetables in-season  from local farmers markets. Buy frozen fruits and vegetables in resealable bags. If you do not have access to quality fresh seafood, buy precooked frozen shrimp or canned fish, such as tuna, salmon, or sardines. Buy small amounts of raw or cooked vegetables, salads, or olives from the deli or salad bar at your store. Stock your pantry so you always have certain foods on hand, such as olive oil, canned tuna, canned tomatoes, rice, pasta, and beans. Cooking  Cook foods with extra-virgin olive oil instead of using butter or other vegetable oils. Have meat as a side dish, and have vegetables or grains as your main dish. This means having meat in small portions or adding small amounts of meat to foods like pasta or stew. Use beans or vegetables instead of meat in common dishes like chili or lasagna. Experiment with different cooking methods. Try roasting or broiling vegetables instead of steaming or sauteing them. Add frozen vegetables to soups, stews, pasta, or rice. Add nuts or seeds for added healthy fat at each meal. You can add these to yogurt, salads, or vegetable dishes. Marinate fish or vegetables using olive oil, lemon juice, garlic, and fresh herbs. Meal planning  Plan to eat 1 vegetarian meal one day each week. Try to work up to 2 vegetarian meals, if possible. Eat seafood 2 or more times a week. Have healthy snacks readily available, such as: Vegetable sticks with hummus. Greek yogurt. Fruit and nut trail mix. Eat balanced meals throughout the week. This includes: Fruit: 2-3 servings a day Vegetables: 4-5 servings a day Low-fat dairy: 2 servings a day Fish, poultry, or lean meat: 1 serving a day Beans and legumes: 2 or more servings a week Nuts and seeds: 1-2 servings a day Whole grains: 6-8 servings a day Extra-virgin olive oil: 3-4 servings a day Limit red meat and sweets to only a few servings a month What are my food choices? Mediterranean diet Recommended Grains:  Whole-grain pasta. Brown rice. Bulgar wheat. Polenta. Couscous. Whole-wheat bread. [place]. Vegetables: Artichokes. Beets. Broccoli. Cabbage. Carrots. Eggplant. Green beans. Chard. Kale. Spinach. Onions. Leeks. Peas. Squash. Tomatoes. Peppers. Radishes. Fruits: Apples. Apricots. Avocado. Berries. Bananas. Cherries. Dates. Figs. Grapes. Lemons. Melon. Oranges. Peaches. Plums. Pomegranate. Meats and other protein foods: Beans. Almonds. Sunflower seeds. Pine nuts. Peanuts. [place]. Salmon. Scallops. Shrimp. [place]. Tilapia. Clams. Oysters. Eggs. Dairy: Low-fat milk. Cheese. Greek yogurt. Beverages: Water. Red wine. Herbal tea. Fats and oils: Extra virgin olive oil. Avocado oil. Grape seed oil. Sweets and desserts: [place] yogurt with honey. Baked apples. Poached pears. Trail mix. Seasoning and other foods: Basil. Cilantro. Coriander. Cumin. Mint. Parsley. Sage. Rosemary. Tarragon. Garlic. Oregano. Thyme. Pepper. Balsalmic vinegar. Tahini. Hummus. Tomato sauce. Olives. Mushrooms. Limit these Grains: Prepackaged pasta or rice dishes. Prepackaged cereal with added sugar. Vegetables: Deep fried potatoes (french fries). Fruits: Fruit canned in syrup. Meats and other protein foods: Beef. Pork. Lamb. Poultry with skin. Hot dogs. Bernie[place] Salines. Dairy: Ice cream. Sour cream. Whole milk. Beverages: Juice. Sugar-sweetened soft drinks. Beer. Liquor and spirits. Fats and oils: Butter. Canola oil. Vegetable oil. Beef fat (tallow). Lard. Sweets and desserts: Cookies. Cakes. Pies. Candy. Seasoning and other foods: Mayonnaise. Premade sauces and marinades. The items  listed may not be a complete list. Talk with your dietitian about what dietary choices are right for you. Summary The Mediterranean diet includes both food and lifestyle choices. Eat a variety of fresh fruits and vegetables, beans, nuts, seeds, and whole grains. Limit the amount of red meat and sweets that you eat. Talk with your health care provider about  whether it is safe for you to drink red wine in moderation. This means 1 glass a day for nonpregnant women and 2 glasses a day for men. A glass of wine equals 5 oz (150 mL). This information is not intended to replace advice given to you by your health care provider. Make sure you discuss any questions you have with your health care provider. Document Released: 08/16/2015 Document Revised: 09/18/2015 Document Reviewed: 08/16/2015 Elsevier Interactive Patient Education  2017 Elsevier Inc.     

## 2021-06-27 ENCOUNTER — Inpatient Hospital Stay (HOSPITAL_BASED_OUTPATIENT_CLINIC_OR_DEPARTMENT_OTHER): Payer: PPO | Admitting: Oncology

## 2021-06-27 ENCOUNTER — Inpatient Hospital Stay: Payer: PPO

## 2021-06-27 ENCOUNTER — Inpatient Hospital Stay: Payer: PPO | Attending: Oncology

## 2021-06-27 ENCOUNTER — Other Ambulatory Visit: Payer: Self-pay

## 2021-06-27 VITALS — BP 135/71 | HR 64 | Temp 97.8°F | Resp 18

## 2021-06-27 VITALS — BP 125/70 | HR 72 | Temp 97.8°F | Resp 17 | Ht 69.5 in | Wt 187.5 lb

## 2021-06-27 DIAGNOSIS — C641 Malignant neoplasm of right kidney, except renal pelvis: Secondary | ICD-10-CM | POA: Diagnosis not present

## 2021-06-27 DIAGNOSIS — Z79899 Other long term (current) drug therapy: Secondary | ICD-10-CM | POA: Insufficient documentation

## 2021-06-27 DIAGNOSIS — Z5112 Encounter for antineoplastic immunotherapy: Secondary | ICD-10-CM | POA: Insufficient documentation

## 2021-06-27 DIAGNOSIS — Z86718 Personal history of other venous thrombosis and embolism: Secondary | ICD-10-CM | POA: Diagnosis not present

## 2021-06-27 DIAGNOSIS — C649 Malignant neoplasm of unspecified kidney, except renal pelvis: Secondary | ICD-10-CM

## 2021-06-27 DIAGNOSIS — Z7901 Long term (current) use of anticoagulants: Secondary | ICD-10-CM | POA: Insufficient documentation

## 2021-06-27 DIAGNOSIS — C772 Secondary and unspecified malignant neoplasm of intra-abdominal lymph nodes: Secondary | ICD-10-CM | POA: Insufficient documentation

## 2021-06-27 LAB — CBC WITH DIFFERENTIAL (CANCER CENTER ONLY)
Abs Immature Granulocytes: 0.02 10*3/uL (ref 0.00–0.07)
Basophils Absolute: 0 10*3/uL (ref 0.0–0.1)
Basophils Relative: 1 %
Eosinophils Absolute: 0.2 10*3/uL (ref 0.0–0.5)
Eosinophils Relative: 2 %
HCT: 44.1 % (ref 39.0–52.0)
Hemoglobin: 15.2 g/dL (ref 13.0–17.0)
Immature Granulocytes: 0 %
Lymphocytes Relative: 24 %
Lymphs Abs: 1.6 10*3/uL (ref 0.7–4.0)
MCH: 27.9 pg (ref 26.0–34.0)
MCHC: 34.5 g/dL (ref 30.0–36.0)
MCV: 81.1 fL (ref 80.0–100.0)
Monocytes Absolute: 0.9 10*3/uL (ref 0.1–1.0)
Monocytes Relative: 14 %
Neutro Abs: 4.1 10*3/uL (ref 1.7–7.7)
Neutrophils Relative %: 59 %
Platelet Count: 144 10*3/uL — ABNORMAL LOW (ref 150–400)
RBC: 5.44 MIL/uL (ref 4.22–5.81)
RDW: 15.4 % (ref 11.5–15.5)
WBC Count: 6.8 10*3/uL (ref 4.0–10.5)
nRBC: 0 % (ref 0.0–0.2)

## 2021-06-27 LAB — CMP (CANCER CENTER ONLY)
ALT: 26 U/L (ref 0–44)
AST: 25 U/L (ref 15–41)
Albumin: 3.6 g/dL (ref 3.5–5.0)
Alkaline Phosphatase: 74 U/L (ref 38–126)
Anion gap: 6 (ref 5–15)
BUN: 16 mg/dL (ref 8–23)
CO2: 25 mmol/L (ref 22–32)
Calcium: 9.2 mg/dL (ref 8.9–10.3)
Chloride: 107 mmol/L (ref 98–111)
Creatinine: 1.2 mg/dL (ref 0.61–1.24)
GFR, Estimated: >60 mL/min (ref >60)
Glucose, Bld: 98 mg/dL (ref 70–99)
Potassium: 3.8 mmol/L (ref 3.5–5.1)
Sodium: 138 mmol/L (ref 135–145)
Total Bilirubin: 0.6 mg/dL (ref 0.3–1.2)
Total Protein: 6.1 g/dL — ABNORMAL LOW (ref 6.5–8.1)

## 2021-06-27 LAB — TSH: TSH: 2.691 u[IU]/mL (ref 0.350–4.500)

## 2021-06-27 MED ORDER — SODIUM CHLORIDE 0.9 % IV SOLN: Freq: Once | INTRAVENOUS | Status: AC

## 2021-06-27 MED ORDER — SODIUM CHLORIDE 0.9 % IV SOLN
480.0000 mg | Freq: Once | INTRAVENOUS | Status: AC
  Administered 2021-06-27: 480 mg via INTRAVENOUS
  Filled 2021-06-27: qty 48

## 2021-06-27 NOTE — Patient Instructions (Signed)
[facility]  ? Discharge Instructions: ?Thank you for choosing [facility] to provide your oncology and hematology care.  ? ?If you have a lab appointment with the Cancer Center, please go directly to the Cancer Center and check in at the registration area. ?  ?Wear comfortable clothing and clothing appropriate for easy access to any Portacath or PICC line.  ? ?We strive to give you quality time with your provider. You may need to reschedule your appointment if you arrive late (15 or more minutes).  Arriving late affects you and other patients whose appointments are after yours.  Also, if you miss three or more appointments without notifying the office, you may be dismissed from the clinic at the provider?s discretion.    ?  ?For prescription refill requests, have your pharmacy contact our office and allow 72 hours for refills to be completed.   ? ?Today you received the following chemotherapy and/or immunotherapy agents: nivolumab    ?  ?To help prevent nausea and vomiting after your treatment, we encourage you to take your nausea medication as directed. ? ?BELOW ARE SYMPTOMS THAT SHOULD BE REPORTED IMMEDIATELY: ?*FEVER GREATER THAN 100.4 F (38 ?C) OR HIGHER ?*CHILLS OR SWEATING ?*NAUSEA AND VOMITING THAT IS NOT CONTROLLED WITH YOUR NAUSEA MEDICATION ?*UNUSUAL SHORTNESS OF BREATH ?*UNUSUAL BRUISING OR BLEEDING ?*URINARY PROBLEMS (pain or burning when urinating, or frequent urination) ?*BOWEL PROBLEMS (unusual diarrhea, constipation, pain near the anus) ?TENDERNESS IN MOUTH AND THROAT WITH OR WITHOUT PRESENCE OF ULCERS (sore throat, sores in mouth, or a toothache) ?UNUSUAL RASH, SWELLING OR PAIN  ?UNUSUAL VAGINAL DISCHARGE OR ITCHING  ? ?Items with * indicate a potential emergency and should be followed up as soon as possible or go to the Emergency Department if any problems should occur. ? ?Please show the CHEMOTHERAPY ALERT CARD or IMMUNOTHERAPY ALERT CARD at check-in  to the Emergency Department and triage nurse. ? ?Should you have questions after your visit or need to cancel or reschedule your appointment, please contact [facility]  Dept: [phone]  and follow the prompts.  Office hours are 8:00 a.m. to 4:30 p.m. Monday - Friday. Please note that voicemails left after 4:00 p.m. may not be returned until the following business day.  We are closed weekends and major holidays. You have access to a nurse at all times for urgent questions. Please call the main number to the clinic Dept: [phone] and follow the prompts. ? ? ?For any non-urgent questions, you may also contact your provider using MyChart. We now offer e-Visits for anyone 18 and older to request care online for non-urgent symptoms. For details visit [email or web address]. ?  ?Also download the MyChart app! Go to the app store, search "MyChart", open the app, select [facility], and log in with your MyChart username and password. ? ?Due to Covid, a mask is required upon entering the hospital/clinic. If you do not have a mask, one will be given to you upon arrival. For doctor visits, patients may have 1 support Nevayah Faust aged 70 or older with them. For treatment visits, patients cannot have anyone with them due to current Covid guidelines and our immunocompromised population.  ? ?

## 2021-07-01 ENCOUNTER — Other Ambulatory Visit (HOSPITAL_COMMUNITY): Payer: Self-pay

## 2021-07-01 MED ORDER — PANTOPRAZOLE SODIUM 40 MG PO TBEC
40.0000 mg | DELAYED_RELEASE_TABLET | Freq: Every day | ORAL | 3 refills | Status: AC
  Filled 2021-07-01: qty 90, 90d supply, fill #0
  Filled 2021-07-13: qty 90, 90d supply, fill #1

## 2021-07-04 DIAGNOSIS — G4733 Obstructive sleep apnea (adult) (pediatric): Secondary | ICD-10-CM | POA: Diagnosis not present

## 2021-07-04 DIAGNOSIS — Z7901 Long term (current) use of anticoagulants: Secondary | ICD-10-CM | POA: Diagnosis not present

## 2021-07-08 DIAGNOSIS — R0683 Snoring: Secondary | ICD-10-CM | POA: Diagnosis not present

## 2021-07-08 DIAGNOSIS — G4733 Obstructive sleep apnea (adult) (pediatric): Secondary | ICD-10-CM | POA: Diagnosis not present

## 2021-07-13 ENCOUNTER — Other Ambulatory Visit (HOSPITAL_COMMUNITY): Payer: Self-pay

## 2021-07-16 DIAGNOSIS — G4733 Obstructive sleep apnea (adult) (pediatric): Secondary | ICD-10-CM | POA: Diagnosis not present

## 2021-07-18 ENCOUNTER — Other Ambulatory Visit (HOSPITAL_COMMUNITY): Payer: Self-pay

## 2021-07-18 DIAGNOSIS — F330 Major depressive disorder, recurrent, mild: Secondary | ICD-10-CM | POA: Diagnosis not present

## 2021-07-18 DIAGNOSIS — F0632 Mood disorder due to known physiological condition with major depressive-like episode: Secondary | ICD-10-CM | POA: Diagnosis not present

## 2021-07-18 MED ORDER — BUPROPION HCL ER (XL) 150 MG PO TB24
450.0000 mg | ORAL_TABLET | Freq: Every morning | ORAL | 2 refills | Status: DC
  Filled 2021-07-22: qty 90, 30d supply, fill #0
  Filled 2021-08-18: qty 90, 30d supply, fill #1
  Filled 2021-09-15: qty 90, 30d supply, fill #2

## 2021-07-18 MED ORDER — ARIPIPRAZOLE 5 MG PO TABS
5.0000 mg | ORAL_TABLET | Freq: Every morning | ORAL | 4 refills | Status: DC
  Filled 2021-07-18 – 2021-09-15 (×2): qty 90, 90d supply, fill #0

## 2021-07-22 ENCOUNTER — Other Ambulatory Visit (HOSPITAL_COMMUNITY): Payer: Self-pay
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
## 2021-07-25 ENCOUNTER — Other Ambulatory Visit: Payer: Self-pay

## 2021-07-25 ENCOUNTER — Inpatient Hospital Stay (HOSPITAL_BASED_OUTPATIENT_CLINIC_OR_DEPARTMENT_OTHER): Payer: PPO | Admitting: Physician Assistant

## 2021-07-25 ENCOUNTER — Other Ambulatory Visit (HOSPITAL_COMMUNITY): Payer: Self-pay | Admitting: Psychiatry

## 2021-07-25 ENCOUNTER — Inpatient Hospital Stay: Payer: PPO

## 2021-07-25 ENCOUNTER — Inpatient Hospital Stay: Payer: PPO | Attending: Oncology

## 2021-07-25 VITALS — BP 129/68 | HR 66 | Temp 98.0°F | Resp 17

## 2021-07-25 VITALS — BP 127/66 | HR 64 | Temp 97.8°F | Resp 17 | Ht 69.5 in | Wt 180.6 lb

## 2021-07-25 DIAGNOSIS — C641 Malignant neoplasm of right kidney, except renal pelvis: Secondary | ICD-10-CM | POA: Insufficient documentation

## 2021-07-25 DIAGNOSIS — C772 Secondary and unspecified malignant neoplasm of intra-abdominal lymph nodes: Secondary | ICD-10-CM | POA: Diagnosis not present

## 2021-07-25 DIAGNOSIS — Z7901 Long term (current) use of anticoagulants: Secondary | ICD-10-CM | POA: Diagnosis not present

## 2021-07-25 DIAGNOSIS — Z86711 Personal history of pulmonary embolism: Secondary | ICD-10-CM | POA: Diagnosis not present

## 2021-07-25 DIAGNOSIS — Z5112 Encounter for antineoplastic immunotherapy: Secondary | ICD-10-CM

## 2021-07-25 DIAGNOSIS — Z79899 Other long term (current) drug therapy: Secondary | ICD-10-CM | POA: Insufficient documentation

## 2021-07-25 DIAGNOSIS — E079 Disorder of thyroid, unspecified: Secondary | ICD-10-CM | POA: Insufficient documentation

## 2021-07-25 DIAGNOSIS — K529 Noninfective gastroenteritis and colitis, unspecified: Secondary | ICD-10-CM | POA: Insufficient documentation

## 2021-07-25 DIAGNOSIS — Z86718 Personal history of other venous thrombosis and embolism: Secondary | ICD-10-CM | POA: Insufficient documentation

## 2021-07-25 DIAGNOSIS — L309 Dermatitis, unspecified: Secondary | ICD-10-CM | POA: Diagnosis not present

## 2021-07-25 DIAGNOSIS — C649 Malignant neoplasm of unspecified kidney, except renal pelvis: Secondary | ICD-10-CM

## 2021-07-25 LAB — CBC WITH DIFFERENTIAL (CANCER CENTER ONLY)
Abs Immature Granulocytes: 0.02 10*3/uL (ref 0.00–0.07)
Basophils Absolute: 0.1 10*3/uL (ref 0.0–0.1)
Basophils Relative: 1 %
Eosinophils Absolute: 0 10*3/uL (ref 0.0–0.5)
Eosinophils Relative: 0 %
HCT: 46.2 % (ref 39.0–52.0)
Hemoglobin: 15.9 g/dL (ref 13.0–17.0)
Immature Granulocytes: 0 %
Lymphocytes Relative: 22 %
Lymphs Abs: 1.7 10*3/uL (ref 0.7–4.0)
MCH: 28 pg (ref 26.0–34.0)
MCHC: 34.4 g/dL (ref 30.0–36.0)
MCV: 81.3 fL (ref 80.0–100.0)
Monocytes Absolute: 1 10*3/uL (ref 0.1–1.0)
Monocytes Relative: 13 %
Neutro Abs: 4.9 10*3/uL (ref 1.7–7.7)
Neutrophils Relative %: 64 %
Platelet Count: 141 10*3/uL — ABNORMAL LOW (ref 150–400)
RBC: 5.68 MIL/uL (ref 4.22–5.81)
RDW: 15.4 % (ref 11.5–15.5)
WBC Count: 7.6 10*3/uL (ref 4.0–10.5)
nRBC: 0 % (ref 0.0–0.2)

## 2021-07-25 LAB — CMP (CANCER CENTER ONLY)
ALT: 21 U/L (ref 0–44)
AST: 23 U/L (ref 15–41)
Albumin: 3.6 g/dL (ref 3.5–5.0)
Alkaline Phosphatase: 70 U/L (ref 38–126)
Anion gap: 6 (ref 5–15)
BUN: 15 mg/dL (ref 8–23)
CO2: 25 mmol/L (ref 22–32)
Calcium: 9 mg/dL (ref 8.9–10.3)
Chloride: 108 mmol/L (ref 98–111)
Creatinine: 1.19 mg/dL (ref 0.61–1.24)
GFR, Estimated: >60 mL/min (ref >60)
Glucose, Bld: 101 mg/dL — ABNORMAL HIGH (ref 70–99)
Potassium: 3.8 mmol/L (ref 3.5–5.1)
Sodium: 139 mmol/L (ref 135–145)
Total Bilirubin: 0.6 mg/dL (ref 0.3–1.2)
Total Protein: 6 g/dL — ABNORMAL LOW (ref 6.5–8.1)

## 2021-07-25 LAB — TSH: TSH: 1.513 u[IU]/mL (ref 0.350–4.500)

## 2021-07-25 MED ORDER — SODIUM CHLORIDE 0.9 % IV SOLN: Freq: Once | INTRAVENOUS | Status: AC

## 2021-07-25 MED ORDER — SODIUM CHLORIDE 0.9 % IV SOLN
480.0000 mg | Freq: Once | INTRAVENOUS | Status: AC
  Administered 2021-07-25: 480 mg via INTRAVENOUS
  Filled 2021-07-25: qty 48

## 2021-07-25 NOTE — Patient Instructions (Signed)
[facility]   Discharge Instructions: Thank you for choosing [facility] to provide your oncology and hematology care.   If you have a lab appointment with the [facility], please go directly to the [facility] and check in at the registration area.   Wear comfortable clothing and clothing appropriate for easy access to any Portacath or PICC line.   We strive to give you quality time with your provider. You may need to reschedule your appointment if you arrive late (15 or more minutes).  Arriving late affects you and other patients whose appointments are after yours.  Also, if you miss three or more appointments without notifying the office, you may be dismissed from the clinic at the provider's discretion.      For prescription refill requests, have your pharmacy contact our office and allow 72 hours for refills to be completed.    Today you received the following chemotherapy and/or immunotherapy agents: nivolumab      To help prevent nausea and vomiting after your treatment, we encourage you to take your nausea medication as directed.  BELOW ARE SYMPTOMS THAT SHOULD BE REPORTED IMMEDIATELY: *FEVER GREATER THAN 100.4 F (38 C) OR HIGHER *CHILLS OR SWEATING *NAUSEA AND VOMITING THAT IS NOT CONTROLLED WITH YOUR NAUSEA MEDICATION *UNUSUAL SHORTNESS OF BREATH *UNUSUAL BRUISING OR BLEEDING *URINARY PROBLEMS (pain or burning when urinating, or frequent urination) *BOWEL PROBLEMS (unusual diarrhea, constipation, pain near the anus) TENDERNESS IN MOUTH AND THROAT WITH OR WITHOUT PRESENCE OF ULCERS (sore throat, sores in mouth, or a toothache) UNUSUAL RASH, SWELLING OR PAIN  UNUSUAL VAGINAL DISCHARGE OR ITCHING   Items with * indicate a potential emergency and should be followed up as soon as possible or go to the Emergency Department if any problems should occur.  Please show the CHEMOTHERAPY ALERT CARD or IMMUNOTHERAPY ALERT CARD at check-in  to the Emergency Department and triage nurse.  Should you have questions after your visit or need to cancel or reschedule your appointment, please contact [facility]  Dept: [phone]  and follow the prompts.  Office hours are 8:00 a.m. to 4:30 p.m. Monday - Friday. Please note that voicemails left after 4:00 p.m. may not be returned until the following business day.  We are closed weekends and major holidays. You have access to a nurse at all times for urgent questions. Please call the main number to the clinic Dept: [phone] and follow the prompts.   For any non-urgent questions, you may also contact your provider using MyChart. We now offer e-Visits for anyone 2 and older to request care online for non-urgent symptoms. For details visit [email or web address].   Also download the MyChart app! Go to the app store, search "MyChart", open the app, select [facility], and log in with your MyChart username and password.  Nivolumab injection What is this medication? NIVOLUMAB (nye VOL ue mab) is a monoclonal antibody. It treats certain types of cancer. Some of the cancers treated are colon cancer, head and neck cancer, Hodgkin lymphoma, lung cancer, and melanoma. This medicine may be used for other purposes; ask your health care provider or pharmacist if you have questions. COMMON BRAND NAME(S): Opdivo What should I tell my care team before I take this medication? They need to know if you have any of these conditions: Autoimmune diseases such as Crohn's disease, ulcerative colitis, or lupus Have had or planning to have an allogeneic stem cell transplant (uses someone else's  stem cells) History of chest radiation Organ transplant Nervous system problems such as myasthenia gravis or Guillain-Barre syndrome An unusual or allergic reaction to nivolumab, other medicines, foods, dyes, or preservatives Pregnant or trying to get pregnant Breast-feeding How should I  use this medication? This medication is injected into a vein. It is given in a hospital or clinic setting. A special MedGuide will be given to you before each treatment. Be sure to read this information carefully each time. Talk to your care team regarding the use of this medication in children. While it may be prescribed for children as young as 12 years for selected conditions, precautions do apply. Overdosage: If you think you have taken too much of this medicine contact a poison control center or emergency room at once. NOTE: This medicine is only for you. Do not share this medicine with others. What if I miss a dose? Keep appointments for follow-up doses. It is important not to miss your dose. Call your care team if you are unable to keep an appointment. What may interact with this medication? Interactions have not been studied. This list may not describe all possible interactions. Give your health care provider a list of all the medicines, herbs, non-prescription drugs, or dietary supplements you use. Also tell them if you smoke, drink alcohol, or use illegal drugs. Some items may interact with your medicine. What should I watch for while using this medication? Your condition will be monitored carefully while you are receiving this medication. You may need blood work done while you are taking this medication. Do not become pregnant while taking this medication or for 5 months after stopping it. Women should inform their care team if they wish to become pregnant or think they might be pregnant. There is a potential for serious harm to an unborn child. Talk to your care team for more information. Do not breast-feed an infant while taking this medication or for 5 months after stopping it. What side effects may I notice from receiving this medication? Side effects that you should report to your care team as soon as possible: Allergic reactions--skin rash, itching, hives, swelling of the face,  lips, tongue, or throat Bloody or black, tar-like stools Change in vision Chest pain Diarrhea Dry cough, shortness of breath or trouble breathing Eye pain Fast or irregular heartbeat Fever, chills High blood sugar (hyperglycemia)--increased thirst or amount of urine, unusual weakness or fatigue, blurry vision High thyroid levels (hyperthyroidism)--fast or irregular heartbeat, weight loss, excessive sweating or sensitivity to heat, tremors or shaking, anxiety, nervousness, irregular menstrual cycle or spotting Kidney injury--decrease in the amount of urine, swelling of the ankles, hands, or feet Liver injury--right upper belly pain, loss of appetite, nausea, light-colored stool, dark yellow or brown urine, yellowing skin or eyes, unusual weakness or fatigue Low red blood cell count--unusual weakness or fatigue, dizziness, headache, trouble breathing Low thyroid levels (hypothyroidism)--unusual weakness or fatigue, increased sensitivity to cold, constipation, hair loss, dry skin, weight gain, feelings of depression Mood and behavior changes-confusion, change in sex drive or performance, irritability Muscle pain or cramps Pain, tingling, or numbness in the hands or feet, muscle weakness, trouble walking, loss of balance or coordination Red or dark brown urine Redness, blistering, peeling, or loosening of the skin, including inside the mouth Stomach pain Unusual bruising or bleeding Side effects that usually do not require medical attention (report to your care team if they continue or are bothersome): Bone pain Constipation Loss of appetite Nausea Tiredness Vomiting This list  may not describe all possible side effects. Call your doctor for medical advice about side effects. You may report side effects to FDA at 1-800-FDA-1088. Where should I keep my medication? This medication is given in a hospital or clinic and will not be stored at home. NOTE: This sheet is a summary. It may not cover  all possible information. If you have questions about this medicine, talk to your doctor, pharmacist, or health care provider.  2023 Elsevier/Gold Standard (2020-11-23 00:00:00)

## 2021-07-26 ENCOUNTER — Encounter: Payer: Self-pay | Admitting: Oncology

## 2021-07-26 ENCOUNTER — Other Ambulatory Visit (HOSPITAL_COMMUNITY): Payer: Self-pay

## 2021-07-26 MED ORDER — CLINDAMYCIN HCL 150 MG PO CAPS
150.0000 mg | ORAL_CAPSULE | Freq: Four times a day (QID) | ORAL | 0 refills | Status: DC
  Filled 2021-07-26: qty 30, 8d supply, fill #0

## 2021-07-26 MED ORDER — SIMVASTATIN 20 MG PO TABS
ORAL_TABLET | ORAL | 0 refills | Status: DC
  Filled 2021-07-26: qty 90, 90d supply, fill #0

## 2021-07-29 ENCOUNTER — Other Ambulatory Visit: Payer: Self-pay

## 2021-08-02 DIAGNOSIS — Z7901 Long term (current) use of anticoagulants: Secondary | ICD-10-CM | POA: Diagnosis not present

## 2021-08-07 ENCOUNTER — Other Ambulatory Visit (HOSPITAL_COMMUNITY): Payer: Self-pay

## 2021-08-08 DIAGNOSIS — R0683 Snoring: Secondary | ICD-10-CM | POA: Diagnosis not present

## 2021-08-08 DIAGNOSIS — G4733 Obstructive sleep apnea (adult) (pediatric): Secondary | ICD-10-CM | POA: Diagnosis not present

## 2021-08-11 ENCOUNTER — Other Ambulatory Visit (HOSPITAL_COMMUNITY): Payer: Self-pay | Admitting: Psychiatry

## 2021-08-11 ENCOUNTER — Other Ambulatory Visit: Payer: Self-pay

## 2021-08-11 ENCOUNTER — Other Ambulatory Visit (HOSPITAL_COMMUNITY): Payer: Self-pay

## 2021-08-12 ENCOUNTER — Other Ambulatory Visit (HOSPITAL_COMMUNITY): Payer: Self-pay

## 2021-08-12 MED ORDER — WARFARIN SODIUM 5 MG PO TABS
ORAL_TABLET | ORAL | 3 refills | Status: DC
  Filled 2021-08-12: qty 96, 84d supply, fill #0

## 2021-08-12 MED ORDER — SIMVASTATIN 20 MG PO TABS
ORAL_TABLET | ORAL | 4 refills | Status: DC
  Filled 2021-08-12: qty 90, 90d supply, fill #0

## 2021-08-14 ENCOUNTER — Other Ambulatory Visit: Payer: Self-pay | Admitting: Oncology

## 2021-08-14 ENCOUNTER — Telehealth: Payer: Self-pay

## 2021-08-14 ENCOUNTER — Other Ambulatory Visit (HOSPITAL_COMMUNITY): Payer: Self-pay

## 2021-08-14 MED ORDER — DIPHENOXYLATE-ATROPINE 2.5-0.025 MG PO TABS
1.0000 | ORAL_TABLET | Freq: Four times a day (QID) | ORAL | 0 refills | Status: DC | PRN
  Filled 2021-08-14: qty 30, 8d supply, fill #0

## 2021-08-14 NOTE — Telephone Encounter (Addendum)
Wife called to say that patient has been experiencing loose watery stools, 5-6 daily,for the last ten days. Patient has taken Imodium as recommended by [facility]. He is also taking Simethicone.   Routed top provider.  Spoke to pt's wife to relay that prescription for Lomotil had been sent to patient's pharmacy. Encouraged her to call with questions or concerns.

## 2021-08-15 ENCOUNTER — Other Ambulatory Visit (HOSPITAL_COMMUNITY): Payer: PPO

## 2021-08-15 ENCOUNTER — Ambulatory Visit (HOSPITAL_COMMUNITY): Payer: PPO

## 2021-08-16 ENCOUNTER — Encounter (HOSPITAL_COMMUNITY): Payer: Self-pay

## 2021-08-16 ENCOUNTER — Emergency Department (HOSPITAL_COMMUNITY)
Admission: RE | Admit: 2021-08-16 | Discharge: 2021-08-16 | Disposition: A | Discharge status: Home / Self Care | Payer: PPO | Source: Ambulatory Visit | Attending: Oncology | Admitting: Oncology

## 2021-08-16 ENCOUNTER — Other Ambulatory Visit: Payer: Self-pay | Admitting: Oncology

## 2021-08-16 ENCOUNTER — Ambulatory Visit (HOSPITAL_COMMUNITY)
Admission: RE | Admit: 2021-08-16 | Discharge: 2021-08-16 | Disposition: A | Discharge status: Home / Self Care | Payer: PPO | Source: Ambulatory Visit | Attending: Oncology | Admitting: Oncology

## 2021-08-16 ENCOUNTER — Telehealth: Payer: Self-pay

## 2021-08-16 ENCOUNTER — Other Ambulatory Visit: Payer: Self-pay

## 2021-08-16 ENCOUNTER — Emergency Department (HOSPITAL_COMMUNITY)
Admission: EM | Admit: 2021-08-16 | Discharge: 2021-08-16 | Disposition: A | Discharge status: Home / Self Care | Payer: PPO | Attending: Emergency Medicine | Admitting: Emergency Medicine

## 2021-08-16 DIAGNOSIS — C641 Malignant neoplasm of right kidney, except renal pelvis: Secondary | ICD-10-CM | POA: Insufficient documentation

## 2021-08-16 DIAGNOSIS — Z7901 Long term (current) use of anticoagulants: Secondary | ICD-10-CM | POA: Diagnosis not present

## 2021-08-16 DIAGNOSIS — K7460 Unspecified cirrhosis of liver: Secondary | ICD-10-CM | POA: Diagnosis not present

## 2021-08-16 DIAGNOSIS — N200 Calculus of kidney: Secondary | ICD-10-CM | POA: Diagnosis not present

## 2021-08-16 DIAGNOSIS — J439 Emphysema, unspecified: Secondary | ICD-10-CM | POA: Diagnosis not present

## 2021-08-16 DIAGNOSIS — C7900 Secondary malignant neoplasm of unspecified kidney and renal pelvis: Secondary | ICD-10-CM | POA: Diagnosis not present

## 2021-08-16 DIAGNOSIS — Z8553 Personal history of malignant neoplasm of renal pelvis: Secondary | ICD-10-CM | POA: Insufficient documentation

## 2021-08-16 DIAGNOSIS — R197 Diarrhea, unspecified: Secondary | ICD-10-CM

## 2021-08-16 DIAGNOSIS — K5730 Diverticulosis of large intestine without perforation or abscess without bleeding: Secondary | ICD-10-CM | POA: Diagnosis not present

## 2021-08-16 HISTORY — DX: Malignant (primary) neoplasm, unspecified: C80.1

## 2021-08-16 LAB — COMPREHENSIVE METABOLIC PANEL
ALT: 23 U/L (ref 0–44)
AST: 27 U/L (ref 15–41)
Albumin: 3.4 g/dL — ABNORMAL LOW (ref 3.5–5.0)
Alkaline Phosphatase: 69 U/L (ref 38–126)
Anion gap: 7 (ref 5–15)
BUN: 11 mg/dL (ref 8–23)
CO2: 22 mmol/L (ref 22–32)
Calcium: 9 mg/dL (ref 8.9–10.3)
Chloride: 107 mmol/L (ref 98–111)
Creatinine, Ser: 1.17 mg/dL (ref 0.61–1.24)
GFR, Estimated: >60 mL/min (ref >60)
Glucose, Bld: 91 mg/dL (ref 70–99)
Potassium: 3.1 mmol/L — ABNORMAL LOW (ref 3.5–5.1)
Sodium: 136 mmol/L (ref 135–145)
Total Bilirubin: 0.7 mg/dL (ref 0.3–1.2)
Total Protein: 6.2 g/dL — ABNORMAL LOW (ref 6.5–8.1)

## 2021-08-16 LAB — URINALYSIS, ROUTINE W REFLEX MICROSCOPIC
Bacteria, UA: NONE SEEN
Bilirubin Urine: NEGATIVE
Glucose, UA: NEGATIVE mg/dL
Hgb urine dipstick: NEGATIVE
Ketones, ur: NEGATIVE mg/dL
Nitrite: NEGATIVE
Protein, ur: NEGATIVE mg/dL
Specific Gravity, Urine: 1.028 (ref 1.005–1.030)
pH: 6 (ref 5.0–8.0)

## 2021-08-16 LAB — CBC WITH DIFFERENTIAL/PLATELET
Abs Immature Granulocytes: 0.03 10*3/uL (ref 0.00–0.07)
Basophils Absolute: 0.1 10*3/uL (ref 0.0–0.1)
Basophils Relative: 1 %
Eosinophils Absolute: 0 10*3/uL (ref 0.0–0.5)
Eosinophils Relative: 0 %
HCT: 45.2 % (ref 39.0–52.0)
Hemoglobin: 15.5 g/dL (ref 13.0–17.0)
Immature Granulocytes: 0 %
Lymphocytes Relative: 20 %
Lymphs Abs: 1.6 10*3/uL (ref 0.7–4.0)
MCH: 28.3 pg (ref 26.0–34.0)
MCHC: 34.3 g/dL (ref 30.0–36.0)
MCV: 82.6 fL (ref 80.0–100.0)
Monocytes Absolute: 1 10*3/uL (ref 0.1–1.0)
Monocytes Relative: 12 %
Neutro Abs: 5.3 10*3/uL (ref 1.7–7.7)
Neutrophils Relative %: 67 %
Platelets: 155 10*3/uL (ref 150–400)
RBC: 5.47 MIL/uL (ref 4.22–5.81)
RDW: 15.5 % (ref 11.5–15.5)
WBC: 8.1 10*3/uL (ref 4.0–10.5)
nRBC: 0 % (ref 0.0–0.2)

## 2021-08-16 LAB — LIPASE, BLOOD: Lipase: 28 U/L (ref 11–51)

## 2021-08-16 LAB — C DIFFICILE QUICK SCREEN W PCR REFLEX
C Diff antigen: NEGATIVE
C Diff interpretation: NOT DETECTED
C Diff toxin: NEGATIVE

## 2021-08-16 MED ORDER — SODIUM CHLORIDE (PF) 0.9 % IJ SOLN
INTRAMUSCULAR | Status: AC
  Filled 2021-08-16: qty 50

## 2021-08-16 MED ORDER — IOHEXOL 9 MG/ML PO SOLN
ORAL | Status: AC
  Filled 2021-08-16: qty 1000

## 2021-08-16 MED ORDER — IOHEXOL 300 MG/ML  SOLN
100.0000 mL | Freq: Once | INTRAMUSCULAR | Status: AC | PRN
  Administered 2021-08-16: 100 mL via INTRAVENOUS

## 2021-08-16 MED ORDER — SODIUM CHLORIDE 0.9 % IV BOLUS
1000.0000 mL | Freq: Once | INTRAVENOUS | Status: AC
  Administered 2021-08-16: 1000 mL via INTRAVENOUS

## 2021-08-16 MED ORDER — POTASSIUM CHLORIDE CRYS ER 20 MEQ PO TBCR: 20.0000 meq | EXTENDED_RELEASE_TABLET | Freq: Two times a day (BID) | ORAL | 0 refills | Status: DC

## 2021-08-16 MED ORDER — IOHEXOL 9 MG/ML PO SOLN
1000.0000 mL | ORAL | Status: AC
  Administered 2021-08-16: 1000 mL via ORAL

## 2021-08-16 NOTE — ED Triage Notes (Signed)
Patient's wife reports that the patient has had diarrhea x 11 days. Patient was prescribed Lomotil and patient's wife reports that the diarrhea has a foul odor.. Patient had a CTScan today and after the CT Scan diarrheal episodes increased. Patient was referred to the ED.

## 2021-08-16 NOTE — Discharge Instructions (Addendum)
You were seen in the emergency department for persistent diarrhea.  We have reviewed your labs and your imaging from earlier today, and they all look reassuring. Your stool was negative for C. Difficile. We have sent test for several gastrointestinal viruses, and the results should be available in a few days.   I'm not quite sure of the etiology of your diarrhea, however, you are not requiring admission to the hospital.  I recommend continuing good oral hydration at home with lomotil and/or imodium as needed. Your potassium level was slightly low, so I'm sending some replacement to your pharmacy.   I've also attached the contact information for the GI doctor for you to call and make a follow up appointment. Please follow up with your oncologist and return to the ER for any new or worsening symptoms.

## 2021-08-16 NOTE — Telephone Encounter (Signed)
Wife called to say that patient's diarrhea has progressed even after start of Lomotil. Yellow watery stools with strong odor. Wife is a Marine scientist and states that she is concerned for C-diff. Encouraged pt to present to ED for evaluation.

## 2021-08-16 NOTE — ED Provider Triage Note (Signed)
Emergency Medicine Provider Triage Evaluation Note  Tyler Mason , a 71 y.o. male  was evaluated in triage.  Pt complains of diarrhea for 11 days.  Patient is accompanied by wife who is COVID historian.  Patient has known kidney cancer is receiving immunotherapy.  He has tried at home Imodium and Lomatuell with no relief of symptoms.  He denies hematochezia, melena, abdominal pain, urinary symptoms, fever.  Associated symptoms include nausea.  Wife is try to alter diet to include liquids only but this is not help patient's symptoms.  He recently had CT chest as well as abdomen pelvis earlier today.  Wife states that bowel movements have been malodorous but denies recent antibiotic use.  Review of Systems  Positive: See above Negative:   Physical Exam  BP (!) 146/89 (BP Location: Left Arm)   Pulse 87   Temp 98 F (36.7 C) (Oral)   Resp 18   Ht '5\' 10"'$  (1.778 m)   Wt 81.6 kg   SpO2 97%   BMI 25.83 kg/m  Gen:   Awake, no distress   Resp:  Normal effort  MSK:   Moves extremities without difficulty  Other:  No tenderness to palpation of the abdomen  Medical Decision Making  Medically screening exam initiated at 4:27 PM.  Appropriate orders placed.  Tyler Mason was informed that the remainder of the evaluation will be completed by another provider, this initial triage assessment does not replace that evaluation, and the importance of remaining in the ED until their evaluation is complete.     Tyler Mason, Tyler Mason 08/16/21 [phone]

## 2021-08-16 NOTE — ED Provider Notes (Signed)
[facility] DEPT Provider Note   CSN: [id number] Arrival date & time: 08/16/21  1602     History  Chief Complaint  Patient presents with   chemo patient   Diarrhea    Tyler Mason is a 71 y.o. male who presents the emergency department complaining of persistent diarrhea for the past 11 days.  Patient with known renal cancer and was having a routine CT scan today, when his wife noticed that his diarrheal episodes got worse after the scan.  He was encouraged to come to the ER for evaluation.  Wife states that she has been giving him Imodium and Lomotil, and is noticed that his diarrhea now has a foul odor, she is concerned for C. difficile.  Patient states that earlier today he was having diarrhea every 5 minutes.  He has not had a normal bowel movement in the past 11 days.  He does not have any pain with these episodes.  He is on warfarin, but has not noticed any dark or bloody stools.  States he has been somewhat nauseous, and feels dehydrated.  His wife has been trying to give him small amounts of liquids, but thinks this exacerbated his symptoms.  Diarrhea Associated symptoms: no abdominal pain, no chills, no fever and no vomiting        Home Medications Prior to Admission medications   Medication Sig Start Date End Date Taking? Authorizing Provider  potassium chloride SA (KLOR-CON M) 20 MEQ tablet Take 1 tablet (20 mEq total) by mouth 2 (two) times daily for 3 days. 08/16/21 08/19/21 Yes Ayn Domangue T, PA-C  acetaminophen (TYLENOL) 325 MG tablet Take 2 tablets (650 mg total) by mouth every 4 (four) hours as needed for mild pain. 12/28/20   [name], MD  ARIPiprazole (ABILIFY) 5 MG tablet TAKE 1 TABLET BY MOUTH ONCE DAILY IN THE MORNING. 01/31/20 03/24/21  Plovsky, [name], MD  ARIPiprazole (ABILIFY) 5 MG tablet Take 1 tablet by mouth every morning 03/28/21     ARIPiprazole (ABILIFY) 5 MG tablet Take 1 tablet by mouth every morning. 07/18/21      buPROPion (WELLBUTRIN XL) 150 MG 24 hr tablet Take 3 tablets (450 mg total) by mouth in the morning. 07/18/21     buPROPion (WELLBUTRIN XL) 300 MG 24 hr tablet Take 1 tablet (300 mg total) by mouth every morning. 10/31/20     buPROPion (WELLBUTRIN XL) 300 MG 24 hr tablet Take 1 tablet by mouth every morning 03/28/21     clindamycin (CLEOCIN) 150 MG capsule Take 1 capsule (150 mg total) by mouth 4 (four) times daily until gone, start 2 days before surgery 07/23/21   Dorna Bloom, DDS  diphenoxylate-atropine (LOMOTIL) 2.5-0.025 MG tablet Take 1 tablet by mouth 4 (four) times daily as needed for diarrhea or loose stools. 08/14/21   Wyatt Portela, MD  donepezil (ARICEPT) 10 MG tablet Take 1 tablet daily 06/18/21   Rondel Jumbo, PA-C  doxycycline (VIBRA-TABS) 100 MG tablet Take one tablet by mouth twice daily for 5 days 05/20/21   [name], MD  ibuprofen (ADVIL) 400 MG tablet Take 1 tablet by mouth every 4  hours as needed for moderate pain. Patient not taking: Reported on 06/18/2021 12/28/20   [name], MD  levothyroxine (SYNTHROID) 75 MCG tablet Take 1 tablet by mouth every morning on an empty stomach 05/27/21     Melatonin 5 MG CAPS Take by mouth at bedtime.    [provider]  pantoprazole (PROTONIX) 40 MG tablet Take 1 tablet (40 mg total) by mouth daily. 07/01/21     prochlorperazine (COMPAZINE) 10 MG tablet Take 1 tablet (10 mg total) by mouth every 6 (six) hours as needed for nausea or vomiting. 01/31/21   Wyatt Portela, MD  simvastatin (ZOCOR) 20 MG tablet TAKE 1 TABLET BY MOUTH ONCE DAILY 03/27/20 03/27/21  [name], MD  simvastatin (ZOCOR) 20 MG tablet TAKE 1 TABLET BY MOUTH ONCE DAILY 03/11/21     simvastatin (ZOCOR) 20 MG tablet TAKE 1 TABLET BY MOUTH ONCE DAILY 90 days 07/26/21     simvastatin (ZOCOR) 20 MG tablet TAKE 1 TABLET BY MOUTH ONCE DAILY 08/12/21     vitamin B-12 (CYANOCOBALAMIN) 1000 MCG tablet Take 1,000 mcg by mouth daily.    [provider]   warfarin (COUMADIN) 5 MG tablet Take 1 and 1/2 tablets by mouth on Friday and Sunday. Take 1 tablet by mouth on all other days. 08/12/21         Allergies    Ampicillin and Macrodantin [nitrofurantoin macrocrystal]    Review of Systems   Review of Systems  Constitutional:  Negative for chills and fever.  Gastrointestinal:  Positive for diarrhea and nausea. Negative for abdominal distention, abdominal pain, blood in stool, rectal pain and vomiting.  Genitourinary:  Negative for dysuria.  All other systems reviewed and are negative.   Physical Exam Updated Vital Signs BP 137/73   Pulse 65   Temp 97.6 F (36.4 C) (Oral)   Resp 14   Ht '5\' 10"'$  (1.778 m)   Wt 81.6 kg   SpO2 96%   BMI 25.83 kg/m  Physical Exam Vitals and nursing note reviewed.  Constitutional:      Appearance: Normal appearance.     Comments: Appears fatigued  HENT:     Head: Normocephalic and atraumatic.  Eyes:     Conjunctiva/sclera: Conjunctivae normal.  Cardiovascular:     Rate and Rhythm: Normal rate and regular rhythm.  Pulmonary:     Effort: Pulmonary effort is normal. No respiratory distress.     Breath sounds: Normal breath sounds.  Abdominal:     General: There is no distension.     Palpations: Abdomen is soft.     Tenderness: There is no abdominal tenderness.  Skin:    General: Skin is warm and dry.  Neurological:     General: No focal deficit present.     Mental Status: He is alert.     ED Results / Procedures / Treatments   Labs (all labs ordered are listed, but only abnormal results are displayed) Labs Reviewed  COMPREHENSIVE METABOLIC PANEL - Abnormal; Notable for the following components:      Result Value   Potassium 3.1 (*)    Total Protein 6.2 (*)    Albumin 3.4 (*)    All other components within normal limits  URINALYSIS, ROUTINE W REFLEX MICROSCOPIC - Abnormal; Notable for the following components:   Leukocytes,Ua MODERATE (*)    All other components within normal limits   C DIFFICILE QUICK SCREEN W PCR REFLEX    GASTROINTESTINAL PANEL BY PCR, STOOL (REPLACES STOOL CULTURE)  LIPASE, BLOOD  CBC WITH DIFFERENTIAL/PLATELET    EKG None  Radiology CT Abdomen Pelvis W Wo Contrast  Result Date: 08/16/2021 CLINICAL DATA:  Follow-up metastatic renal cell carcinoma. Cirrhosis. * Tracking Code: BO * EXAM: CT CHEST WITH CONTRAST CT ABDOMEN AND PELVIS WITH AND WITHOUT CONTRAST TECHNIQUE: Multidetector CT imaging of  the chest was performed during intravenous contrast administration. Multidetector CT imaging of the abdomen and pelvis was performed following the standard protocol before and during bolus administration of intravenous contrast. RADIATION DOSE REDUCTION: This exam was performed according to the departmental dose-optimization program which includes automated exposure control, adjustment of the mA and/or kV according to patient size and/or use of iterative reconstruction technique. CONTRAST:  153m OMNIPAQUE IOHEXOL 300 MG/ML  SOLN COMPARISON:  CT on 04/26/2021 and MRI on 12/27/2020 FINDINGS: CT CHEST FINDINGS Cardiovascular: No acute findings. Aortic and coronary atherosclerotic calcification incidentally noted. 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 Mediastinum/Lymph Nodes: No masses or pathologically enlarged lymph nodes identified. Lungs/Pleura: Mild paraseptal emphysema again noted. No suspicious pulmonary nodules or masses identified. No evidence of infiltrate or pleural effusion. Musculoskeletal: No suspicious bone lesions identified. Old midthoracic vertebral body compression fracture again noted. CT ABDOMEN AND PELVIS FINDINGS Hepatobiliary: Hepatic cirrhosis is again demonstrated. No masses identified. Gallbladder is unremarkable. No evidence of biliary ductal dilatation. Pancreas:  No mass or inflammatory changes. Spleen:  Within normal limits in size and appearance. Adrenals/Urinary tract: A few renal calculi are again seen  bilaterally, largest in lower pole right kidney measuring 1.1 cm. No ureteral calculi or hydronephrosis. Several small benign-appearing bilateral renal cysts remains stable. Previously seen small mass in the medial upper pole of the right kidney is no longer visualized. No other renal masses are identified. Stomach/Bowel: Tiny hiatal hernia again noted. No evidence of obstruction, inflammatory process, or abnormal fluid collections. Normal appendix visualized. Mild sigmoid diverticulosis noted, without evidence of diverticulitis. Vascular/Lymphatic: 1.3 cm retroperitoneal lymph node in the retrocaval space on image 52/11 shows no significant change compared to prior study. No other pathologically enlarged lymph nodes identified within the abdomen or pelvis. No acute vascular findings. Aortic atherosclerotic calcification incidentally noted. Reproductive:  No mass or other significant abnormality identified. Other:  None. Musculoskeletal:  No suspicious bone lesions identified. IMPRESSION: Interval resolution of small right renal mass. Stable mild abdominal retroperitoneal lymphadenopathy. No acute findings.  No new or progressive metastatic disease. Bilateral nephrolithiasis. No evidence of ureteral calculi or hydronephrosis. Hepatic cirrhosis. No evidence of hepatic neoplasm. Colonic diverticulosis, without radiographic evidence of diverticulitis. Stable tiny hiatal hernia. Aortic Atherosclerosis (ICD10-I70.0). Electronically Signed   By: JMarlaine HindM.D.   On: 08/16/2021 18:52   CT Chest W Contrast  Result Date: 08/16/2021 CLINICAL DATA:  Follow-up metastatic renal cell carcinoma. Cirrhosis. * Tracking Code: BO * EXAM: CT CHEST WITH CONTRAST CT ABDOMEN AND PELVIS WITH AND WITHOUT CONTRAST TECHNIQUE: Multidetector CT imaging of the chest was performed during intravenous contrast administration. Multidetector CT imaging of the abdomen and pelvis was performed following the standard protocol before and during  bolus administration of intravenous contrast. RADIATION DOSE REDUCTION: This exam was performed according to the departmental dose-optimization program which includes automated exposure control, adjustment of the mA and/or kV according to patient size and/or use of iterative reconstruction technique. CONTRAST:  1031mOMNIPAQUE IOHEXOL 300 MG/ML  SOLN COMPARISON:  CT on 04/26/2021 and MRI on 12/27/2020 FINDINGS: CT CHEST FINDINGS Cardiovascular: No acute findings. Aortic and coronary atherosclerotic calcification incidentally noted. 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 Mediastinum/Lymph Nodes: No masses or pathologically enlarged lymph nodes identified. Lungs/Pleura: Mild paraseptal emphysema again noted. No suspicious pulmonary nodules or masses identified. No evidence of infiltrate or pleural effusion. Musculoskeletal: No suspicious bone lesions identified. Old midthoracic vertebral body compression fracture again noted. CT ABDOMEN AND PELVIS FINDINGS Hepatobiliary: Hepatic  cirrhosis is again demonstrated. No masses identified. Gallbladder is unremarkable. No evidence of biliary ductal dilatation. Pancreas:  No mass or inflammatory changes. Spleen:  Within normal limits in size and appearance. Adrenals/Urinary tract: A few renal calculi are again seen bilaterally, largest in lower pole right kidney measuring 1.1 cm. No ureteral calculi or hydronephrosis. Several small benign-appearing bilateral renal cysts remains stable. Previously seen small mass in the medial upper pole of the right kidney is no longer visualized. No other renal masses are identified. Stomach/Bowel: Tiny hiatal hernia again noted. No evidence of obstruction, inflammatory process, or abnormal fluid collections. Normal appendix visualized. Mild sigmoid diverticulosis noted, without evidence of diverticulitis. Vascular/Lymphatic: 1.3 cm retroperitoneal lymph node in the retrocaval space on image 52/11 shows no  significant change compared to prior study. No other pathologically enlarged lymph nodes identified within the abdomen or pelvis. No acute vascular findings. Aortic atherosclerotic calcification incidentally noted. Reproductive:  No mass or other significant abnormality identified. Other:  None. Musculoskeletal:  No suspicious bone lesions identified. IMPRESSION: Interval resolution of small right renal mass. Stable mild abdominal retroperitoneal lymphadenopathy. No acute findings.  No new or progressive metastatic disease. Bilateral nephrolithiasis. No evidence of ureteral calculi or hydronephrosis. Hepatic cirrhosis. No evidence of hepatic neoplasm. Colonic diverticulosis, without radiographic evidence of diverticulitis. Stable tiny hiatal hernia. Aortic Atherosclerosis (ICD10-I70.0). Electronically Signed   By: Marlaine Hind M.D.   On: 08/16/2021 18:52    Procedures Procedures    Medications Ordered in ED Medications  sodium chloride 0.9 % bolus 1,000 mL (1,000 mLs Intravenous New Bag/Given 08/16/21 2005)    ED Course/ Medical Decision Making/ A&P                           Medical Decision Making  This patient is a 71 y.o. male  who presents to the ED for concern of persistent diarrhea x 11 days.   Differential diagnoses prior to evaluation: The emergent differential diagnosis includes, but is not limited to,  Infectious causes such as: Viral, Bacterial, Parasitic; Toxin. Noninfectious causes such as: GI Bleed, Appendicitis, Mesenteric Ischemia, Diverticulitis, endocrine causes (adrenal, thyroid), Toxicologic exposures, Drug-associated, IBD. This is not an exhaustive differential.   Past Medical History / Co-morbidities: GERD, blood dyscrasia on coumadin, hyperparathyroidism, malignant neoplasm of right kidney with metastasis   Additional history: Chart reviewed. Pertinent results include: CT chest performed earlier today with mild emphysema, no other acute changes.  CT abdomen pelvis  showed interval resolution of small right renal mass, with stable lymphadenopathy.  Bilateral nephrolithiasis, no hydronephrosis.  Stable hepatic cirrhosis, without neoplasm.  Colonic diverticulosis, without diverticulitis.  Physical Exam: Physical exam performed. The pertinent findings include: Normal vital signs.  Patient appears fatigued.  Abdomen soft, nontender. Does not clinically appear dehydrated.   Lab Tests/Imaging studies: I personally interpreted labs/imaging and the pertinent results include: None no leukocytosis, normal hemoglobin.  Potassium 3.1.  Electrolytes, kidney function, liver function grossly within normal limits.  Normal lipase.  Urinalysis with moderate leukocytes, negative bacteria or nitrites.  C. difficile screen negative. GI viral pathogen panel pending.   Medications: I ordered medication including IV fluids.  I have reviewed the patients home medicines and have made adjustments as needed. Patient feels improved after fluids and has not had additional episodes of diarrhea while in the ER.    Disposition: After consideration of the diagnostic results and the patients response to treatment, I feel that emergency department workup does not suggest an  emergent condition requiring admission or immediate intervention beyond what has been performed at this time. He is clinically well appearing and does not meet SIRS. Abdominal exam is non-surgical. Lab work and imaging all reassuring.   The plan is: discharge to home with good oral hydration, continued imodium and/or lomotil, potassium replacement, and follow up with oncologist. Discussed follow up with GI and given contact information. The patient is safe for discharge and has been instructed to return immediately for worsening symptoms, change in symptoms or any other concerns.   I discussed this case with my attending physician Dr. Tamera Punt who cosigned this note including patient's presenting symptoms, physical exam, and  planned diagnostics and interventions. Attending physician stated agreement with plan or made changes to plan which were implemented.         Final Clinical Impression(s) / ED Diagnoses Final diagnoses:  Diarrhea, unspecified type    Rx / DC Orders ED Discharge Orders          Ordered    potassium chloride SA (KLOR-CON M) 20 MEQ tablet  2 times daily        08/16/21 2132           Portions of this report may have been transcribed using voice recognition software. Every effort was made to ensure accuracy; however, inadvertent computerized transcription errors may be present.    Estill Cotta 08/16/21 2135    Malvin Johns, MD 08/16/21 2222

## 2021-08-17 ENCOUNTER — Other Ambulatory Visit (HOSPITAL_COMMUNITY): Payer: Self-pay

## 2021-08-17 LAB — GASTROINTESTINAL PANEL BY PCR, STOOL (REPLACES STOOL CULTURE)

## 2021-08-18 ENCOUNTER — Other Ambulatory Visit: Payer: Self-pay | Admitting: Oncology

## 2021-08-19 ENCOUNTER — Other Ambulatory Visit (HOSPITAL_COMMUNITY): Payer: Self-pay

## 2021-08-22 ENCOUNTER — Other Ambulatory Visit (HOSPITAL_COMMUNITY): Payer: Self-pay

## 2021-08-22 ENCOUNTER — Inpatient Hospital Stay (HOSPITAL_BASED_OUTPATIENT_CLINIC_OR_DEPARTMENT_OTHER): Payer: PPO | Admitting: Oncology

## 2021-08-22 ENCOUNTER — Other Ambulatory Visit: Payer: Self-pay

## 2021-08-22 ENCOUNTER — Inpatient Hospital Stay: Payer: PPO

## 2021-08-22 ENCOUNTER — Inpatient Hospital Stay: Payer: PPO | Attending: Oncology

## 2021-08-22 ENCOUNTER — Telehealth: Payer: Self-pay | Admitting: *Deleted

## 2021-08-22 VITALS — BP 144/74 | HR 73 | Temp 98.1°F | Resp 18 | Ht 70.0 in | Wt 179.5 lb

## 2021-08-22 DIAGNOSIS — Z9221 Personal history of antineoplastic chemotherapy: Secondary | ICD-10-CM | POA: Insufficient documentation

## 2021-08-22 DIAGNOSIS — Z7901 Long term (current) use of anticoagulants: Secondary | ICD-10-CM | POA: Insufficient documentation

## 2021-08-22 DIAGNOSIS — K5289 Other specified noninfective gastroenteritis and colitis: Secondary | ICD-10-CM | POA: Diagnosis not present

## 2021-08-22 DIAGNOSIS — Z86718 Personal history of other venous thrombosis and embolism: Secondary | ICD-10-CM | POA: Insufficient documentation

## 2021-08-22 DIAGNOSIS — C772 Secondary and unspecified malignant neoplasm of intra-abdominal lymph nodes: Secondary | ICD-10-CM | POA: Insufficient documentation

## 2021-08-22 DIAGNOSIS — C641 Malignant neoplasm of right kidney, except renal pelvis: Secondary | ICD-10-CM | POA: Insufficient documentation

## 2021-08-22 DIAGNOSIS — Z79899 Other long term (current) drug therapy: Secondary | ICD-10-CM | POA: Diagnosis not present

## 2021-08-22 DIAGNOSIS — Z5111 Encounter for antineoplastic chemotherapy: Secondary | ICD-10-CM | POA: Diagnosis not present

## 2021-08-22 LAB — CBC WITH DIFFERENTIAL (CANCER CENTER ONLY)
Abs Immature Granulocytes: 0.03 10*3/uL (ref 0.00–0.07)
Basophils Absolute: 0.1 10*3/uL (ref 0.0–0.1)
Basophils Relative: 1 %
Eosinophils Absolute: 0 10*3/uL (ref 0.0–0.5)
Eosinophils Relative: 0 %
HCT: 42.7 % (ref 39.0–52.0)
Hemoglobin: 15.4 g/dL (ref 13.0–17.0)
Immature Granulocytes: 0 %
Lymphocytes Relative: 14 %
Lymphs Abs: 1.1 10*3/uL (ref 0.7–4.0)
MCH: 28.8 pg (ref 26.0–34.0)
MCHC: 36.1 g/dL — ABNORMAL HIGH (ref 30.0–36.0)
MCV: 80 fL (ref 80.0–100.0)
Monocytes Absolute: 0.8 10*3/uL (ref 0.1–1.0)
Monocytes Relative: 11 %
Neutro Abs: 5.7 10*3/uL (ref 1.7–7.7)
Neutrophils Relative %: 74 %
Platelet Count: 151 10*3/uL (ref 150–400)
RBC: 5.34 MIL/uL (ref 4.22–5.81)
RDW: 15.7 % — ABNORMAL HIGH (ref 11.5–15.5)
WBC Count: 7.7 10*3/uL (ref 4.0–10.5)
nRBC: 0 % (ref 0.0–0.2)

## 2021-08-22 LAB — CMP (CANCER CENTER ONLY)
ALT: 22 U/L (ref 0–44)
AST: 24 U/L (ref 15–41)
Albumin: 3.5 g/dL (ref 3.5–5.0)
Alkaline Phosphatase: 77 U/L (ref 38–126)
Anion gap: 3 — ABNORMAL LOW (ref 5–15)
BUN: 15 mg/dL (ref 8–23)
CO2: 26 mmol/L (ref 22–32)
Calcium: 8.8 mg/dL — ABNORMAL LOW (ref 8.9–10.3)
Chloride: 111 mmol/L (ref 98–111)
Creatinine: 1.15 mg/dL (ref 0.61–1.24)
GFR, Estimated: >60 mL/min (ref >60)
Glucose, Bld: 125 mg/dL — ABNORMAL HIGH (ref 70–99)
Potassium: 2.6 mmol/L — CL (ref 3.5–5.1)
Sodium: 140 mmol/L (ref 135–145)
Total Bilirubin: 0.6 mg/dL (ref 0.3–1.2)
Total Protein: 5.9 g/dL — ABNORMAL LOW (ref 6.5–8.1)

## 2021-08-22 LAB — TSH: TSH: 2.251 u[IU]/mL (ref 0.350–4.500)

## 2021-08-22 MED ORDER — POTASSIUM CHLORIDE CRYS ER 20 MEQ PO TBCR
20.0000 meq | EXTENDED_RELEASE_TABLET | Freq: Every day | ORAL | 0 refills | Status: DC
  Filled 2021-08-22: qty 14, 14d supply, fill #0

## 2021-08-22 MED ORDER — PREDNISONE 20 MG PO TABS
60.0000 mg | ORAL_TABLET | Freq: Every day | ORAL | 1 refills | Status: DC
Start: 2021-08-22 — End: 2021-10-02
  Filled 2021-08-22: qty 90, 30d supply, fill #0
  Filled 2021-09-15: qty 90, 30d supply, fill #1

## 2021-08-22 NOTE — Telephone Encounter (Signed)
Notified of message below from Dr Alen Blew. Labs 8/24 @ 1030. Message to scheduler  Please let him know he needs K replacement.  K. Dur 20 mg daily for 2 weeks.  Please schedule labs August 24 so we can repeat his potassium.

## 2021-08-22 NOTE — Progress Notes (Signed)
Hematology and Oncology Follow Up Visit  Tyler Mason [id number] 08-Aug-1950 71 y.o. 08/22/2021 11:32 AM Tyler Mason, MDHusain, [name], MD   Principle Diagnosis: 71 year old man with stage IV kidney cancer with retroperitoneal adenopathy diagnosed in January 2023.     Prior Therapy:  He is status post biopsy completed on January 22, 2021 of the retroperitoneal lymph node.  Ipilimumab 1 mg/kg with nivolumab 240 mg total dose started on February 08, 2021.  He completed 4 cycles of therapy in April 2023.  Current therapy: Nivolumab 480 mg maintenance of monthly started in April 2023.  He is here for the subsequent cycle of therapy.      Interim History: [name] returns today for follow-up.  Since last visit,He has developed diarrhea that is currently ongoing since August 14, 2021 and was seen in the emergency department on August 11.  He was prescribed Imodium and Lomotil and without any improvement at this time.  His C. difficile and stool work-up is negative.  Clinically, he reports no abdominal pain, hematochezia or melena but continues to have loose bowel habits ranging between 3-6 a day.  He denies any nausea or vomiting.  He denies any bone pain or pathological fractures.   Medications: Reviewed without changes. Current Outpatient Medications  Medication Sig Dispense Refill   acetaminophen (TYLENOL) 325 MG tablet Take 2 tablets (650 mg total) by mouth every 4 (four) hours as needed for mild pain.     ARIPiprazole (ABILIFY) 5 MG tablet TAKE 1 TABLET BY MOUTH ONCE DAILY IN THE MORNING. 90 tablet 4   ARIPiprazole (ABILIFY) 5 MG tablet Take 1 tablet by mouth every morning 90 tablet 4   ARIPiprazole (ABILIFY) 5 MG tablet Take 1 tablet by mouth every morning. 90 tablet 4   buPROPion (WELLBUTRIN XL) 150 MG 24 hr tablet Take 3 tablets (450 mg total) by mouth in the morning. 90 tablet 2   buPROPion (WELLBUTRIN XL) 300 MG 24 hr tablet Take 1 tablet (300 mg total) by mouth every morning. 90  tablet 2   buPROPion (WELLBUTRIN XL) 300 MG 24 hr tablet Take 1 tablet by mouth every morning 90 tablet 2   clindamycin (CLEOCIN) 150 MG capsule Take 1 capsule (150 mg total) by mouth 4 (four) times daily until gone, start 2 days before surgery 30 capsule 0   diphenoxylate-atropine (LOMOTIL) 2.5-0.025 MG tablet Take 1 tablet by mouth 4 (four) times daily as needed for diarrhea or loose stools. 30 tablet 0   donepezil (ARICEPT) 10 MG tablet Take 1 tablet daily 30 tablet 11   doxycycline (VIBRA-TABS) 100 MG tablet Take one tablet by mouth twice daily for 5 days 10 tablet 0   ibuprofen (ADVIL) 400 MG tablet Take 1 tablet by mouth every 4  hours as needed for moderate pain. (Patient not taking: Reported on 06/18/2021) 30 tablet 0   levothyroxine (SYNTHROID) 75 MCG tablet Take 1 tablet by mouth every morning on an empty stomach 90 tablet 3   Melatonin 5 MG CAPS Take by mouth at bedtime.     pantoprazole (PROTONIX) 40 MG tablet Take 1 tablet (40 mg total) by mouth daily. 90 tablet 3   potassium chloride SA (KLOR-CON M) 20 MEQ tablet Take 1 tablet (20 mEq total) by mouth 2 (two) times daily for 3 days. 6 tablet 0   prochlorperazine (COMPAZINE) 10 MG tablet Take 1 tablet (10 mg total) by mouth every 6 (six) hours as needed for nausea or vomiting. 30 tablet 0  simvastatin (ZOCOR) 20 MG tablet TAKE 1 TABLET BY MOUTH ONCE DAILY 90 tablet 3   simvastatin (ZOCOR) 20 MG tablet TAKE 1 TABLET BY MOUTH ONCE DAILY 90 tablet 4   simvastatin (ZOCOR) 20 MG tablet TAKE 1 TABLET BY MOUTH ONCE DAILY 90 days 90 tablet 0   simvastatin (ZOCOR) 20 MG tablet TAKE 1 TABLET BY MOUTH ONCE DAILY 90 tablet 4   vitamin B-12 (CYANOCOBALAMIN) 1000 MCG tablet Take 1,000 mcg by mouth daily.     warfarin (COUMADIN) 5 MG tablet Take 1 and 1/2 tablets by mouth on Friday and Sunday. Take 1 tablet by mouth on all other days. 96 tablet 3   No current facility-administered medications for this visit.     Allergies:  Allergies  Allergen  Reactions   Ampicillin Other (See Comments)    Made tongue sore Has patient had a PCN reaction causing immediate rash, facial/tongue/throat swelling, SOB or lightheadedness with hypotension: no Has patient had a PCN reaction causing severe rash involving mucus membranes or skin necrosis: no Has patient had a PCN reaction that required hospitalization no Has patient had a PCN reaction occurring within the last 10 years: no If all of the above answers are "NO", then may proceed with Cephalosporin use.    Macrodantin [Nitrofurantoin Macrocrystal] Itching, Swelling and Rash     Physical Exam:    Blood pressure (!) 144/74, pulse 73, temperature 98.1 F (36.7 C), temperature source Tympanic, resp. rate 18, height '5\' 10"'$  (1.778 m), weight 179 lb 8 oz (81.4 kg), SpO2 97 %.    ECOG: 1   General appearance: Comfortable appearing without any discomfort Head: Normocephalic without any trauma Oropharynx: Mucous membranes are moist and pink without any thrush or ulcers. Eyes: Pupils are equal and round reactive to light. Lymph nodes: No cervical, supraclavicular, inguinal or axillary lymphadenopathy.   Heart:regular rate and rhythm.  S1 and S2 without leg edema. Lung: Clear without any rhonchi or wheezes.  No dullness to percussion. Abdomin: Soft, nontender, nondistended with good bowel sounds.  No hepatosplenomegaly. Musculoskeletal: No joint deformity or effusion.  Full range of motion noted. Neurological: No deficits noted on motor, sensory and deep tendon reflex exam. Skin: No petechial rash or dryness.  Appeared moist.         Lab Results: Lab Results  Component Value Date   WBC 8.1 08/16/2021   HGB 15.5 08/16/2021   HCT 45.2 08/16/2021   MCV 82.6 08/16/2021   PLT 155 08/16/2021     Chemistry      Component Value Date/Time   NA 136 08/16/2021 1647   K 3.1 (L) 08/16/2021 1647   CL 107 08/16/2021 1647   CO2 22 08/16/2021 1647   BUN 11 08/16/2021 1647   CREATININE 1.17  08/16/2021 1647   CREATININE 1.19 07/25/2021 0931      Component Value Date/Time   CALCIUM 9.0 08/16/2021 1647   ALKPHOS 69 08/16/2021 1647   AST 27 08/16/2021 1647   AST 23 07/25/2021 0931   ALT 23 08/16/2021 1647   ALT 21 07/25/2021 0931   BILITOT 0.7 08/16/2021 1647   BILITOT 0.6 07/25/2021 0931      IMPRESSION: Interval resolution of small right renal mass. Stable mild abdominal retroperitoneal lymphadenopathy.   No acute findings.  No new or progressive metastatic disease.   Bilateral nephrolithiasis. No evidence of ureteral calculi or hydronephrosis.   Hepatic cirrhosis. No evidence of hepatic neoplasm.   Colonic diverticulosis, without radiographic evidence of diverticulitis.  Impression and Plan:  71 year old with:  1.  Stage IV kidney cancer diagnosed with retroperitoneal adenopathy in January 2023.    The natural course of his disease was reviewed at this time and treatment choices were discussed.  CT scan obtained on August 16, 2021 showed resolution of his primary renal neoplasm and very small retroperitoneal adenopathy remaining.  Risks and benefits of continuing maintenance nivolumab were discussed at this time.  After discussion today, I opted to withhold immunotherapy at this time given his possible autoimmune colitis and excellent response to therapy.   2.  Diarrhea: Differential diagnosis include autoimmune colitis versus other causes.  His C. difficile is negative work-up overall remarkable.  We will start prednisone 60 mg total dose daily and monitor closely.  Colonoscopy and biopsy may be required and additional therapy could be needed if he has less than adequate response.  If he has an excellent response to prednisone will be tapered by 20 mg a week after he has this.   3.  Antiemetics: Compazine is available to him without any nausea or vomiting.   4.  Immune mediated complications: He has developed a diarrhea which could be a sign of colitis.   No other complications noted including pneumonitis, hepatitis thyroid disease.   5.  Recurrent venous thromboembolism: He is currently on warfarin.   6.  CNS metastasis surveillance: No evidence of metastatic disease noted.   7.  Follow-up: Will be in 4 weeks for repeat evaluation.  30  minutes were spent on this visit.  The time was dedicated to reviewing laboratory data, disease status update and addressing complication related to cancer and cancer therapy.     Zola Button, MD 8/17/202311:32 AM

## 2021-08-23 ENCOUNTER — Other Ambulatory Visit (HOSPITAL_COMMUNITY): Payer: Self-pay

## 2021-08-24 LAB — T4: T4, Total: 6.5 ug/dL (ref 4.5–12.0)

## 2021-08-29 ENCOUNTER — Other Ambulatory Visit: Payer: Self-pay

## 2021-08-29 ENCOUNTER — Telehealth: Payer: Self-pay

## 2021-08-29 ENCOUNTER — Telehealth: Payer: Self-pay | Admitting: Oncology

## 2021-08-29 ENCOUNTER — Other Ambulatory Visit (HOSPITAL_COMMUNITY): Payer: Self-pay

## 2021-08-29 ENCOUNTER — Other Ambulatory Visit: Payer: Self-pay | Admitting: Oncology

## 2021-08-29 ENCOUNTER — Inpatient Hospital Stay: Payer: PPO

## 2021-08-29 DIAGNOSIS — C649 Malignant neoplasm of unspecified kidney, except renal pelvis: Secondary | ICD-10-CM

## 2021-08-29 DIAGNOSIS — K5289 Other specified noninfective gastroenteritis and colitis: Secondary | ICD-10-CM | POA: Diagnosis not present

## 2021-08-29 DIAGNOSIS — K529 Noninfective gastroenteritis and colitis, unspecified: Secondary | ICD-10-CM | POA: Insufficient documentation

## 2021-08-29 DIAGNOSIS — C641 Malignant neoplasm of right kidney, except renal pelvis: Secondary | ICD-10-CM

## 2021-08-29 LAB — CBC WITH DIFFERENTIAL (CANCER CENTER ONLY)
Abs Immature Granulocytes: 0.09 10*3/uL — ABNORMAL HIGH (ref 0.00–0.07)
Basophils Absolute: 0.1 10*3/uL (ref 0.0–0.1)
Basophils Relative: 0 %
Eosinophils Absolute: 0 10*3/uL (ref 0.0–0.5)
Eosinophils Relative: 0 %
HCT: 44.4 % (ref 39.0–52.0)
Hemoglobin: 16 g/dL (ref 13.0–17.0)
Immature Granulocytes: 1 %
Lymphocytes Relative: 15 %
Lymphs Abs: 2.5 10*3/uL (ref 0.7–4.0)
MCH: 28.8 pg (ref 26.0–34.0)
MCHC: 36 g/dL (ref 30.0–36.0)
MCV: 80 fL (ref 80.0–100.0)
Monocytes Absolute: 2.1 10*3/uL — ABNORMAL HIGH (ref 0.1–1.0)
Monocytes Relative: 13 %
Neutro Abs: 11.8 10*3/uL — ABNORMAL HIGH (ref 1.7–7.7)
Neutrophils Relative %: 71 %
Platelet Count: 182 10*3/uL (ref 150–400)
RBC: 5.55 MIL/uL (ref 4.22–5.81)
RDW: 16 % — ABNORMAL HIGH (ref 11.5–15.5)
WBC Count: 16.5 10*3/uL — ABNORMAL HIGH (ref 4.0–10.5)
nRBC: 0 % (ref 0.0–0.2)

## 2021-08-29 LAB — CMP (CANCER CENTER ONLY)
ALT: 43 U/L (ref 0–44)
AST: 34 U/L (ref 15–41)
Albumin: 3.5 g/dL (ref 3.5–5.0)
Alkaline Phosphatase: 89 U/L (ref 38–126)
Anion gap: 7 (ref 5–15)
BUN: 19 mg/dL (ref 8–23)
CO2: 28 mmol/L (ref 22–32)
Calcium: 8.9 mg/dL (ref 8.9–10.3)
Chloride: 108 mmol/L (ref 98–111)
Creatinine: 1.05 mg/dL (ref 0.61–1.24)
GFR, Estimated: >60 mL/min (ref >60)
Glucose, Bld: 95 mg/dL (ref 70–99)
Potassium: 2.4 mmol/L — CL (ref 3.5–5.1)
Sodium: 143 mmol/L (ref 135–145)
Total Bilirubin: 0.7 mg/dL (ref 0.3–1.2)
Total Protein: 5.8 g/dL — ABNORMAL LOW (ref 6.5–8.1)

## 2021-08-29 LAB — TSH: TSH: 5.198 u[IU]/mL — ABNORMAL HIGH (ref 0.350–4.500)

## 2021-08-29 MED ORDER — DIPHENOXYLATE-ATROPINE 2.5-0.025 MG PO TABS: 1.0000 | ORAL_TABLET | Freq: Four times a day (QID) | ORAL | 0 refills | Status: DC | PRN

## 2021-08-29 MED ORDER — DIPHENOXYLATE-ATROPINE 2.5-0.025 MG PO TABS
1.0000 | ORAL_TABLET | Freq: Four times a day (QID) | ORAL | 0 refills | Status: DC
  Filled 2021-08-29: qty 30, 8d supply, fill #0

## 2021-08-29 NOTE — Telephone Encounter (Signed)
Spoke with pt's wife to advise that Dr. Alen Blew would refer pt to GI for evaluation of diarrhea, he will schedule pt for infusion of Remicade and refill prescription for Lomotil has been called in to [facility].

## 2021-08-29 NOTE — Telephone Encounter (Signed)
TCT patient's wife. Dr. Alen Blew recommends continuing with Lomotil for loose stools. Refill sent to pharmacy. Patient will be set up for Remicade infusion and Dr. Alen Blew will refer to GI. Wife voiced understanding.  Reviewed need to maintain hydration.

## 2021-08-29 NOTE — Telephone Encounter (Signed)
Wife called to say that patient continues to have diarrhea. Pt presented to ED on 08/16/21 with negative C Diff. Since that time patient has had multiple liquid stools daily. Prescription for Lomotil has been been used with no positive results. Wife is concerned that patient has been taking Imodium for 2.5 weeks and nothing seems better. Wife is asking if anything else can be done.  Routed to provider.

## 2021-08-29 NOTE — Telephone Encounter (Signed)
Contacted patient to scheduled appointments. Patient is aware of appointments that are scheduled.   

## 2021-08-29 NOTE — Telephone Encounter (Signed)
Spoke with patient's wife to advise of patient's low potassium level. Patient scheduled for potassium infusion on 08/30/21 @ 11:30.

## 2021-08-30 ENCOUNTER — Other Ambulatory Visit: Payer: Self-pay | Admitting: *Deleted

## 2021-08-30 ENCOUNTER — Inpatient Hospital Stay: Payer: PPO

## 2021-08-30 ENCOUNTER — Other Ambulatory Visit: Payer: Self-pay | Admitting: Oncology

## 2021-08-30 VITALS — BP 144/78 | HR 62 | Temp 98.0°F | Resp 18

## 2021-08-30 DIAGNOSIS — Z5112 Encounter for antineoplastic immunotherapy: Secondary | ICD-10-CM

## 2021-08-30 DIAGNOSIS — K5289 Other specified noninfective gastroenteritis and colitis: Secondary | ICD-10-CM | POA: Diagnosis not present

## 2021-08-30 DIAGNOSIS — C649 Malignant neoplasm of unspecified kidney, except renal pelvis: Secondary | ICD-10-CM

## 2021-08-30 DIAGNOSIS — C641 Malignant neoplasm of right kidney, except renal pelvis: Secondary | ICD-10-CM

## 2021-08-30 DIAGNOSIS — K529 Noninfective gastroenteritis and colitis, unspecified: Secondary | ICD-10-CM

## 2021-08-30 MED ORDER — POTASSIUM CHLORIDE 10 MEQ/100ML IV SOLN
10.0000 meq | INTRAVENOUS | Status: AC
  Administered 2021-08-30 (×4): 10 meq via INTRAVENOUS
  Filled 2021-08-30: qty 100

## 2021-08-30 MED ORDER — SODIUM CHLORIDE 0.9 % IV SOLN: INTRAVENOUS | Status: DC

## 2021-08-30 NOTE — Patient Instructions (Signed)
Colitis  Colitis is a condition in which the colon is inflamed. It can cause diarrhea, blood in the stool, and abdominal pain. Colitis can last a short time (be acute), or it may last a long time (become chronic). What are the causes? This condition may be caused by: Infections from viruses or bacteria. A reaction to medicine. Certain autoimmune diseases, such as Crohn's disease or ulcerative colitis. Radiation treatment. Decreased blood flow to the bowel (ischemia). What are the signs or symptoms? Symptoms of this condition include: Diarrhea, blood in the stool, or black, tarry stool. Pain in the joints or abdominal pain. Fever or fatigue. Vomiting. Weight loss. Bloating. Having fewer bowel movements than usual. A strong and sudden urge to have a bowel movement. Feeling like the bowel is not empty after a bowel movement. How is this diagnosed? This condition may be diagnosed based on a stool test and a blood test. You may also have other tests, such as: X-rays. CT scan. Colonoscopy. Endoscopy. Biopsy. How is this treated? Treatment for this condition depends on the cause. This condition may be treated with: Steps to rest the bowel, such as not eating or drinking for a period of time. Fluids that are given through an IV. Medicine for pain and diarrhea. Antibiotic medicines. Cortisone medicines. Surgery. Follow these instructions at home: Eating and drinking  Follow instructions from your health care provider about eating or drinking restrictions. Drink enough fluid to keep your urine pale yellow. Work with a dietitian to determine whether certain foods cause your condition to flare up. Avoid foods or drinks that cause flare-ups. Eat a well-balanced diet. General instructions If you were prescribed an antibiotic medicine, take it as told by your health care provider. Do not stop taking the antibiotic even if you start to feel better. Take over-the-counter and prescription  medicines only as told by your health care provider. Keep all follow-up visits. This is important. Contact a health care provider if: Your symptoms do not go away. You develop new symptoms. Get help right away if: You have a fever that does not go away with treatment. You develop chills. You have extreme weakness, fainting, or dehydration. You vomit repeatedly. You develop severe pain in your abdomen. You pass bloody or tarry stool. Summary Colitis is a condition in which the colon is inflamed. Colitis can last a short time (be acute), or it may last a long time (become chronic). Treatment for this condition depends on the cause and may include resting the bowel, taking medicines, or having surgery. If you were prescribed an antibiotic medicine, take it as told by your health care provider. Do not stop taking the antibiotic even if you start to feel better. Get help right away if you develop severe pain in your abdomen. Keep all follow-up visits. This is important. This information is not intended to replace advice given to you by your health care provider. Make sure you discuss any questions you have with your health care provider. Document Revised: 08/30/2019 Document Reviewed: 08/30/2019 Elsevier Patient Education  2023 Elsevier Inc.  

## 2021-09-02 DIAGNOSIS — Z7901 Long term (current) use of anticoagulants: Secondary | ICD-10-CM | POA: Diagnosis not present

## 2021-09-03 ENCOUNTER — Other Ambulatory Visit: Payer: Self-pay

## 2021-09-03 ENCOUNTER — Other Ambulatory Visit: Payer: Self-pay | Admitting: *Deleted

## 2021-09-03 ENCOUNTER — Other Ambulatory Visit (HOSPITAL_COMMUNITY): Payer: Self-pay

## 2021-09-03 ENCOUNTER — Inpatient Hospital Stay: Payer: PPO

## 2021-09-03 VITALS — BP 145/69 | HR 61 | Temp 97.8°F | Resp 18

## 2021-09-03 DIAGNOSIS — K529 Noninfective gastroenteritis and colitis, unspecified: Secondary | ICD-10-CM

## 2021-09-03 DIAGNOSIS — K5289 Other specified noninfective gastroenteritis and colitis: Secondary | ICD-10-CM | POA: Diagnosis not present

## 2021-09-03 DIAGNOSIS — C641 Malignant neoplasm of right kidney, except renal pelvis: Secondary | ICD-10-CM

## 2021-09-03 LAB — CMP (CANCER CENTER ONLY)
ALT: 51 U/L — ABNORMAL HIGH (ref 0–44)
AST: 35 U/L (ref 15–41)
Albumin: 3.3 g/dL — ABNORMAL LOW (ref 3.5–5.0)
Alkaline Phosphatase: 80 U/L (ref 38–126)
Anion gap: 5 (ref 5–15)
BUN: 22 mg/dL (ref 8–23)
CO2: 31 mmol/L (ref 22–32)
Calcium: 8.8 mg/dL — ABNORMAL LOW (ref 8.9–10.3)
Chloride: 108 mmol/L (ref 98–111)
Creatinine: 1.25 mg/dL — ABNORMAL HIGH (ref 0.61–1.24)
GFR, Estimated: >60 mL/min (ref >60)
Glucose, Bld: 107 mg/dL — ABNORMAL HIGH (ref 70–99)
Potassium: 3 mmol/L — ABNORMAL LOW (ref 3.5–5.1)
Sodium: 144 mmol/L (ref 135–145)
Total Bilirubin: 0.6 mg/dL (ref 0.3–1.2)
Total Protein: 5.3 g/dL — ABNORMAL LOW (ref 6.5–8.1)

## 2021-09-03 MED ORDER — SODIUM CHLORIDE 0.9 % IV SOLN: Freq: Once | INTRAVENOUS | Status: AC

## 2021-09-03 MED ORDER — FAMOTIDINE IN NACL 20-0.9 MG/50ML-% IV SOLN: 20.0000 mg | Freq: Once | INTRAVENOUS | Status: DC | PRN

## 2021-09-03 MED ORDER — ALBUTEROL SULFATE HFA 108 (90 BASE) MCG/ACT IN AERS: 2.0000 | INHALATION_SPRAY | Freq: Once | RESPIRATORY_TRACT | Status: DC | PRN

## 2021-09-03 MED ORDER — SODIUM CHLORIDE 0.9 % IV SOLN
5.0000 mg/kg | Freq: Once | INTRAVENOUS | Status: AC
  Administered 2021-09-03: 400 mg via INTRAVENOUS
  Filled 2021-09-03: qty 40

## 2021-09-03 MED ORDER — DIPHENHYDRAMINE HCL 25 MG PO CAPS
25.0000 mg | ORAL_CAPSULE | Freq: Once | ORAL | Status: AC
  Administered 2021-09-03: 25 mg via ORAL
  Filled 2021-09-03: qty 1

## 2021-09-03 MED ORDER — EPINEPHRINE 0.3 MG/0.3ML IJ SOAJ: 0.3000 mg | Freq: Once | INTRAMUSCULAR | Status: DC | PRN

## 2021-09-03 MED ORDER — ACETAMINOPHEN 325 MG PO TABS
650.0000 mg | ORAL_TABLET | Freq: Once | ORAL | Status: AC
  Administered 2021-09-03: 650 mg via ORAL
  Filled 2021-09-03: qty 2

## 2021-09-03 MED ORDER — SODIUM CHLORIDE 0.9 % IV SOLN: Freq: Once | INTRAVENOUS | Status: DC | PRN

## 2021-09-03 MED ORDER — DIPHENHYDRAMINE HCL 50 MG/ML IJ SOLN: 50.0000 mg | Freq: Once | INTRAMUSCULAR | Status: DC | PRN

## 2021-09-03 MED ORDER — METHYLPREDNISOLONE SODIUM SUCC 125 MG IJ SOLR: 125.0000 mg | Freq: Once | INTRAMUSCULAR | Status: DC | PRN

## 2021-09-03 MED ORDER — POTASSIUM CHLORIDE CRYS ER 20 MEQ PO TBCR
20.0000 meq | EXTENDED_RELEASE_TABLET | Freq: Every day | ORAL | 2 refills | Status: DC
  Filled 2021-09-03: qty 30, 30d supply, fill #0

## 2021-09-03 NOTE — Patient Instructions (Signed)
Infliximab Injection What is this medication? INFLIXIMAB (in [place] i mab) treats autoimmune conditions, such as psoriasis, arthritis, Crohn's disease, and ulcerative colitis. It works by slowing down an overactive immune system. It belongs to a group of medications called TNF inhibitors. It is a monoclonal antibody. This medicine may be used for other purposes; ask your health care provider or pharmacist if you have questions. COMMON BRAND NAME(S): AVSOLA, INFLECTRA, IXIFI, Remicade, RENFLEXIS What should I tell my care team before I take this medication? They need to know if you have any of these conditions: Cancer Current or past resident of [place] or [place] Diabetes Exposure to tuberculosis Guillain-Barre syndrome Heart failure Liver disease Immune system problems Infection Lung or breathing disease, such as COPD Multiple sclerosis Receiving phototherapy for the skin Seizure disorder An unusual or allergic reaction to infliximab, mouse proteins, other medications, foods, dyes, or preservatives Pregnant or trying to get pregnant Breast-feeding How should I use this medication? This medication is injected into a vein. It is usually given by a care team in a hospital or clinic setting. A special MedGuide will be given to you by the pharmacist with each prescription and refill. Be sure to read this information carefully each time. Talk to your care team about the use of this medication in children. While it may be prescribed for children as young as 77 years of age for selected conditions, precautions do apply. Overdosage: If you think you have taken too much of this medicine contact a poison control center or emergency room at once. NOTE: This medicine is only for you. Do not share this medicine with others. What if I miss a dose? Keep appointments for follow-up doses. It is important not to miss your dose. Call your care team if you are unable to keep an  appointment. What may interact with this medication? Do not take this medication with any of the following: Biologic medications, such as abatacept, adalimumab, anakinra, certolizumab, etanercept, golimumab, rituximab, secukinumab, tocilizumab, tofactinib, ustekinumab Live vaccines This list may not describe all possible interactions. Give your health care provider a list of all the medicines, herbs, non-prescription drugs, or dietary supplements you use. Also tell them if you smoke, drink alcohol, or use illegal drugs. Some items may interact with your medicine. What should I watch for while using this medication? Visit your care team for regular checks on your progress. Your condition will be monitored carefully while you are receiving this medication. You may need blood work done while you are taking this medication. You will be tested for tuberculosis (TB) before you start this medication. If your care team prescribes any medication for TB, you should start taking the TB medication before starting this medication. Make sure to finish the full course of TB medication. This medication may increase your risk of getting an infection. Call your care team for advice if you get a fever, chills, sore throat, or other symptoms of a cold or flu. Do not treat yourself. Try to avoid being around people who are sick. This medication may make the symptoms of heart failure worse in some patients. Contact your care team right away if you develop signs or symptoms of heart failure. Before having surgery or dental work, talk to your care team to make sure it is ok. This medication can increase the risk of poor healing of your surgical site or wound. If you take this medication for plaque psoriasis, stay out of the sun. If you cannot avoid being  in the sun, wear protective clothing and sunscreen. Do not use sun lamps or tanning beds/booths. Talk to your care team about your risk of cancer. You may be more at risk for  certain types of cancers if you take this medication. What side effects may I notice from receiving this medication? Side effects that you should report to your care team as soon as possible: Allergic reactions--skin rash, itching, hives, swelling of the face, lips, tongue, or throat Body pain, tingling, or numbness Heart attack--pain or tightness in the chest, shoulders, arms, or jaw, nausea, shortness of breath, cold or clammy skin, feeling faint or lightheaded Heart failure--shortness of breath, swelling of the ankles, feet, or hands, sudden weight gain, unusual weakness or fatigue Heart rhythm changes--fast or irregular heartbeat, dizziness, feeling faint or lightheaded, chest pain, trouble breathing Increase in blood pressure Infection--fever, chills, cough, sore throat, wounds that don't heal, pain or trouble when passing urine, general feeling of discomfort or being unwell Liver injury--right upper belly pain, loss of appetite, nausea, light-colored stool, dark yellow or brown urine, yellowing skin or eyes, unusual weakness or fatigue Low blood pressure--dizziness, feeling faint or lightheaded, blurry vision Lupus-like syndrome--joint pain, swelling, or stiffness, butterfly-shaped rash on the face, rashes that get worse in the sun, fever, unusual weakness or fatigue Seizures Stroke--sudden numbness or weakness of the face, arm, or leg, trouble speaking, confusion, trouble walking, loss of balance or coordination, dizziness, severe headache, change in vision Sudden vision loss in one or both eyes Unusual bruising or bleeding Side effects that usually do not require medical attention (report to your care team if they continue or are bothersome): Cough Diarrhea Fatigue Headache Nausea Runny or stuffy nose Sore throat Stomach pain This list may not describe all possible side effects. Call your doctor for medical advice about side effects. You may report side effects to FDA at  1-800-FDA-1088. Where should I keep my medication? This medication is given in a hospital or clinic. It will not be stored at home. NOTE: This sheet is a summary. It may not cover all possible information. If you have questions about this medicine, talk to your doctor, pharmacist, or health care provider.  2023 Elsevier/Gold Standard (2021-03-07 00:00:00)

## 2021-09-04 ENCOUNTER — Telehealth: Payer: Self-pay | Admitting: Gastroenterology

## 2021-09-04 ENCOUNTER — Other Ambulatory Visit (HOSPITAL_COMMUNITY): Payer: Self-pay

## 2021-09-04 NOTE — Telephone Encounter (Signed)
Hi Dr. Silverio Decamp,  [place] 8/28, p.m.  We received records from this patient who is requesting a transfer of care to [place].  Dr. Alen Blew, patient's PCP, is the one referring patient to [place] as he would like to keep all his care under the [facility] umbrella.  Please let me know how you would like to proceed.  Thank you.

## 2021-09-05 ENCOUNTER — Other Ambulatory Visit (HOSPITAL_COMMUNITY): Payer: Self-pay

## 2021-09-05 ENCOUNTER — Other Ambulatory Visit: Payer: Self-pay | Admitting: Oncology

## 2021-09-05 MED ORDER — DIPHENOXYLATE-ATROPINE 2.5-0.025 MG PO TABS
1.0000 | ORAL_TABLET | Freq: Four times a day (QID) | ORAL | 0 refills | Status: DC
  Filled 2021-09-05: qty 30, 8d supply, fill #0

## 2021-09-08 DIAGNOSIS — R0683 Snoring: Secondary | ICD-10-CM | POA: Diagnosis not present

## 2021-09-08 DIAGNOSIS — G4733 Obstructive sleep apnea (adult) (pediatric): Secondary | ICD-10-CM | POA: Diagnosis not present

## 2021-09-12 DIAGNOSIS — S51011A Laceration without foreign body of right elbow, initial encounter: Secondary | ICD-10-CM | POA: Diagnosis not present

## 2021-09-12 DIAGNOSIS — C649 Malignant neoplasm of unspecified kidney, except renal pelvis: Secondary | ICD-10-CM | POA: Diagnosis not present

## 2021-09-12 DIAGNOSIS — R5381 Other malaise: Secondary | ICD-10-CM | POA: Diagnosis not present

## 2021-09-12 DIAGNOSIS — R269 Unspecified abnormalities of gait and mobility: Secondary | ICD-10-CM | POA: Diagnosis not present

## 2021-09-12 DIAGNOSIS — R627 Adult failure to thrive: Secondary | ICD-10-CM | POA: Diagnosis not present

## 2021-09-12 DIAGNOSIS — E876 Hypokalemia: Secondary | ICD-10-CM | POA: Diagnosis not present

## 2021-09-15 ENCOUNTER — Other Ambulatory Visit: Payer: Self-pay | Admitting: Oncology

## 2021-09-16 ENCOUNTER — Other Ambulatory Visit (HOSPITAL_COMMUNITY): Payer: Self-pay

## 2021-09-16 ENCOUNTER — Encounter: Payer: Self-pay | Admitting: Oncology

## 2021-09-16 MED ORDER — DIPHENOXYLATE-ATROPINE 2.5-0.025 MG PO TABS
1.0000 | ORAL_TABLET | Freq: Four times a day (QID) | ORAL | 0 refills | Status: AC
  Filled 2021-09-16: qty 30, 8d supply, fill #0

## 2021-09-18 NOTE — Telephone Encounter (Signed)
Request received to transfer GI care from outside practice to [facility].  We appreciate the interest in our practice, however at this time due to high demand from patients without established GI providers we cannot accommodate this transfer.  Ability to accommodate future transfer requests may change over time and the patient can contact us again in 6-12 months if still interested in being seen at [facility].      °

## 2021-09-19 ENCOUNTER — Inpatient Hospital Stay: Payer: PPO | Attending: Oncology | Admitting: Oncology

## 2021-09-19 ENCOUNTER — Telehealth: Payer: Self-pay | Admitting: *Deleted

## 2021-09-19 ENCOUNTER — Inpatient Hospital Stay: Payer: PPO

## 2021-09-19 ENCOUNTER — Other Ambulatory Visit: Payer: Self-pay | Admitting: *Deleted

## 2021-09-19 VITALS — BP 130/91 | HR 102 | Temp 98.2°F | Resp 14 | Ht 70.0 in | Wt 175.1 lb

## 2021-09-19 VITALS — BP 132/80 | HR 92 | Temp 98.3°F | Resp 20

## 2021-09-19 DIAGNOSIS — C7989 Secondary malignant neoplasm of other specified sites: Secondary | ICD-10-CM | POA: Diagnosis present

## 2021-09-19 DIAGNOSIS — R4182 Altered mental status, unspecified: Secondary | ICD-10-CM | POA: Diagnosis not present

## 2021-09-19 DIAGNOSIS — G0490 Encephalitis and encephalomyelitis, unspecified: Secondary | ICD-10-CM | POA: Diagnosis present

## 2021-09-19 DIAGNOSIS — Z7189 Other specified counseling: Secondary | ICD-10-CM | POA: Diagnosis not present

## 2021-09-19 DIAGNOSIS — R109 Unspecified abdominal pain: Secondary | ICD-10-CM | POA: Diagnosis not present

## 2021-09-19 DIAGNOSIS — R000 Tachycardia, unspecified: Secondary | ICD-10-CM | POA: Diagnosis not present

## 2021-09-19 DIAGNOSIS — E785 Hyperlipidemia, unspecified: Secondary | ICD-10-CM | POA: Diagnosis present

## 2021-09-19 DIAGNOSIS — C641 Malignant neoplasm of right kidney, except renal pelvis: Secondary | ICD-10-CM

## 2021-09-19 DIAGNOSIS — Z7901 Long term (current) use of anticoagulants: Secondary | ICD-10-CM | POA: Insufficient documentation

## 2021-09-19 DIAGNOSIS — J189 Pneumonia, unspecified organism: Secondary | ICD-10-CM | POA: Diagnosis not present

## 2021-09-19 DIAGNOSIS — Z79899 Other long term (current) drug therapy: Secondary | ICD-10-CM | POA: Insufficient documentation

## 2021-09-19 DIAGNOSIS — Z7401 Bed confinement status: Secondary | ICD-10-CM | POA: Diagnosis not present

## 2021-09-19 DIAGNOSIS — E210 Primary hyperparathyroidism: Secondary | ICD-10-CM | POA: Diagnosis present

## 2021-09-19 DIAGNOSIS — E039 Hypothyroidism, unspecified: Secondary | ICD-10-CM | POA: Diagnosis present

## 2021-09-19 DIAGNOSIS — D6862 Lupus anticoagulant syndrome: Secondary | ICD-10-CM | POA: Diagnosis present

## 2021-09-19 DIAGNOSIS — J181 Lobar pneumonia, unspecified organism: Secondary | ICD-10-CM | POA: Diagnosis not present

## 2021-09-19 DIAGNOSIS — R5383 Other fatigue: Secondary | ICD-10-CM | POA: Insufficient documentation

## 2021-09-19 DIAGNOSIS — N179 Acute kidney failure, unspecified: Secondary | ICD-10-CM | POA: Diagnosis present

## 2021-09-19 DIAGNOSIS — J9601 Acute respiratory failure with hypoxia: Secondary | ICD-10-CM | POA: Diagnosis present

## 2021-09-19 DIAGNOSIS — R918 Other nonspecific abnormal finding of lung field: Secondary | ICD-10-CM | POA: Diagnosis not present

## 2021-09-19 DIAGNOSIS — F03A3 Unspecified dementia, mild, with mood disturbance: Secondary | ICD-10-CM | POA: Diagnosis present

## 2021-09-19 DIAGNOSIS — R760 Raised antibody titer: Secondary | ICD-10-CM | POA: Diagnosis not present

## 2021-09-19 DIAGNOSIS — Z66 Do not resuscitate: Secondary | ICD-10-CM | POA: Diagnosis present

## 2021-09-19 DIAGNOSIS — E876 Hypokalemia: Secondary | ICD-10-CM | POA: Insufficient documentation

## 2021-09-19 DIAGNOSIS — F411 Generalized anxiety disorder: Secondary | ICD-10-CM | POA: Diagnosis not present

## 2021-09-19 DIAGNOSIS — Z87820 Personal history of traumatic brain injury: Secondary | ICD-10-CM | POA: Diagnosis not present

## 2021-09-19 DIAGNOSIS — Z5112 Encounter for antineoplastic immunotherapy: Secondary | ICD-10-CM | POA: Insufficient documentation

## 2021-09-19 DIAGNOSIS — Z86718 Personal history of other venous thrombosis and embolism: Secondary | ICD-10-CM | POA: Insufficient documentation

## 2021-09-19 DIAGNOSIS — F03A4 Unspecified dementia, mild, with anxiety: Secondary | ICD-10-CM | POA: Diagnosis present

## 2021-09-19 DIAGNOSIS — C772 Secondary and unspecified malignant neoplasm of intra-abdominal lymph nodes: Secondary | ICD-10-CM | POA: Insufficient documentation

## 2021-09-19 DIAGNOSIS — K529 Noninfective gastroenteritis and colitis, unspecified: Secondary | ICD-10-CM

## 2021-09-19 DIAGNOSIS — R6510 Systemic inflammatory response syndrome (SIRS) of non-infectious origin without acute organ dysfunction: Secondary | ICD-10-CM | POA: Diagnosis not present

## 2021-09-19 DIAGNOSIS — R0602 Shortness of breath: Secondary | ICD-10-CM | POA: Diagnosis not present

## 2021-09-19 DIAGNOSIS — G9340 Encephalopathy, unspecified: Secondary | ICD-10-CM | POA: Diagnosis not present

## 2021-09-19 DIAGNOSIS — A419 Sepsis, unspecified organism: Secondary | ICD-10-CM | POA: Diagnosis present

## 2021-09-19 DIAGNOSIS — Z20822 Contact with and (suspected) exposure to covid-19: Secondary | ICD-10-CM | POA: Diagnosis present

## 2021-09-19 DIAGNOSIS — W19XXXA Unspecified fall, initial encounter: Secondary | ICD-10-CM | POA: Diagnosis not present

## 2021-09-19 DIAGNOSIS — Z23 Encounter for immunization: Secondary | ICD-10-CM | POA: Diagnosis not present

## 2021-09-19 DIAGNOSIS — N200 Calculus of kidney: Secondary | ICD-10-CM | POA: Diagnosis not present

## 2021-09-19 DIAGNOSIS — K521 Toxic gastroenteritis and colitis: Secondary | ICD-10-CM | POA: Diagnosis present

## 2021-09-19 DIAGNOSIS — Z7952 Long term (current) use of systemic steroids: Secondary | ICD-10-CM | POA: Insufficient documentation

## 2021-09-19 DIAGNOSIS — J690 Pneumonitis due to inhalation of food and vomit: Secondary | ICD-10-CM | POA: Diagnosis present

## 2021-09-19 DIAGNOSIS — G4733 Obstructive sleep apnea (adult) (pediatric): Secondary | ICD-10-CM | POA: Diagnosis not present

## 2021-09-19 DIAGNOSIS — D849 Immunodeficiency, unspecified: Secondary | ICD-10-CM | POA: Diagnosis not present

## 2021-09-19 DIAGNOSIS — G9341 Metabolic encephalopathy: Secondary | ICD-10-CM | POA: Diagnosis present

## 2021-09-19 DIAGNOSIS — R5381 Other malaise: Secondary | ICD-10-CM | POA: Insufficient documentation

## 2021-09-19 DIAGNOSIS — I700 Atherosclerosis of aorta: Secondary | ICD-10-CM | POA: Diagnosis not present

## 2021-09-19 DIAGNOSIS — D696 Thrombocytopenia, unspecified: Secondary | ICD-10-CM | POA: Diagnosis present

## 2021-09-19 DIAGNOSIS — R6520 Severe sepsis without septic shock: Secondary | ICD-10-CM | POA: Diagnosis present

## 2021-09-19 DIAGNOSIS — R531 Weakness: Secondary | ICD-10-CM | POA: Diagnosis not present

## 2021-09-19 DIAGNOSIS — C649 Malignant neoplasm of unspecified kidney, except renal pelvis: Secondary | ICD-10-CM | POA: Diagnosis not present

## 2021-09-19 DIAGNOSIS — Z452 Encounter for adjustment and management of vascular access device: Secondary | ICD-10-CM | POA: Diagnosis not present

## 2021-09-19 DIAGNOSIS — Z79890 Hormone replacement therapy (postmenopausal): Secondary | ICD-10-CM | POA: Diagnosis not present

## 2021-09-19 DIAGNOSIS — F329 Major depressive disorder, single episode, unspecified: Secondary | ICD-10-CM | POA: Diagnosis present

## 2021-09-19 DIAGNOSIS — Z515 Encounter for palliative care: Secondary | ICD-10-CM | POA: Diagnosis not present

## 2021-09-19 LAB — TSH: TSH: 3.186 u[IU]/mL (ref 0.350–4.500)

## 2021-09-19 LAB — CMP (CANCER CENTER ONLY)
ALT: 84 U/L — ABNORMAL HIGH (ref 0–44)
AST: 40 U/L (ref 15–41)
Albumin: 3.4 g/dL — ABNORMAL LOW (ref 3.5–5.0)
Alkaline Phosphatase: 90 U/L (ref 38–126)
Anion gap: 6 (ref 5–15)
BUN: 23 mg/dL (ref 8–23)
CO2: 25 mmol/L (ref 22–32)
Calcium: 8.7 mg/dL — ABNORMAL LOW (ref 8.9–10.3)
Chloride: 107 mmol/L (ref 98–111)
Creatinine: 1.13 mg/dL (ref 0.61–1.24)
GFR, Estimated: >60 mL/min (ref >60)
Glucose, Bld: 102 mg/dL — ABNORMAL HIGH (ref 70–99)
Potassium: 3.9 mmol/L (ref 3.5–5.1)
Sodium: 138 mmol/L (ref 135–145)
Total Bilirubin: 0.8 mg/dL (ref 0.3–1.2)
Total Protein: 5.5 g/dL — ABNORMAL LOW (ref 6.5–8.1)

## 2021-09-19 LAB — CBC WITH DIFFERENTIAL (CANCER CENTER ONLY)
Abs Immature Granulocytes: 0.1 10*3/uL — ABNORMAL HIGH (ref 0.00–0.07)
Basophils Absolute: 0.1 10*3/uL (ref 0.0–0.1)
Basophils Relative: 1 %
Eosinophils Absolute: 0 10*3/uL (ref 0.0–0.5)
Eosinophils Relative: 0 %
HCT: 50.8 % (ref 39.0–52.0)
Hemoglobin: 17.4 g/dL — ABNORMAL HIGH (ref 13.0–17.0)
Immature Granulocytes: 1 %
Lymphocytes Relative: 10 %
Lymphs Abs: 1.3 10*3/uL (ref 0.7–4.0)
MCH: 29.2 pg (ref 26.0–34.0)
MCHC: 34.3 g/dL (ref 30.0–36.0)
MCV: 85.4 fL (ref 80.0–100.0)
Monocytes Absolute: 1.7 10*3/uL — ABNORMAL HIGH (ref 0.1–1.0)
Monocytes Relative: 13 %
Neutro Abs: 9.7 10*3/uL — ABNORMAL HIGH (ref 1.7–7.7)
Neutrophils Relative %: 75 %
Platelet Count: 108 10*3/uL — ABNORMAL LOW (ref 150–400)
RBC: 5.95 MIL/uL — ABNORMAL HIGH (ref 4.22–5.81)
RDW: 17.2 % — ABNORMAL HIGH (ref 11.5–15.5)
WBC Count: 12.8 10*3/uL — ABNORMAL HIGH (ref 4.0–10.5)
nRBC: 0 % (ref 0.0–0.2)

## 2021-09-19 MED ORDER — ACETAMINOPHEN 325 MG PO TABS
650.0000 mg | ORAL_TABLET | Freq: Once | ORAL | Status: AC
  Administered 2021-09-19: 650 mg via ORAL
  Filled 2021-09-19: qty 2

## 2021-09-19 MED ORDER — ACETAMINOPHEN 325 MG PO TABS: 650.0000 mg | ORAL_TABLET | Freq: Once | ORAL | Status: DC

## 2021-09-19 MED ORDER — DIPHENHYDRAMINE HCL 25 MG PO CAPS
25.0000 mg | ORAL_CAPSULE | Freq: Once | ORAL | Status: AC
  Administered 2021-09-19: 25 mg via ORAL
  Filled 2021-09-19: qty 1

## 2021-09-19 MED ORDER — SODIUM CHLORIDE 0.9 % IV SOLN: Freq: Once | INTRAVENOUS | Status: AC

## 2021-09-19 MED ORDER — SODIUM CHLORIDE 0.9 % IV SOLN
5.0000 mg/kg | Freq: Once | INTRAVENOUS | Status: AC
  Administered 2021-09-19: 400 mg via INTRAVENOUS
  Filled 2021-09-19: qty 40

## 2021-09-19 NOTE — Progress Notes (Signed)
Hematology and Oncology Follow Up Visit  Tyler Mason [id number] 1950/01/20 71 y.o. 09/19/2021 12:02 PM Tyler Mason, MDHusain, Tyler Ar, MD   Principle Diagnosis: 71 year old man with kidney cancer diagnosed in January 2023.  He was found to have stage IV disease with pelvic adenopathy.   Prior Therapy:  He is status post biopsy completed on January 22, 2021 of the retroperitoneal lymph node.  Ipilimumab 1 mg/kg with nivolumab 240 mg total dose started on February 08, 2021.  He completed 4 cycles of therapy in April 2023.  Nivolumab 480 mg maintenance of monthly started in April 2023.  Therapy discontinued in July 2023 due to autoimmune colitis.  He is status post Remicade infusion August 30, 2021.  Current therapy: Prednisone 60 mg daily.  He will receive the second cycle of Remicade today.    Interim History: Tyler Mason presents today for a follow-up evaluation.  Since last visit, he developed worsening autoimmune colitis and was refractory to prednisone.  He received Remicade infusion on August 25.  Since that time, he has reported improvement in his diarrhea currently has loose bowel habits 2-3 times a day.  Due to the diarrhea, he has reported more fatigue tiredness and quite a debilitation.  He is requiring a lot of assistance from his wife for ambulation and transfer.  He is no longer able to ambulate.  He used to.  He denies any abdominal pain or discomfort.  He is eating well and is moving more weight.   Medications: Updated on review. Current Outpatient Medications  Medication Sig Dispense Refill   acetaminophen (TYLENOL) 325 MG tablet Take 2 tablets (650 mg total) by mouth every 4 (four) hours as needed for mild pain.     ARIPiprazole (ABILIFY) 5 MG tablet TAKE 1 TABLET BY MOUTH ONCE DAILY IN THE MORNING. 90 tablet 4   ARIPiprazole (ABILIFY) 5 MG tablet Take 1 tablet by mouth every morning 90 tablet 4   ARIPiprazole (ABILIFY) 5 MG tablet Take 1 tablet (5 mg total) by mouth  every morning. 90 tablet 4   buPROPion (WELLBUTRIN XL) 150 MG 24 hr tablet Take 3 tablets (450 mg total) by mouth in the morning. 90 tablet 2   buPROPion (WELLBUTRIN XL) 300 MG 24 hr tablet Take 1 tablet (300 mg total) by mouth every morning. 90 tablet 2   buPROPion (WELLBUTRIN XL) 300 MG 24 hr tablet Take 1 tablet by mouth every morning 90 tablet 2   clindamycin (CLEOCIN) 150 MG capsule Take 1 capsule (150 mg total) by mouth 4 (four) times daily until gone, start 2 days before surgery 30 capsule 0   diphenoxylate-atropine (LOMOTIL) 2.5-0.025 MG tablet Take 1 tablet by mouth 4 (four) times daily as needed for diarrhea or loose stools. 30 tablet 0   diphenoxylate-atropine (LOMOTIL) 2.5-0.025 MG tablet Take 1 tablet by mouth 4 times daily as needed for diarrhea or loose stool 30 tablet 0   donepezil (ARICEPT) 10 MG tablet Take 1 tablet daily 30 tablet 11   doxycycline (VIBRA-TABS) 100 MG tablet Take one tablet by mouth twice daily for 5 days 10 tablet 0   ibuprofen (ADVIL) 400 MG tablet Take 1 tablet by mouth every 4  hours as needed for moderate pain. (Patient not taking: Reported on 06/18/2021) 30 tablet 0   levothyroxine (SYNTHROID) 75 MCG tablet Take 1 tablet by mouth every morning on an empty stomach 90 tablet 3   Melatonin 5 MG CAPS Take by mouth at bedtime.  pantoprazole (PROTONIX) 40 MG tablet Take 1 tablet (40 mg total) by mouth daily. 90 tablet 3   potassium chloride SA (KLOR-CON M) 20 MEQ tablet Take 1 tablet (20 mEq total) by mouth daily. 30 tablet 2   predniSONE (DELTASONE) 20 MG tablet Take 3 tablets (60 mg total) by mouth daily with breakfast. 90 tablet 1   prochlorperazine (COMPAZINE) 10 MG tablet Take 1 tablet (10 mg total) by mouth every 6 (six) hours as needed for nausea or vomiting. 30 tablet 0   simvastatin (ZOCOR) 20 MG tablet TAKE 1 TABLET BY MOUTH ONCE DAILY 90 tablet 3   simvastatin (ZOCOR) 20 MG tablet TAKE 1 TABLET BY MOUTH ONCE DAILY 90 tablet 4   simvastatin (ZOCOR) 20  MG tablet TAKE 1 TABLET BY MOUTH ONCE DAILY 90 days 90 tablet 0   simvastatin (ZOCOR) 20 MG tablet TAKE 1 TABLET BY MOUTH ONCE DAILY 90 tablet 4   vitamin B-12 (CYANOCOBALAMIN) 1000 MCG tablet Take 1,000 mcg by mouth daily.     warfarin (COUMADIN) 5 MG tablet Take 1 and 1/2 tablets by mouth on Friday and Sunday. Take 1 tablet by mouth on all other days. 96 tablet 3   No current facility-administered medications for this visit.     Allergies:  Allergies  Allergen Reactions   Ampicillin Other (See Comments)    Made tongue sore Has patient had a PCN reaction causing immediate rash, facial/tongue/throat swelling, SOB or lightheadedness with hypotension: no Has patient had a PCN reaction causing severe rash involving mucus membranes or skin necrosis: no Has patient had a PCN reaction that required hospitalization no Has patient had a PCN reaction occurring within the last 10 years: no If all of the above answers are "NO", then may proceed with Cephalosporin use.    Macrodantin [Nitrofurantoin Macrocrystal] Itching, Swelling and Rash     Physical Exam:    Blood pressure (!) 130/91, pulse (!) 102, temperature 98.2 F (36.8 C), temperature source Temporal, resp. rate 14, height '5\' 10"'$  (1.778 m), weight 175 lb 1.6 oz (79.4 kg), SpO2 96 %.     ECOG: 1    General appearance: Alert, awake without any distress. Head: Atraumatic without abnormalities Oropharynx: Without any thrush or ulcers. Eyes: No scleral icterus. Lymph nodes: No lymphadenopathy noted in the cervical, supraclavicular, or axillary nodes Heart:regular rate and rhythm, without any murmurs or gallops.   Lung: Clear to auscultation without any rhonchi, wheezes or dullness to percussion. Abdomin: Soft, nontender without any shifting dullness or ascites. Musculoskeletal: No clubbing or cyanosis. Neurological: No motor or sensory deficits. Skin: No rashes or lesions.        Lab Results: Lab Results  Component  Value Date   WBC 12.8 (H) 09/19/2021   HGB 17.4 (H) 09/19/2021   HCT 50.8 09/19/2021   MCV 85.4 09/19/2021   PLT 108 (L) 09/19/2021     Chemistry      Component Value Date/Time   NA 144 09/03/2021 0855   K 3.0 (L) 09/03/2021 0855   CL 108 09/03/2021 0855   CO2 31 09/03/2021 0855   BUN 22 09/03/2021 0855   CREATININE 1.25 (H) 09/03/2021 0855      Component Value Date/Time   CALCIUM 8.8 (L) 09/03/2021 0855   ALKPHOS 80 09/03/2021 0855   AST 35 09/03/2021 0855   ALT 51 (H) 09/03/2021 0855   BILITOT 0.6 09/03/2021 0855      I     Impression and Plan:  64 year old with:  1.  Kidney cancer diagnosed in January 2023.  He was found to have stage IV disease with retroperitoneal adenopathy.  He is currently on active surveillance after achieving a near complete response based on imaging studies in August 2023.  With the complications that experiencing from immunotherapy have recommended no additional treatment.  We will update his staging scan in 5 months.   2.  Autoimmune colitis that is refractory to prednisone and currently on Remicade.  His symptoms continues to improve at this time although not completely resolved.  We will repeat Remicade today and will discontinue after that.   3.  Antiemetics: No nausea or vomiting reported at this time.  Compazine is available to him.   4.  Immune mediated complications: He has not experienced any other complications other than colitis.   5.  Recurrent venous thromboembolism: He remains on warfarin with.  No bleeding or thrombosis noted.   6.  Hypokalemia: Related to his diarrhea continues to be on potassium supplements.  This will be repeated today.  Last potassium was normal last week.  7.  Debilitation and weakness: We will make referral to home care for physical therapy.   8.  Follow-up: In 4 weeks for a follow-up.  30  minutes were dedicated to this encounter.  The time was spent on reviewing laboratory data, disease status  update, treatment choices and addressing complications related to cancer and cancer therapy.     Zola Button, MD 9/14/202312:02 PM

## 2021-09-19 NOTE — Progress Notes (Signed)
Per Dr. Alen Blew, pt. to receive Remicad only today no Opdivo.

## 2021-09-19 NOTE — Telephone Encounter (Signed)
PC to Lillia Mountain with [facility], informed her of home health PT referral for this patient.  She states she will be contacting patient.

## 2021-09-19 NOTE — Patient Instructions (Signed)
Infliximab Injection What is this medication? INFLIXIMAB (in [place] i mab) treats autoimmune conditions, such as psoriasis, arthritis, Crohn's disease, and ulcerative colitis. It works by slowing down an overactive immune system. It belongs to a group of medications called TNF inhibitors. It is a monoclonal antibody. This medicine may be used for other purposes; ask your health care provider or pharmacist if you have questions. COMMON BRAND NAME(S): AVSOLA, INFLECTRA, IXIFI, Remicade, RENFLEXIS What should I tell my care team before I take this medication? They need to know if you have any of these conditions: Cancer Current or past resident of [place] or [place] Diabetes Exposure to tuberculosis Guillain-Barre syndrome Heart failure Liver disease Immune system problems Infection Lung or breathing disease, such as COPD Multiple sclerosis Receiving phototherapy for the skin Seizure disorder An unusual or allergic reaction to infliximab, mouse proteins, other medications, foods, dyes, or preservatives Pregnant or trying to get pregnant Breast-feeding How should I use this medication? This medication is injected into a vein. It is usually given by a care team in a hospital or clinic setting. A special MedGuide will be given to you by the pharmacist with each prescription and refill. Be sure to read this information carefully each time. Talk to your care team about the use of this medication in children. While it may be prescribed for children as young as 21 years of age for selected conditions, precautions do apply. Overdosage: If you think you have taken too much of this medicine contact a poison control center or emergency room at once. NOTE: This medicine is only for you. Do not share this medicine with others. What if I miss a dose? Keep appointments for follow-up doses. It is important not to miss your dose. Call your care team if you are unable to keep an  appointment. What may interact with this medication? Do not take this medication with any of the following: Biologic medications, such as abatacept, adalimumab, anakinra, certolizumab, etanercept, golimumab, rituximab, secukinumab, tocilizumab, tofactinib, ustekinumab Live vaccines This list may not describe all possible interactions. Give your health care provider a list of all the medicines, herbs, non-prescription drugs, or dietary supplements you use. Also tell them if you smoke, drink alcohol, or use illegal drugs. Some items may interact with your medicine. What should I watch for while using this medication? Visit your care team for regular checks on your progress. Your condition will be monitored carefully while you are receiving this medication. You may need blood work done while you are taking this medication. You will be tested for tuberculosis (TB) before you start this medication. If your care team prescribes any medication for TB, you should start taking the TB medication before starting this medication. Make sure to finish the full course of TB medication. This medication may increase your risk of getting an infection. Call your care team for advice if you get a fever, chills, sore throat, or other symptoms of a cold or flu. Do not treat yourself. Try to avoid being around people who are sick. This medication may make the symptoms of heart failure worse in some patients. Contact your care team right away if you develop signs or symptoms of heart failure. Before having surgery or dental work, talk to your care team to make sure it is ok. This medication can increase the risk of poor healing of your surgical site or wound. If you take this medication for plaque psoriasis, stay out of the sun. If you cannot avoid being  in the sun, wear protective clothing and sunscreen. Do not use sun lamps or tanning beds/booths. Talk to your care team about your risk of cancer. You may be more at risk for  certain types of cancers if you take this medication. What side effects may I notice from receiving this medication? Side effects that you should report to your care team as soon as possible: Allergic reactions--skin rash, itching, hives, swelling of the face, lips, tongue, or throat Body pain, tingling, or numbness Heart attack--pain or tightness in the chest, shoulders, arms, or jaw, nausea, shortness of breath, cold or clammy skin, feeling faint or lightheaded Heart failure--shortness of breath, swelling of the ankles, feet, or hands, sudden weight gain, unusual weakness or fatigue Heart rhythm changes--fast or irregular heartbeat, dizziness, feeling faint or lightheaded, chest pain, trouble breathing Increase in blood pressure Infection--fever, chills, cough, sore throat, wounds that don't heal, pain or trouble when passing urine, general feeling of discomfort or being unwell Liver injury--right upper belly pain, loss of appetite, nausea, light-colored stool, dark yellow or brown urine, yellowing skin or eyes, unusual weakness or fatigue Low blood pressure--dizziness, feeling faint or lightheaded, blurry vision Lupus-like syndrome--joint pain, swelling, or stiffness, butterfly-shaped rash on the face, rashes that get worse in the sun, fever, unusual weakness or fatigue Seizures Stroke--sudden numbness or weakness of the face, arm, or leg, trouble speaking, confusion, trouble walking, loss of balance or coordination, dizziness, severe headache, change in vision Sudden vision loss in one or both eyes Unusual bruising or bleeding Side effects that usually do not require medical attention (report to your care team if they continue or are bothersome): Cough Diarrhea Fatigue Headache Nausea Runny or stuffy nose Sore throat Stomach pain This list may not describe all possible side effects. Call your doctor for medical advice about side effects. You may report side effects to FDA at  1-800-FDA-1088. Where should I keep my medication? This medication is given in a hospital or clinic. It will not be stored at home. NOTE: This sheet is a summary. It may not cover all possible information. If you have questions about this medicine, talk to your doctor, pharmacist, or health care provider.  2023 Elsevier/Gold Standard (2021-03-07 00:00:00)

## 2021-09-20 ENCOUNTER — Other Ambulatory Visit: Payer: Self-pay

## 2021-09-20 ENCOUNTER — Emergency Department (HOSPITAL_COMMUNITY): Payer: PPO

## 2021-09-20 ENCOUNTER — Inpatient Hospital Stay (HOSPITAL_COMMUNITY): Payer: PPO

## 2021-09-20 ENCOUNTER — Telehealth: Payer: Self-pay | Admitting: Oncology

## 2021-09-20 ENCOUNTER — Inpatient Hospital Stay (HOSPITAL_COMMUNITY)
Admission: RE | Admit: 2021-09-20 | Discharge: 2021-10-02 | DRG: 871 | Disposition: A | Discharge status: Hospice | Payer: PPO | Attending: Internal Medicine | Admitting: Internal Medicine

## 2021-09-20 ENCOUNTER — Encounter (HOSPITAL_COMMUNITY): Payer: Self-pay

## 2021-09-20 DIAGNOSIS — G9340 Encephalopathy, unspecified: Secondary | ICD-10-CM | POA: Diagnosis not present

## 2021-09-20 DIAGNOSIS — R6510 Systemic inflammatory response syndrome (SIRS) of non-infectious origin without acute organ dysfunction: Secondary | ICD-10-CM | POA: Diagnosis present

## 2021-09-20 DIAGNOSIS — K521 Toxic gastroenteritis and colitis: Secondary | ICD-10-CM | POA: Diagnosis present

## 2021-09-20 DIAGNOSIS — N179 Acute kidney failure, unspecified: Secondary | ICD-10-CM | POA: Diagnosis present

## 2021-09-20 DIAGNOSIS — Z20822 Contact with and (suspected) exposure to covid-19: Secondary | ICD-10-CM | POA: Diagnosis present

## 2021-09-20 DIAGNOSIS — Z87891 Personal history of nicotine dependence: Secondary | ICD-10-CM

## 2021-09-20 DIAGNOSIS — D6862 Lupus anticoagulant syndrome: Secondary | ICD-10-CM | POA: Diagnosis present

## 2021-09-20 DIAGNOSIS — D849 Immunodeficiency, unspecified: Secondary | ICD-10-CM | POA: Diagnosis not present

## 2021-09-20 DIAGNOSIS — A419 Sepsis, unspecified organism: Secondary | ICD-10-CM | POA: Diagnosis present

## 2021-09-20 DIAGNOSIS — Z7952 Long term (current) use of systemic steroids: Secondary | ICD-10-CM

## 2021-09-20 DIAGNOSIS — G0490 Encephalitis and encephalomyelitis, unspecified: Secondary | ICD-10-CM | POA: Diagnosis present

## 2021-09-20 DIAGNOSIS — C7989 Secondary malignant neoplasm of other specified sites: Secondary | ICD-10-CM | POA: Diagnosis present

## 2021-09-20 DIAGNOSIS — R531 Weakness: Secondary | ICD-10-CM | POA: Diagnosis not present

## 2021-09-20 DIAGNOSIS — Z23 Encounter for immunization: Secondary | ICD-10-CM

## 2021-09-20 DIAGNOSIS — C641 Malignant neoplasm of right kidney, except renal pelvis: Secondary | ICD-10-CM | POA: Diagnosis present

## 2021-09-20 DIAGNOSIS — Y92009 Unspecified place in unspecified non-institutional (private) residence as the place of occurrence of the external cause: Secondary | ICD-10-CM

## 2021-09-20 DIAGNOSIS — Z87820 Personal history of traumatic brain injury: Secondary | ICD-10-CM

## 2021-09-20 DIAGNOSIS — Z79890 Hormone replacement therapy (postmenopausal): Secondary | ICD-10-CM | POA: Diagnosis not present

## 2021-09-20 DIAGNOSIS — G4733 Obstructive sleep apnea (adult) (pediatric): Secondary | ICD-10-CM | POA: Diagnosis not present

## 2021-09-20 DIAGNOSIS — R760 Raised antibody titer: Secondary | ICD-10-CM | POA: Diagnosis present

## 2021-09-20 DIAGNOSIS — C649 Malignant neoplasm of unspecified kidney, except renal pelvis: Secondary | ICD-10-CM | POA: Diagnosis not present

## 2021-09-20 DIAGNOSIS — R7401 Elevation of levels of liver transaminase levels: Secondary | ICD-10-CM | POA: Diagnosis present

## 2021-09-20 DIAGNOSIS — F03A3 Unspecified dementia, mild, with mood disturbance: Secondary | ICD-10-CM | POA: Diagnosis present

## 2021-09-20 DIAGNOSIS — Z79899 Other long term (current) drug therapy: Secondary | ICD-10-CM

## 2021-09-20 DIAGNOSIS — Z880 Allergy status to penicillin: Secondary | ICD-10-CM

## 2021-09-20 DIAGNOSIS — Z7189 Other specified counseling: Secondary | ICD-10-CM

## 2021-09-20 DIAGNOSIS — Z66 Do not resuscitate: Secondary | ICD-10-CM | POA: Diagnosis present

## 2021-09-20 DIAGNOSIS — R6520 Severe sepsis without septic shock: Secondary | ICD-10-CM | POA: Diagnosis present

## 2021-09-20 DIAGNOSIS — W19XXXA Unspecified fall, initial encounter: Secondary | ICD-10-CM | POA: Diagnosis not present

## 2021-09-20 DIAGNOSIS — R739 Hyperglycemia, unspecified: Secondary | ICD-10-CM | POA: Diagnosis not present

## 2021-09-20 DIAGNOSIS — E785 Hyperlipidemia, unspecified: Secondary | ICD-10-CM | POA: Diagnosis present

## 2021-09-20 DIAGNOSIS — Z8249 Family history of ischemic heart disease and other diseases of the circulatory system: Secondary | ICD-10-CM

## 2021-09-20 DIAGNOSIS — J690 Pneumonitis due to inhalation of food and vomit: Secondary | ICD-10-CM | POA: Diagnosis present

## 2021-09-20 DIAGNOSIS — R5381 Other malaise: Secondary | ICD-10-CM | POA: Diagnosis present

## 2021-09-20 DIAGNOSIS — Z86711 Personal history of pulmonary embolism: Secondary | ICD-10-CM

## 2021-09-20 DIAGNOSIS — G9341 Metabolic encephalopathy: Secondary | ICD-10-CM | POA: Diagnosis present

## 2021-09-20 DIAGNOSIS — D696 Thrombocytopenia, unspecified: Secondary | ICD-10-CM | POA: Diagnosis present

## 2021-09-20 DIAGNOSIS — K219 Gastro-esophageal reflux disease without esophagitis: Secondary | ICD-10-CM | POA: Diagnosis present

## 2021-09-20 DIAGNOSIS — R4182 Altered mental status, unspecified: Secondary | ICD-10-CM | POA: Diagnosis not present

## 2021-09-20 DIAGNOSIS — Z5181 Encounter for therapeutic drug level monitoring: Secondary | ICD-10-CM

## 2021-09-20 DIAGNOSIS — T451X5A Adverse effect of antineoplastic and immunosuppressive drugs, initial encounter: Secondary | ICD-10-CM | POA: Diagnosis present

## 2021-09-20 DIAGNOSIS — F329 Major depressive disorder, single episode, unspecified: Secondary | ICD-10-CM | POA: Diagnosis present

## 2021-09-20 DIAGNOSIS — E039 Hypothyroidism, unspecified: Secondary | ICD-10-CM | POA: Diagnosis present

## 2021-09-20 DIAGNOSIS — J181 Lobar pneumonia, unspecified organism: Secondary | ICD-10-CM | POA: Diagnosis not present

## 2021-09-20 DIAGNOSIS — E210 Primary hyperparathyroidism: Secondary | ICD-10-CM | POA: Diagnosis present

## 2021-09-20 DIAGNOSIS — Z515 Encounter for palliative care: Secondary | ICD-10-CM

## 2021-09-20 DIAGNOSIS — Z7901 Long term (current) use of anticoagulants: Secondary | ICD-10-CM

## 2021-09-20 DIAGNOSIS — F411 Generalized anxiety disorder: Secondary | ICD-10-CM | POA: Diagnosis present

## 2021-09-20 DIAGNOSIS — F03A4 Unspecified dementia, mild, with anxiety: Secondary | ICD-10-CM | POA: Diagnosis present

## 2021-09-20 DIAGNOSIS — Z86718 Personal history of other venous thrombosis and embolism: Secondary | ICD-10-CM

## 2021-09-20 DIAGNOSIS — J189 Pneumonia, unspecified organism: Secondary | ICD-10-CM | POA: Diagnosis not present

## 2021-09-20 DIAGNOSIS — R627 Adult failure to thrive: Secondary | ICD-10-CM | POA: Diagnosis present

## 2021-09-20 DIAGNOSIS — J9601 Acute respiratory failure with hypoxia: Secondary | ICD-10-CM | POA: Diagnosis present

## 2021-09-20 LAB — CBC WITH DIFFERENTIAL/PLATELET
Abs Immature Granulocytes: 0.13 10*3/uL — ABNORMAL HIGH (ref 0.00–0.07)
Basophils Absolute: 0.1 10*3/uL (ref 0.0–0.1)
Basophils Relative: 1 %
Eosinophils Absolute: 0 10*3/uL (ref 0.0–0.5)
Eosinophils Relative: 0 %
HCT: 54.3 % — ABNORMAL HIGH (ref 39.0–52.0)
Hemoglobin: 18 g/dL — ABNORMAL HIGH (ref 13.0–17.0)
Immature Granulocytes: 1 %
Lymphocytes Relative: 12 %
Lymphs Abs: 1.9 10*3/uL (ref 0.7–4.0)
MCH: 29 pg (ref 26.0–34.0)
MCHC: 33.1 g/dL (ref 30.0–36.0)
MCV: 87.4 fL (ref 80.0–100.0)
Monocytes Absolute: 2.3 10*3/uL — ABNORMAL HIGH (ref 0.1–1.0)
Monocytes Relative: 15 %
Neutro Abs: 11 10*3/uL — ABNORMAL HIGH (ref 1.7–7.7)
Neutrophils Relative %: 71 %
Platelets: 124 10*3/uL — ABNORMAL LOW (ref 150–400)
RBC: 6.21 MIL/uL — ABNORMAL HIGH (ref 4.22–5.81)
RDW: 18 % — ABNORMAL HIGH (ref 11.5–15.5)
WBC: 15.4 10*3/uL — ABNORMAL HIGH (ref 4.0–10.5)
nRBC: 0 % (ref 0.0–0.2)

## 2021-09-20 LAB — URINALYSIS, ROUTINE W REFLEX MICROSCOPIC
Bilirubin Urine: NEGATIVE
Glucose, UA: NEGATIVE mg/dL
Hgb urine dipstick: NEGATIVE
Ketones, ur: NEGATIVE mg/dL
Leukocytes,Ua: NEGATIVE
Nitrite: NEGATIVE
Protein, ur: NEGATIVE mg/dL
Specific Gravity, Urine: 1.019 (ref 1.005–1.030)
pH: 5 (ref 5.0–8.0)

## 2021-09-20 LAB — COMPREHENSIVE METABOLIC PANEL
ALT: 107 U/L — ABNORMAL HIGH (ref 0–44)
AST: 51 U/L — ABNORMAL HIGH (ref 15–41)
Albumin: 3.2 g/dL — ABNORMAL LOW (ref 3.5–5.0)
Alkaline Phosphatase: 97 U/L (ref 38–126)
Anion gap: 8 (ref 5–15)
BUN: 26 mg/dL — ABNORMAL HIGH (ref 8–23)
CO2: 24 mmol/L (ref 22–32)
Calcium: 9 mg/dL (ref 8.9–10.3)
Chloride: 106 mmol/L (ref 98–111)
Creatinine, Ser: 1.54 mg/dL — ABNORMAL HIGH (ref 0.61–1.24)
GFR, Estimated: 48 mL/min — ABNORMAL LOW (ref >60)
Glucose, Bld: 91 mg/dL (ref 70–99)
Potassium: 4.3 mmol/L (ref 3.5–5.1)
Sodium: 138 mmol/L (ref 135–145)
Total Bilirubin: 1.1 mg/dL (ref 0.3–1.2)
Total Protein: 5.9 g/dL — ABNORMAL LOW (ref 6.5–8.1)

## 2021-09-20 LAB — RESP PANEL BY RT-PCR (FLU A&B, COVID) ARPGX2
Influenza A by PCR: NEGATIVE
Influenza B by PCR: NEGATIVE
SARS Coronavirus 2 by RT PCR: NEGATIVE

## 2021-09-20 LAB — PROTIME-INR
INR: 1.5 — ABNORMAL HIGH (ref 0.8–1.2)
Prothrombin Time: 18.3 seconds — ABNORMAL HIGH (ref 11.4–15.2)

## 2021-09-20 LAB — LACTIC ACID, PLASMA: Lactic Acid, Venous: 1.9 mmol/L (ref 0.5–1.9)

## 2021-09-20 LAB — MRSA NEXT GEN BY PCR, NASAL: MRSA by PCR Next Gen: NOT DETECTED

## 2021-09-20 LAB — APTT: aPTT: 28 seconds (ref 24–36)

## 2021-09-20 LAB — AMMONIA: Ammonia: 18 umol/L (ref 9–35)

## 2021-09-20 MED ORDER — METHYLPREDNISOLONE SODIUM SUCC 40 MG IJ SOLR
20.0000 mg | INTRAMUSCULAR | Status: DC
  Administered 2021-09-20 – 2021-09-24 (×5): 20 mg via INTRAVENOUS
  Filled 2021-09-20 (×5): qty 1

## 2021-09-20 MED ORDER — BUPROPION HCL ER (XL) 150 MG PO TB24
450.0000 mg | ORAL_TABLET | Freq: Every morning | ORAL | Status: DC
  Administered 2021-09-22 – 2021-10-01 (×9): 450 mg via ORAL
  Filled 2021-09-20: qty 3
  Filled 2021-09-20: qty 1
  Filled 2021-09-20 (×9): qty 3

## 2021-09-20 MED ORDER — SODIUM CHLORIDE 0.9% FLUSH
3.0000 mL | Freq: Two times a day (BID) | INTRAVENOUS | Status: DC
  Administered 2021-09-20 – 2021-10-01 (×21): 3 mL via INTRAVENOUS

## 2021-09-20 MED ORDER — DIPHENOXYLATE-ATROPINE 2.5-0.025 MG PO TABS
1.0000 | ORAL_TABLET | Freq: Four times a day (QID) | ORAL | Status: DC | PRN
  Administered 2021-09-22: 1 via ORAL

## 2021-09-20 MED ORDER — SODIUM CHLORIDE 0.9 % IV SOLN: INTRAVENOUS | Status: DC

## 2021-09-20 MED ORDER — ENOXAPARIN SODIUM 80 MG/0.8ML IJ SOSY
80.0000 mg | PREFILLED_SYRINGE | Freq: Two times a day (BID) | INTRAMUSCULAR | Status: DC
  Administered 2021-09-20 – 2021-09-21 (×2): 80 mg via SUBCUTANEOUS
  Filled 2021-09-20 (×3): qty 0.8

## 2021-09-20 MED ORDER — ACETAMINOPHEN 325 MG PO TABS
650.0000 mg | ORAL_TABLET | Freq: Four times a day (QID) | ORAL | Status: DC | PRN
  Administered 2021-09-22 – 2021-09-26 (×3): 650 mg via ORAL
  Filled 2021-09-20 (×3): qty 2

## 2021-09-20 MED ORDER — SODIUM CHLORIDE 0.9 % IV SOLN: 2.0000 g | INTRAVENOUS | Status: DC

## 2021-09-20 MED ORDER — DONEPEZIL HCL 10 MG PO TABS
10.0000 mg | ORAL_TABLET | Freq: Every day | ORAL | Status: DC
  Administered 2021-09-21 – 2021-09-30 (×10): 10 mg via ORAL
  Filled 2021-09-20 (×11): qty 1

## 2021-09-20 MED ORDER — VANCOMYCIN HCL IN DEXTROSE 1-5 GM/200ML-% IV SOLN
1000.0000 mg | INTRAVENOUS | Status: DC
  Administered 2021-09-21 – 2021-09-23 (×3): 1000 mg via INTRAVENOUS
  Filled 2021-09-20 (×3): qty 200

## 2021-09-20 MED ORDER — ONDANSETRON HCL 4 MG PO TABS: 4.0000 mg | ORAL_TABLET | Freq: Four times a day (QID) | ORAL | Status: DC | PRN

## 2021-09-20 MED ORDER — ARIPIPRAZOLE 5 MG PO TABS
5.0000 mg | ORAL_TABLET | Freq: Every morning | ORAL | Status: DC
  Administered 2021-09-22 – 2021-09-26 (×4): 5 mg via ORAL
  Filled 2021-09-20 (×6): qty 1

## 2021-09-20 MED ORDER — VANCOMYCIN HCL 1500 MG/300ML IV SOLN
1500.0000 mg | Freq: Once | INTRAVENOUS | Status: DC
  Filled 2021-09-20: qty 300

## 2021-09-20 MED ORDER — VANCOMYCIN HCL 500 MG/100ML IV SOLN
500.0000 mg | Freq: Once | INTRAVENOUS | Status: AC
  Administered 2021-09-20: 500 mg via INTRAVENOUS
  Filled 2021-09-20: qty 100

## 2021-09-20 MED ORDER — ACETAMINOPHEN 650 MG RE SUPP
650.0000 mg | Freq: Four times a day (QID) | RECTAL | Status: DC | PRN
  Administered 2021-09-20 – 2021-09-21 (×3): 650 mg via RECTAL
  Filled 2021-09-20 (×3): qty 1

## 2021-09-20 MED ORDER — WARFARIN - PHARMACIST DOSING INPATIENT: Freq: Every day | Status: DC

## 2021-09-20 MED ORDER — LEVOTHYROXINE SODIUM 75 MCG PO TABS
75.0000 ug | ORAL_TABLET | Freq: Every day | ORAL | Status: DC
  Administered 2021-09-22 – 2021-10-01 (×9): 75 ug via ORAL
  Filled 2021-09-20 (×10): qty 1

## 2021-09-20 MED ORDER — SODIUM CHLORIDE 0.9 % IV BOLUS
1000.0000 mL | Freq: Once | INTRAVENOUS | Status: AC
  Administered 2021-09-20: 1000 mL via INTRAVENOUS

## 2021-09-20 MED ORDER — IOHEXOL 350 MG/ML SOLN
75.0000 mL | Freq: Once | INTRAVENOUS | Status: AC | PRN
  Administered 2021-09-20: 75 mL via INTRAVENOUS

## 2021-09-20 MED ORDER — SODIUM CHLORIDE 0.9 % IV SOLN
INTRAVENOUS | Status: DC
  Administered 2021-09-21: 1000 mL via INTRAVENOUS

## 2021-09-20 MED ORDER — SODIUM CHLORIDE 0.9 % IV SOLN
2.0000 g | Freq: Once | INTRAVENOUS | Status: AC
  Administered 2021-09-20: 2 g via INTRAVENOUS
  Filled 2021-09-20: qty 20

## 2021-09-20 MED ORDER — PREDNISONE 20 MG PO TABS: 40.0000 mg | ORAL_TABLET | Freq: Every day | ORAL | Status: DC

## 2021-09-20 MED ORDER — WARFARIN SODIUM 7.5 MG PO TABS
7.5000 mg | ORAL_TABLET | Freq: Once | ORAL | Status: DC
  Filled 2021-09-20: qty 1

## 2021-09-20 MED ORDER — SODIUM CHLORIDE 0.9 % IV SOLN
2.0000 g | Freq: Two times a day (BID) | INTRAVENOUS | Status: DC
  Administered 2021-09-20 – 2021-09-24 (×8): 2 g via INTRAVENOUS
  Filled 2021-09-20 (×8): qty 20

## 2021-09-20 MED ORDER — ONDANSETRON HCL 4 MG/2ML IJ SOLN: 4.0000 mg | Freq: Four times a day (QID) | INTRAMUSCULAR | Status: DC | PRN

## 2021-09-20 MED ORDER — MELATONIN 3 MG PO TABS
5.0000 mg | ORAL_TABLET | Freq: Every day | ORAL | Status: DC
  Administered 2021-09-21 – 2021-09-30 (×10): 4.5 mg via ORAL
  Filled 2021-09-20 (×10): qty 2

## 2021-09-20 MED ORDER — DEXTROSE 5 % IV SOLN
10.0000 mg/kg | Freq: Two times a day (BID) | INTRAVENOUS | Status: DC
  Administered 2021-09-20 – 2021-09-24 (×8): 730 mg via INTRAVENOUS
  Filled 2021-09-20 (×9): qty 14.6

## 2021-09-20 MED ORDER — ALBUTEROL SULFATE (2.5 MG/3ML) 0.083% IN NEBU: 2.5000 mg | INHALATION_SOLUTION | Freq: Four times a day (QID) | RESPIRATORY_TRACT | Status: DC | PRN

## 2021-09-20 MED ORDER — PANTOPRAZOLE SODIUM 40 MG PO TBEC
40.0000 mg | DELAYED_RELEASE_TABLET | Freq: Every day | ORAL | Status: DC
  Administered 2021-09-22 – 2021-10-01 (×10): 40 mg via ORAL
  Filled 2021-09-20 (×10): qty 1

## 2021-09-20 MED ORDER — VANCOMYCIN HCL IN DEXTROSE 1-5 GM/200ML-% IV SOLN
1000.0000 mg | Freq: Once | INTRAVENOUS | Status: AC
  Administered 2021-09-20: 1000 mg via INTRAVENOUS
  Filled 2021-09-20: qty 200

## 2021-09-20 NOTE — ED Provider Notes (Signed)
[facility] Provider Note   CSN: [id number] Arrival date & time: 09/20/21  0945     History {Add pertinent medical, surgical, social history, OB history to HPI:1} Chief Complaint  Patient presents with   Altered Mental Status    Tyler Mason is a 71 y.o. male.  Patient has a history of renal cancer.  He also has a history of colitis from his treatment for his renal cancer.  He got an infusion of Remicade yesterday.  According to his wife he seems very weak today had temperature 100.4 yesterday and is more confused than normal.   Weakness Severity:  Moderate Onset quality:  Sudden Timing:  Constant Progression:  Unchanged Chronicity:  New Context: not alcohol use   Relieved by:  Nothing Worsened by:  Nothing Ineffective treatments:  None tried Associated symptoms: dysuria   Associated symptoms: no abdominal pain, no chest pain, no cough, no diarrhea, no frequency, no headaches and no seizures        Home Medications Prior to Admission medications   Medication Sig Start Date End Date Taking? Authorizing Provider  acetaminophen (TYLENOL) 325 MG tablet Take 2 tablets (650 mg total) by mouth every 4 (four) hours as needed for mild pain. 12/28/20   [name], MD  ARIPiprazole (ABILIFY) 5 MG tablet TAKE 1 TABLET BY MOUTH ONCE DAILY IN THE MORNING. 01/31/20 03/24/21  Plovsky, [name], MD  ARIPiprazole (ABILIFY) 5 MG tablet Take 1 tablet by mouth every morning 03/28/21     ARIPiprazole (ABILIFY) 5 MG tablet Take 1 tablet (5 mg total) by mouth every morning. 07/18/21     buPROPion (WELLBUTRIN XL) 150 MG 24 hr tablet Take 3 tablets (450 mg total) by mouth in the morning. 07/18/21     buPROPion (WELLBUTRIN XL) 300 MG 24 hr tablet Take 1 tablet (300 mg total) by mouth every morning. 10/31/20     buPROPion (WELLBUTRIN XL) 300 MG 24 hr tablet Take 1 tablet by mouth every morning 03/28/21     clindamycin (CLEOCIN) 150 MG capsule Take 1 capsule (150  mg total) by mouth 4 (four) times daily until gone, start 2 days before surgery 07/23/21   Dorna Bloom, DDS  diphenoxylate-atropine (LOMOTIL) 2.5-0.025 MG tablet Take 1 tablet by mouth 4 (four) times daily as needed for diarrhea or loose stools. 08/29/21   Wyatt Portela, MD  diphenoxylate-atropine (LOMOTIL) 2.5-0.025 MG tablet Take 1 tablet by mouth 4 times daily as needed for diarrhea or loose stool 09/16/21   Wyatt Portela, MD  donepezil (ARICEPT) 10 MG tablet Take 1 tablet daily 06/18/21   Rondel Jumbo, PA-C  doxycycline (VIBRA-TABS) 100 MG tablet Take one tablet by mouth twice daily for 5 days 05/20/21   [name], MD  ibuprofen (ADVIL) 400 MG tablet Take 1 tablet by mouth every 4  hours as needed for moderate pain. Patient not taking: Reported on 06/18/2021 12/28/20   [name], MD  levothyroxine (SYNTHROID) 75 MCG tablet Take 1 tablet by mouth every morning on an empty stomach 05/27/21     Melatonin 5 MG CAPS Take by mouth at bedtime.    [provider]  pantoprazole (PROTONIX) 40 MG tablet Take 1 tablet (40 mg total) by mouth daily. 07/01/21     potassium chloride SA (KLOR-CON M) 20 MEQ tablet Take 1 tablet (20 mEq total) by mouth daily. 09/03/21   Wyatt Portela, MD  predniSONE (DELTASONE) 20 MG tablet Take 3 tablets (60 mg  total) by mouth daily with breakfast. 08/22/21   Wyatt Portela, MD  prochlorperazine (COMPAZINE) 10 MG tablet Take 1 tablet (10 mg total) by mouth every 6 (six) hours as needed for nausea or vomiting. 01/31/21   Wyatt Portela, MD  simvastatin (ZOCOR) 20 MG tablet TAKE 1 TABLET BY MOUTH ONCE DAILY 03/27/20 03/27/21  [name], MD  simvastatin (ZOCOR) 20 MG tablet TAKE 1 TABLET BY MOUTH ONCE DAILY 03/11/21     simvastatin (ZOCOR) 20 MG tablet TAKE 1 TABLET BY MOUTH ONCE DAILY 90 days 07/26/21     simvastatin (ZOCOR) 20 MG tablet TAKE 1 TABLET BY MOUTH ONCE DAILY 08/12/21     vitamin B-12 (CYANOCOBALAMIN) 1000 MCG tablet Take 1,000 mcg by mouth daily.     [provider]  warfarin (COUMADIN) 5 MG tablet Take 1 and 1/2 tablets by mouth on Friday and Sunday. Take 1 tablet by mouth on all other days. 08/12/21         Allergies    Ampicillin and Macrodantin [nitrofurantoin macrocrystal]    Review of Systems   Review of Systems  Constitutional:  Negative for appetite change and fatigue.  HENT:  Negative for congestion, ear discharge and sinus pressure.   Eyes:  Negative for discharge.  Respiratory:  Negative for cough.   Cardiovascular:  Negative for chest pain.  Gastrointestinal:  Negative for abdominal pain and diarrhea.  Genitourinary:  Positive for dysuria. Negative for frequency and hematuria.  Musculoskeletal:  Negative for back pain.  Skin:  Negative for rash.  Neurological:  Positive for weakness. Negative for seizures and headaches.  Psychiatric/Behavioral:  Negative for hallucinations.     Physical Exam Updated Vital Signs BP (!) 145/94   Pulse (!) 109   Temp 98.8 F (37.1 C) (Oral)   Resp (!) 22   Ht '5\' 10"'$  (1.778 m)   Wt 79.4 kg   SpO2 94%   BMI 25.12 kg/m  Physical Exam Vitals and nursing note reviewed.  Constitutional:      Appearance: He is well-developed.  HENT:     Head: Normocephalic.     Nose: Nose normal.  Eyes:     General: No scleral icterus.    Conjunctiva/sclera: Conjunctivae normal.  Neck:     Thyroid: No thyromegaly.     Comments: Supple Cardiovascular:     Rate and Rhythm: Regular rhythm. Tachycardia present.     Heart sounds: No murmur heard.    No friction rub. No gallop.  Pulmonary:     Breath sounds: No stridor. No wheezing or rales.  Chest:     Chest wall: No tenderness.  Abdominal:     General: There is no distension.     Tenderness: There is no abdominal tenderness. There is no rebound.  Musculoskeletal:        General: Normal range of motion.     Cervical back: Neck supple.  Lymphadenopathy:     Cervical: No cervical adenopathy.  Skin:    Findings: No erythema or  rash.  Neurological:     Mental Status: He is alert and oriented to person, place, and time.     Motor: No abnormal muscle tone.     Coordination: Coordination normal.     Comments: Patient is slow to answer questions  Psychiatric:        Behavior: Behavior normal.     ED Results / Procedures / Treatments   Labs (all labs ordered are listed, but only abnormal results are displayed)  Labs Reviewed  PROTIME-INR - Abnormal; Notable for the following components:      Result Value   Prothrombin Time 18.3 (*)    INR 1.5 (*)    All other components within normal limits  CBC WITH DIFFERENTIAL/PLATELET - Abnormal; Notable for the following components:   WBC 15.4 (*)    RBC 6.21 (*)    Hemoglobin 18.0 (*)    HCT 54.3 (*)    RDW 18.0 (*)    Platelets 124 (*)    Neutro Abs 11.0 (*)    Monocytes Absolute 2.3 (*)    Abs Immature Granulocytes 0.13 (*)    All other components within normal limits  COMPREHENSIVE METABOLIC PANEL - Abnormal; Notable for the following components:   BUN 26 (*)    Creatinine, Ser 1.54 (*)    Total Protein 5.9 (*)    Albumin 3.2 (*)    AST 51 (*)    ALT 107 (*)    GFR, Estimated 48 (*)    All other components within normal limits  RESP PANEL BY RT-PCR (FLU A&B, COVID) ARPGX2  CULTURE, BLOOD (ROUTINE X 2)  CULTURE, BLOOD (ROUTINE X 2)  URINE CULTURE  APTT  AMMONIA  URINALYSIS, ROUTINE W REFLEX MICROSCOPIC  LACTIC ACID, PLASMA  LACTIC ACID, PLASMA    EKG None  Radiology CT ABDOMEN PELVIS W CONTRAST  Result Date: 09/20/2021 CLINICAL DATA:  Abdominal pain. EXAM: CT ABDOMEN AND PELVIS WITH CONTRAST TECHNIQUE: Multidetector CT imaging of the abdomen and pelvis was performed using the standard protocol following bolus administration of intravenous contrast. RADIATION DOSE REDUCTION: This exam was performed according to the departmental dose-optimization program which includes automated exposure control, adjustment of the mA and/or kV according to patient  size and/or use of iterative reconstruction technique. CONTRAST:  12m OMNIPAQUE IOHEXOL 350 MG/ML SOLN COMPARISON:  None Available. FINDINGS: Lower chest: Small hiatal hernia. Hepatobiliary: No focal liver abnormality is seen. Cholelithiasis. No gallbladder wall thickening, or biliary dilatation. Pancreas: Unremarkable. No pancreatic ductal dilatation or surrounding inflammatory changes. Spleen: Normal in size without focal abnormality. Adrenals/Urinary Tract: Adrenal glands are unremarkable. There is a 9 mm right lower pole renal stone. Bladder is unremarkable. Stomach/Bowel: Stomach is within normal limits. Appendix appears normal. No evidence of bowel wall thickening, distention, or inflammatory changes. There is a small lipoma in the third portion of the duodenum (series 3, image 50). There is a small bowel small bowel intussusception in the left upper quadrant (series 7, image 47). There is a focal area of soft tissue stranding in the left lower quadrant (series 3, image 57) measuring 2.3 x 1.1 cm, previously 1.6 x 0.9 cm on 08/16/2021 and 2.2 x 0.9 cm on 04/26/2021. There is moderate stool in the rectal vault. Vascular/Lymphatic: No significant vascular findings are present. No enlarged abdominal or pelvic lymph nodes. Reproductive: Prostate is unremarkable. There is a right-sided hydrocele. Other: No abdominal wall hernia or abnormality. No abdominopelvic ascites. Musculoskeletal: Severe compression deformity at T7 IMPRESSION: 1. No definite cause for diffuse abdominal pain identified. 2. Redemonstrated 9 mm right lower pole nonobstructing renal stone. No evidence of hydronephrosis. 3. Redemonstrated nonspecific soft tissue along the anterior abdominal wall on the left unchanged compared to 04/26/2021; recommend attention on follow-up 4. There is small bowel small bowel intussusception in the left upper quadrant, which is likely transient and unlikely to be the etiology for patient's abdominal pain. 5.  Moderate stool in the rectal vault. 6. Small amount of fluid around the right testicle,  likely a small hydrocele Electronically Signed   By: Marin Roberts M.D.   On: 09/20/2021 13:40   DG Chest Port 1 View  Result Date: 09/20/2021 CLINICAL DATA:  Questionable sepsis. EXAM: PORTABLE CHEST 1 VIEW COMPARISON:  09/23/2018 FINDINGS: Lungs are hypoinflated without focal airspace consolidation or effusion. Cardiomediastinal silhouette and remainder of the exam is unchanged. IMPRESSION: Hypoinflation without acute cardiopulmonary disease. Electronically Signed   By: Marin Olp M.D.   On: 09/20/2021 11:34   CT Head Wo Contrast  Result Date: 09/20/2021 CLINICAL DATA:  Fall 3 days ago. Patient on blood thinners. Altered mental status. Head trauma. EXAM: CT HEAD WITHOUT CONTRAST TECHNIQUE: Contiguous axial images were obtained from the base of the skull through the vertex without intravenous contrast. RADIATION DOSE REDUCTION: This exam was performed according to the departmental dose-optimization program which includes automated exposure control, adjustment of the mA and/or kV according to patient size and/or use of iterative reconstruction technique. COMPARISON:  12/27/2020 FINDINGS: Brain: Ventricles, cisterns and other CSF spaces are unchanged as there is minimal age related atrophic change present. Mild chronic ischemic microvascular disease is present. No mass, mass effect, shift of midline structures or acute hemorrhage. No evidence of acute infarction. Mild chronic low attenuation of the right cerebellum likely chronic ischemic change. Vascular: No hyperdense vessel or unexpected calcification. Skull: Prior right frontotemporal craniotomy. Sinuses/Orbits: No acute finding. Other: None. IMPRESSION: 1. No acute findings. 2. Mild chronic ischemic microvascular disease and age related atrophic change. 3. Prior right frontotemporal craniotomy. Electronically Signed   By: Marin Olp M.D.   On: 09/20/2021 10:58     Procedures Procedures  {Document cardiac monitor, telemetry assessment procedure when appropriate:1}  Medications Ordered in ED Medications  vancomycin (VANCOCIN) IVPB 1000 mg/200 mL premix (1,000 mg Intravenous New Bag/Given 09/20/21 1448)  sodium chloride 0.9 % bolus 1,000 mL (0 mLs Intravenous Stopped 09/20/21 1431)  cefTRIAXone (ROCEPHIN) 2 g in sodium chloride 0.9 % 100 mL IVPB (0 g Intravenous Stopped 09/20/21 1216)  iohexol (OMNIPAQUE) 350 MG/ML injection 75 mL (75 mLs Intravenous Contrast Given 09/20/21 1310)    ED Course/ Medical Decision Making/ A&P  CRITICAL CARE Performed by: Milton Ferguson Total critical care time: 40 minutes Critical care time was exclusive of separately billable procedures and treating other patients. Critical care was necessary to treat or prevent imminent or life-threatening deterioration. Critical care was time spent personally by me on the following activities: development of treatment plan with patient and/or surrogate as well as nursing, discussions with consultants, evaluation of patient's response to treatment, examination of patient, obtaining history from patient or surrogate, ordering and performing treatments and interventions, ordering and review of laboratory studies, ordering and review of radiographic studies, pulse oximetry and re-evaluation of patient's condition.'                            I spoke to Dr. Alen Blew his oncologist.  He stated the patient does not talk much most of the time.  He recommended culturing the patient and starting him on antibiotics and admitting him to the hospital.  He did not feel like the patient needed to go to [place] Amount and/or Complexity of Data Reviewed Labs: ordered. Radiology: ordered. ECG/medicine tests: ordered.  Risk Prescription drug management.   Renal cancer with progressive weakness and possible infection.  He will be admitted to medicine  {Document critical  care time when appropriate:1} {Document review of labs  and clinical decision tools ie heart score, Chads2Vasc2 etc:1}  {Document your independent review of radiology images, and any outside records:1} {Document your discussion with family members, caretakers, and with consultants:1} {Document social determinants of health affecting pt's care:1} {Document your decision making why or why not admission, treatments were needed:1} Final Clinical Impression(s) / ED Diagnoses Final diagnoses:  None    Rx / DC Orders ED Discharge Orders     None

## 2021-09-20 NOTE — ED Provider Triage Note (Signed)
Emergency Medicine Provider Triage Evaluation Note  Tyler Mason , a 71 y.o. male with history of DVT/PE on warfarin, cirrhosis was evaluated in triage.  Pt is brought in by his wife for evaluation of altered mental status that started while he was getting an infusion at the cancer center yesterday.  Per family, patient was unable to walk, seemed acutely altered and unable to follow commands and has some slurred speech.  Wife states that he had a fall approximately 3 days ago where he slipped forward on his walker and fell forward, striking his head on the carpeted floor.  Endorses fever of 100.7 F last night.  Last INR 2.2.  Review of Systems  Positive:  Negative:   Physical Exam  BP (!) 146/94 (BP Location: Left Arm)   Pulse (!) 104   Temp 98.7 F (37.1 C) (Oral)   Resp 18   SpO2 97%  Gen:   Awake, no distress   Resp:  Normal effort  MSK:   Moves extremities without difficulty  Other:  Right pupil not reactive to light.  Right eye with deviation.  EOM intact  Medical Decision Making  Medically screening exam initiated at 10:30 AM.  Appropriate orders placed.  Murriel Mason was informed that the remainder of the evaluation will be completed by another provider, this initial triage assessment does not replace that evaluation, and the importance of remaining in the ED until their evaluation is complete.     Tyler Mason, [place] 09/20/21 1035

## 2021-09-20 NOTE — ED Notes (Signed)
Admitting provider Lara Mulch at bedside to evaluate patient. This RN updated provider on patient's current status change and fever.

## 2021-09-20 NOTE — ED Notes (Signed)
Per provider ok to hold off on ordered temp foley due to patient being able to use the urinal.
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
## 2021-09-20 NOTE — ED Triage Notes (Addendum)
Pt had a infusion at the cancer center yesterday then became altered. Pt per family was altered, unable to walk, urinary frequency and pt is unable to follow commands, had a fever last night of 100.7, speech is slurred. and is not acting himself. Pt was last seen at his baseline at 2100 on 09/19/2021  Pt had a fall 3 days ago, hit a carpeted fall, is on blood thinners, but denies LOC.  Pt reports pain to lower left quadrant, slow to respond, unable to perform serial tasks, and abnormal eye exam, right eye not responding to light.

## 2021-09-20 NOTE — Progress Notes (Addendum)
9/15: patient has had current mental status change and fever reported by nurse. Added acyclovir to regimen and ceftriaxone changed to q12 for concerns of meningitis.  Plan: Acyclovir '730mg'$  ('10mg'$ /kg IBW) q12h Maintenance fluid: on NS 115m/hr Monitor for signs of clinical improvement Monitor kidney function  JSandford Craze PharmD. [facility] PGY-1 09/20/2021 9:20 PM

## 2021-09-20 NOTE — Progress Notes (Signed)
Pharmacy Antibiotic Note  Tyler Mason is a 71 y.o. male admitted on 09/20/2021 with sepsis. Patient reportedly had a fever up to 100.4 prior to arrival, tachycardic,and had tachypnea. Lactic acid WNL. Urine and CXR clear without source of infection. Scr 1.54 (bl~1-1.2). WBC elevated, and afebrile. Pharmacy has been consulted for Vancomycin  dosing.  Plan: Vancomycin '1500mg'$  IV x1 dose Vancomycin '1000mg'$  IV every 24 hours. (eAUC 410) Ceftriaxone 2g IV q24h Monitor for signs of clinical improvement, WBC, and fever curve F/u Blood cultures  Height: '5\' 10"'$  (177.8 cm) Weight: 79.4 kg (175 lb 0.7 oz) IBW/kg (Calculated) : 73  Temp (24hrs), Avg:98.4 F (36.9 C), Min:98.2 F (36.8 C), Max:98.8 F (37.1 C)  Recent Labs  Lab 09/19/21 1148 09/20/21 1031 09/20/21 1033 09/20/21 1404  WBC 12.8* 15.4*  --   --   CREATININE 1.13  --  1.54*  --   LATICACIDVEN  --   --   --  1.9    Estimated Creatinine Clearance: 46.1 mL/min (A) (by C-G formula based on SCr of 1.54 mg/dL (H)).    Allergies  Allergen Reactions   Ampicillin Other (See Comments)    Made tongue sore Has patient had a PCN reaction causing immediate rash, facial/tongue/throat swelling, SOB or lightheadedness with hypotension: no Has patient had a PCN reaction causing severe rash involving mucus membranes or skin necrosis: no Has patient had a PCN reaction that required hospitalization no Has patient had a PCN reaction occurring within the last 10 years: no If all of the above answers are "NO", then may proceed with Cephalosporin use.    Macrodantin [Nitrofurantoin Macrocrystal] Itching, Swelling and Rash    Antimicrobials this admission: 9/15 Ceftriaxone >>  9/15 Vancomycin >>   Microbiology results: 9/15 BCx: pending 9/15 UCx: pending   Thank you for allowing pharmacy to be a part of this patient's care.  Sandford Craze, PharmD. [facility] Acute Care PGY-1 09/20/2021 3:38 PM
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
## 2021-09-20 NOTE — ED Notes (Signed)
IV team RN able to place IV but unable to obtain blood work. Patient being transported to CT scan at this time.

## 2021-09-20 NOTE — ED Notes (Signed)
Patient transported to CT 

## 2021-09-20 NOTE — Telephone Encounter (Signed)
Per 9/14 los called and spoke to pt wife about appointment

## 2021-09-20 NOTE — Progress Notes (Signed)
9/15 Elink following sepsis bundle.

## 2021-09-20 NOTE — ED Notes (Signed)
IV team at bedside at this time to obtain second PIV access and ordered blood cultures and lactic acid.

## 2021-09-20 NOTE — ED Notes (Signed)
Patient transported to MRI 

## 2021-09-20 NOTE — ED Notes (Signed)
Unable to obtain blood cultures and lactic acid at this time due to patient needing ultrasound guided IV access. IV team consult placed and notified team that patient was septic work up.

## 2021-09-21 ENCOUNTER — Inpatient Hospital Stay: Payer: Self-pay

## 2021-09-21 ENCOUNTER — Inpatient Hospital Stay (HOSPITAL_COMMUNITY): Payer: PPO

## 2021-09-21 DIAGNOSIS — R6510 Systemic inflammatory response syndrome (SIRS) of non-infectious origin without acute organ dysfunction: Secondary | ICD-10-CM

## 2021-09-21 DIAGNOSIS — F411 Generalized anxiety disorder: Secondary | ICD-10-CM | POA: Diagnosis not present

## 2021-09-21 DIAGNOSIS — F329 Major depressive disorder, single episode, unspecified: Secondary | ICD-10-CM | POA: Diagnosis not present

## 2021-09-21 DIAGNOSIS — N179 Acute kidney failure, unspecified: Secondary | ICD-10-CM | POA: Diagnosis not present

## 2021-09-21 DIAGNOSIS — G9340 Encephalopathy, unspecified: Secondary | ICD-10-CM | POA: Diagnosis not present

## 2021-09-21 LAB — CBC
HCT: 44.1 % (ref 39.0–52.0)
Hemoglobin: 15 g/dL (ref 13.0–17.0)
MCH: 29.3 pg (ref 26.0–34.0)
MCHC: 34 g/dL (ref 30.0–36.0)
MCV: 86.1 fL (ref 80.0–100.0)
Platelets: 89 10*3/uL — ABNORMAL LOW (ref 150–400)
RBC: 5.12 MIL/uL (ref 4.22–5.81)
RDW: 16.9 % — ABNORMAL HIGH (ref 11.5–15.5)
WBC: 10.8 10*3/uL — ABNORMAL HIGH (ref 4.0–10.5)
nRBC: 0 % (ref 0.0–0.2)

## 2021-09-21 LAB — COMPREHENSIVE METABOLIC PANEL
ALT: 71 U/L — ABNORMAL HIGH (ref 0–44)
AST: 35 U/L (ref 15–41)
Albumin: 2.4 g/dL — ABNORMAL LOW (ref 3.5–5.0)
Alkaline Phosphatase: 71 U/L (ref 38–126)
Anion gap: 11 (ref 5–15)
BUN: 26 mg/dL — ABNORMAL HIGH (ref 8–23)
CO2: 21 mmol/L — ABNORMAL LOW (ref 22–32)
Calcium: 7.7 mg/dL — ABNORMAL LOW (ref 8.9–10.3)
Chloride: 106 mmol/L (ref 98–111)
Creatinine, Ser: 1.44 mg/dL — ABNORMAL HIGH (ref 0.61–1.24)
GFR, Estimated: 52 mL/min — ABNORMAL LOW (ref >60)
Glucose, Bld: 75 mg/dL (ref 70–99)
Potassium: 4.3 mmol/L (ref 3.5–5.1)
Sodium: 138 mmol/L (ref 135–145)
Total Bilirubin: 0.9 mg/dL (ref 0.3–1.2)
Total Protein: 4.5 g/dL — ABNORMAL LOW (ref 6.5–8.1)

## 2021-09-21 LAB — GLUCOSE, CAPILLARY
Glucose-Capillary: 107 mg/dL — ABNORMAL HIGH (ref 70–99)
Glucose-Capillary: 67 mg/dL — ABNORMAL LOW (ref 70–99)
Glucose-Capillary: 107 mg/dL — ABNORMAL HIGH (ref 70–99)
Glucose-Capillary: 120 mg/dL — ABNORMAL HIGH (ref 70–99)

## 2021-09-21 LAB — HEPARIN LEVEL (UNFRACTIONATED): Heparin Unfractionated: >1.1 IU/mL — ABNORMAL HIGH (ref 0.30–0.70)

## 2021-09-21 LAB — T4: T4, Total: 5.4 ug/dL (ref 4.5–12.0)

## 2021-09-21 LAB — PROTIME-INR
INR: 1.6 — ABNORMAL HIGH (ref 0.8–1.2)
Prothrombin Time: 18.6 seconds — ABNORMAL HIGH (ref 11.4–15.2)

## 2021-09-21 MED ORDER — SODIUM CHLORIDE 0.9% FLUSH
10.0000 mL | INTRAVENOUS | Status: DC | PRN
  Administered 2021-09-22 – 2021-10-01 (×5): 10 mL

## 2021-09-21 MED ORDER — INFLUENZA VAC A&B SA ADJ QUAD 0.5 ML IM PRSY
0.5000 mL | PREFILLED_SYRINGE | INTRAMUSCULAR | Status: DC
  Filled 2021-09-21: qty 0.5

## 2021-09-21 MED ORDER — DEXTROSE 50 % IV SOLN
25.0000 mL | Freq: Once | INTRAVENOUS | Status: AC
  Administered 2021-09-21: 25 mL via INTRAVENOUS
  Filled 2021-09-21: qty 50

## 2021-09-21 MED ORDER — HEPARIN (PORCINE) 25000 UT/250ML-% IV SOLN
1250.0000 [IU]/h | INTRAVENOUS | Status: DC
  Administered 2021-09-21: 1250 [IU]/h via INTRAVENOUS
  Filled 2021-09-21: qty 250

## 2021-09-21 MED ORDER — CHLORHEXIDINE GLUCONATE CLOTH 2 % EX PADS
6.0000 | MEDICATED_PAD | Freq: Every day | CUTANEOUS | Status: DC
  Administered 2021-09-21 – 2021-10-02 (×12): 6 via TOPICAL

## 2021-09-21 NOTE — Progress Notes (Signed)
EEG complete - results pending 

## 2021-09-21 NOTE — Significant Event (Signed)
Patient started having fever spikes 103 F blood pressure 108/65 pulse 70/min respiration 18/min not in distress when examined at bedside.  Becoming more confused order MRI brain added acyclovir to the medications.  I reviewed patient's charts and medications.  In addition changed  p.o. prednisone to IV Solu-Medrol.  Patient is already on Lovenox instead of Coumadin.  Discussed with pharmacy.  We will keep patient n.p.o. for now since patient cannot eat reliably.  Gean Birchwood

## 2021-09-21 NOTE — Evaluation (Signed)
Physical Therapy Evaluation Patient Details Name: Tyler Mason MRN: [id number] DOB: 1950/09/08 Today's Date: 09/21/2021  History of Present Illness  71 yo male presenting 9/15 after 1 week of worsening confusion and weakness, admitted with sepsis of unclear etiology. MRI motion degraded but without acute findings. CXR without acute cardiopulmonary disease. PMH includes: GERD, hearing loss, major neurocognitive disorder, PNA, TBI (1995), recurrent VTE, stage IV kidney ca  Clinical Impression   Pt presents with generalized weakness, impaired cognition responding to yes/no only today, impaired balance, significant difficulty mobilizing, and decreased activity tolerance vs baseline. Pt to benefit from acute PT to address deficits. Pt requiring mod +2 for bed mobility and transfer into standing, very limited standing tolerance and incontinent of stool once standing. Pt's wife present for session, states she wants pt to d/c home if he can, recommending SNF conservatively based on today's session but will work towards home. PT to progress mobility as tolerated, and will continue to follow acutely.         Recommendations for follow up therapy are one component of a multi-disciplinary discharge planning process, led by the attending physician.  Recommendations may be updated based on patient status, additional functional criteria and insurance authorization.  Follow Up Recommendations Skilled nursing-short term rehab (<3 hours/day) Can patient physically be transported by private vehicle: Yes    Assistance Recommended at Discharge Frequent or constant Supervision/Assistance  Patient can return home with the following  A lot of help with walking and/or transfers;A lot of help with bathing/dressing/bathroom;Assist for transportation;Help with stairs or ramp for entrance    Equipment Recommendations None recommended by PT  Recommendations for Other Services       Functional Status Assessment  Patient has had a recent decline in their functional status and demonstrates the ability to make significant improvements in function in a reasonable and predictable amount of time.     Precautions / Restrictions Precautions Precautions: Fall Precaution Comments: EEG running, fecal incontinence Restrictions Weight Bearing Restrictions: No      Mobility  Bed Mobility Overal bed mobility: Needs Assistance Bed Mobility: Supine to Sit, Sit to Supine     Supine to sit: Mod assist, +2 for physical assistance Sit to supine: Mod assist, +2 for physical assistance   General bed mobility comments: mod +2 for trunk and LE management, step by step multimodal cuing to complete    Transfers Overall transfer level: Needs assistance Equipment used: 2 person hand held assist Transfers: Sit to/from Stand, Bed to chair/wheelchair/BSC Sit to Stand: Mod assist, +2 physical assistance   Step pivot transfers: Mod assist, +2 physical assistance       General transfer comment: mod +2 for power up, rise, steadying, and x2 steps towards the left with body-wide tremors noted    Ambulation/Gait                  Stairs            Wheelchair Mobility    Modified Rankin (Stroke Patients Only)       Balance Overall balance assessment: Needs assistance Sitting-balance support: No upper extremity supported, Feet supported Sitting balance-Leahy Scale: Fair Sitting balance - Comments: initially requiring truncal support, transitioning to supervision   Standing balance support: Bilateral upper extremity supported, During functional activity Standing balance-Leahy Scale: Poor Standing balance comment: reliant on HHA +2  Pertinent Vitals/Pain Pain Assessment Pain Assessment: Faces Faces Pain Scale: Hurts a little bit Pain Location: generalized Pain Descriptors / Indicators: Discomfort, Guarding Pain Intervention(s): Limited activity within  patient's tolerance, Monitored during session, Repositioned    Home Living Family/patient expects to be discharged to:: Private residence Living Arrangements: Spouse/significant other;Children Available Help at Discharge: Family;Available 24 hours/day Type of Home: House Home Access: Stairs to enter Entrance Stairs-Rails:  (grab bar on the L) Entrance Stairs-Number of Steps: 3   Home Layout: One level Home Equipment: Shower seat;Wheelchair - Publishing copy (2 wheels);Grab bars - tub/shower      Prior Function Prior Level of Function : Needs assist             Mobility Comments: using RW for mobility PTA ADLs Comments: Pt independent with dressing, bathing until colitis started 08/06/2021.     Hand Dominance   Dominant Hand: Right    Extremity/Trunk Assessment   Upper Extremity Assessment Upper Extremity Assessment: Defer to OT evaluation    Lower Extremity Assessment Lower Extremity Assessment: Generalized weakness    Cervical / Trunk Assessment Cervical / Trunk Assessment: Normal  Communication   Communication: HOH  Cognition Arousal/Alertness: Awake/alert Behavior During Therapy: Flat affect Overall Cognitive Status: Impaired/Different from baseline Area of Impairment: Attention, Following commands, Safety/judgement, Problem solving                   Current Attention Level: Focused   Following Commands: Follows one step commands inconsistently, Follows one step commands with increased time Safety/Judgement: Decreased awareness of deficits, Decreased awareness of safety   Problem Solving: Slow processing, Requires verbal cues, Requires tactile cues, Difficulty sequencing, Decreased initiation General Comments: Very flat affect, responds most consistently to yes/no questions. Pauses in response time up to 10 seconds. Per pt's wife, this has been the case since Thursday        General Comments General comments (skin integrity, edema, etc.):  HRmax 113 bpm in standing, incontinent of stool standing EOB requiring return to supine for cleanup    Exercises     Assessment/Plan    PT Assessment Patient needs continued PT services  PT Problem List Decreased strength;Decreased mobility;Decreased activity tolerance;Decreased balance;Decreased knowledge of use of DME;Pain;Decreased safety awareness;Cardiopulmonary status limiting activity;Decreased knowledge of precautions;Decreased cognition       PT Treatment Interventions DME instruction;Therapeutic activities;Gait training;Therapeutic exercise;Patient/family education;Balance training;Stair training;Functional mobility training;Neuromuscular re-education    PT Goals (Current goals can be found in the Care Plan section)  Acute Rehab PT Goals Patient Stated Goal: home if possible PT Goal Formulation: With patient Time For Goal Achievement: 10/05/21 Potential to Achieve Goals: Fair    Frequency Min 3X/week     Co-evaluation PT/OT/SLP Co-Evaluation/Treatment: Yes Reason for Co-Treatment: For patient/therapist safety;To address functional/ADL transfers PT goals addressed during session: Mobility/safety with mobility;Balance         AM-PAC PT "6 Clicks" Mobility  Outcome Measure Help needed turning from your back to your side while in a flat bed without using bedrails?: A Lot Help needed moving from lying on your back to sitting on the side of a flat bed without using bedrails?: A Lot Help needed moving to and from a bed to a chair (including a wheelchair)?: A Lot Help needed standing up from a chair using your arms (e.g., wheelchair or bedside chair)?: A Lot Help needed to walk in hospital room?: Total Help needed climbing 3-5 steps with a railing? : Total 6 Click Score: 10    End of  Session   Activity Tolerance: Patient limited by fatigue Patient left: in bed;with call bell/phone within reach;with bed alarm set;with family/visitor present;with nursing/sitter in  room Nurse Communication: Mobility status PT Visit Diagnosis: Other abnormalities of gait and mobility (R26.89);Muscle weakness (generalized) (M62.81)    Time: 1450-1520 PT Time Calculation (min) (ACUTE ONLY): 30 min   Charges:   PT Evaluation $PT Eval Low Complexity: 1 Low          Siraj Dermody S, PT DPT Acute Rehabilitation Services Pager [phone]  Office [phone]   Roxine Caddy E Ruffin Pyo 09/21/2021, 4:13 PM

## 2021-09-21 NOTE — Plan of Care (Signed)
  Problem: Education: Goal: Knowledge of General Education information will improve Description Including pain rating scale, medication(s)/side effects and non-pharmacologic comfort measures Outcome: Progressing   Problem: Health Behavior/Discharge Planning: Goal: Ability to manage health-related needs will improve Outcome: Progressing   
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
## 2021-09-21 NOTE — Progress Notes (Signed)
PICC insertion: Large purple bruise to R wrist on assessment. Blue/ purple bruise to R lateral upper arm prior to PICC insertion.

## 2021-09-21 NOTE — Progress Notes (Signed)
ANTICOAGULATION CONSULT NOTE - Follow Up Consult  Pharmacy Consult for heparin Indication:  history of DVT/PE  Allergies  Allergen Reactions   Ampicillin Other (See Comments)    Made tongue sore Has patient had a PCN reaction causing immediate rash, facial/tongue/throat swelling, SOB or lightheadedness with hypotension: no Has patient had a PCN reaction causing severe rash involving mucus membranes or skin necrosis: no Has patient had a PCN reaction that required hospitalization no Has patient had a PCN reaction occurring within the last 10 years: no If all of the above answers are "NO", then may proceed with Cephalosporin use.    Macrodantin [Nitrofurantoin Macrocrystal] Itching, Swelling and Rash    Patient Measurements: Height: '5\' 10"'$  (177.8 cm) Weight: 77.9 kg (171 lb 11.8 oz) IBW/kg (Calculated) : 73 Heparin Dosing Weight: 77.9 kg (Actual body weight)  Vital Signs: Temp: 99.2 F (37.3 C) (09/16 0800) Temp Source: Oral (09/16 0800) BP: 124/60 (09/16 0800) Pulse Rate: 60 (09/16 0500)  Labs: Recent Labs    09/19/21 1148 09/20/21 1031 09/20/21 1033 09/21/21 0639  HGB 17.4* 18.0*  --  15.0  HCT 50.8 54.3*  --  44.1  PLT 108* 124*  --  89*  APTT  --  28  --   --   LABPROT  --  18.3*  --  18.6*  INR  --  1.5*  --  1.6*  CREATININE 1.13  --  1.54* 1.44*   Estimated Creatinine Clearance: 49.3 mL/min (A) (by C-G formula based on SCr of 1.44 mg/dL (H)).  Medications:  Infusions:   sodium chloride 125 mL/hr at 09/21/21 0121   acyclovir Stopped (09/20/21 2335)   cefTRIAXone (ROCEPHIN)  IV 2 g (09/20/21 2349)   heparin     vancomycin     Assessment: 49 yoM with PMH of TBI, history of DVT/PE (on warfarin PTA), positive lupus anticoagulant, hyperparathyroidism s/p partial parathyroidectomy, depression and GERD who presented with altered mental status. Patient was on warfarin prior to admission and bridging with enoxaparin 80 mg twice daily. Last dose of enoxaparin 9/16  0615. Pharmacy consulted to transition from warfarin/enoxaparin to heparin for VTE treatment. Transitioning to heparin for possible LP in the next few days. After discussion with MD, will not bolus heparin due to LP. Hgb 15 and stable, plt 89 and decreasing.   Goal of Therapy:  Heparin level 0.3-0.7 units/ml Monitor platelets by anticoagulation protocol: Yes   Plan:  Start heparin 1250 units/hr on 9/16 at 1430. Check 6 hour heparin level at 2030.  Daily CBC, heparin level. Monitor for signs/symptoms of bleeding.  Jeneen Rinks, Pharm.D PGY1 Pharmacy Resident 09/21/2021 10:33 AM
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
## 2021-09-21 NOTE — Progress Notes (Signed)
Peripherally Inserted Central Catheter Placement  The IV Nurse has discussed with the patient and/or persons authorized to consent for the patient, the purpose of this procedure and the potential benefits and risks involved with this procedure.  The benefits include less needle sticks, lab draws from the catheter, and the patient may be discharged home with the catheter. Risks include, but not limited to, infection, bleeding, blood clot (thrombus formation), and puncture of an artery; nerve damage and irregular heartbeat and possibility to perform a PICC exchange if needed/ordered by physician.  Alternatives to this procedure were also discussed.  Bard Power PICC patient education guide, fact sheet on infection prevention and patient information card has been provided to patient /or left at bedside.  Wife at bedside gave consent.  PICC Placement Documentation  PICC Double Lumen 09/21/21 Right Brachial 37 cm 2 cm (Active)  Indication for Insertion or Continuance of Line Poor Vasculature-patient has had multiple peripheral attempts or PIVs lasting less than 24 hours 09/21/21 1304  Exposed Catheter (cm) 2 cm 09/21/21 1304  Site Assessment Clean, Dry, Intact 09/21/21 1304  Lumen #1 Status Flushed;Saline locked;Blood return noted 09/21/21 1304  Lumen #2 Status Flushed;Saline locked;Blood return noted 09/21/21 1304  Dressing Type Transparent;Securing device 09/21/21 1304  Dressing Status Antimicrobial disc in place;Clean, Dry, Intact 09/21/21 1304  Safety Lock Not Applicable 90/24/09 [id number]  Line Care Connections checked and tightened 09/21/21 1304  Line Adjustment (NICU/IV Team Only) No 09/21/21 1304  Dressing Intervention New dressing 09/21/21 1304  Dressing Change Due 09/28/21 09/21/21 1304       Rolena Infante 09/21/2021, 1:04 PM
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
## 2021-09-22 DIAGNOSIS — R4182 Altered mental status, unspecified: Secondary | ICD-10-CM

## 2021-09-22 DIAGNOSIS — R6510 Systemic inflammatory response syndrome (SIRS) of non-infectious origin without acute organ dysfunction: Secondary | ICD-10-CM | POA: Diagnosis not present

## 2021-09-22 DIAGNOSIS — R760 Raised antibody titer: Secondary | ICD-10-CM | POA: Diagnosis not present

## 2021-09-22 DIAGNOSIS — F329 Major depressive disorder, single episode, unspecified: Secondary | ICD-10-CM | POA: Diagnosis not present

## 2021-09-22 DIAGNOSIS — G9340 Encephalopathy, unspecified: Secondary | ICD-10-CM | POA: Diagnosis not present

## 2021-09-22 DIAGNOSIS — F411 Generalized anxiety disorder: Secondary | ICD-10-CM | POA: Diagnosis not present

## 2021-09-22 DIAGNOSIS — N179 Acute kidney failure, unspecified: Secondary | ICD-10-CM | POA: Diagnosis not present

## 2021-09-22 LAB — HEPARIN LEVEL (UNFRACTIONATED)
Heparin Unfractionated: >1.1 IU/mL — ABNORMAL HIGH (ref 0.30–0.70)
Heparin Unfractionated: 0.9 IU/mL — ABNORMAL HIGH (ref 0.30–0.70)

## 2021-09-22 LAB — GASTROINTESTINAL PANEL BY PCR, STOOL (REPLACES STOOL CULTURE)

## 2021-09-22 LAB — CBC WITH DIFFERENTIAL/PLATELET
Abs Immature Granulocytes: 0.06 10*3/uL (ref 0.00–0.07)
Basophils Absolute: 0 10*3/uL (ref 0.0–0.1)
Basophils Relative: 0 %
Eosinophils Absolute: 0 10*3/uL (ref 0.0–0.5)
Eosinophils Relative: 0 %
HCT: 41.5 % (ref 39.0–52.0)
Hemoglobin: 14.4 g/dL (ref 13.0–17.0)
Immature Granulocytes: 1 %
Lymphocytes Relative: 9 %
Lymphs Abs: 1 10*3/uL (ref 0.7–4.0)
MCH: 29.5 pg (ref 26.0–34.0)
MCHC: 34.7 g/dL (ref 30.0–36.0)
MCV: 85 fL (ref 80.0–100.0)
Monocytes Absolute: 0.7 10*3/uL (ref 0.1–1.0)
Monocytes Relative: 6 %
Neutro Abs: 9.5 10*3/uL — ABNORMAL HIGH (ref 1.7–7.7)
Neutrophils Relative %: 84 %
Platelets: 96 10*3/uL — ABNORMAL LOW (ref 150–400)
RBC: 4.88 MIL/uL (ref 4.22–5.81)
RDW: 16.4 % — ABNORMAL HIGH (ref 11.5–15.5)
WBC: 11.3 10*3/uL — ABNORMAL HIGH (ref 4.0–10.5)
nRBC: 0 % (ref 0.0–0.2)

## 2021-09-22 LAB — COMPREHENSIVE METABOLIC PANEL
ALT: 73 U/L — ABNORMAL HIGH (ref 0–44)
AST: 48 U/L — ABNORMAL HIGH (ref 15–41)
Albumin: 2.2 g/dL — ABNORMAL LOW (ref 3.5–5.0)
Alkaline Phosphatase: 64 U/L (ref 38–126)
Anion gap: 5 (ref 5–15)
BUN: 21 mg/dL (ref 8–23)
CO2: 22 mmol/L (ref 22–32)
Calcium: 7.7 mg/dL — ABNORMAL LOW (ref 8.9–10.3)
Chloride: 108 mmol/L (ref 98–111)
Creatinine, Ser: 1.22 mg/dL (ref 0.61–1.24)
GFR, Estimated: >60 mL/min (ref >60)
Glucose, Bld: 111 mg/dL — ABNORMAL HIGH (ref 70–99)
Potassium: 4.1 mmol/L (ref 3.5–5.1)
Sodium: 135 mmol/L (ref 135–145)
Total Bilirubin: 1 mg/dL (ref 0.3–1.2)
Total Protein: 4.3 g/dL — ABNORMAL LOW (ref 6.5–8.1)

## 2021-09-22 LAB — HEPATITIS PANEL, ACUTE
HCV Ab: NONREACTIVE
Hep A IgM: NONREACTIVE
Hep B C IgM: NONREACTIVE
Hepatitis B Surface Ag: NONREACTIVE

## 2021-09-22 LAB — GLUCOSE, CAPILLARY
Glucose-Capillary: 101 mg/dL — ABNORMAL HIGH (ref 70–99)
Glucose-Capillary: 107 mg/dL — ABNORMAL HIGH (ref 70–99)

## 2021-09-22 LAB — URINE CULTURE: Culture: 10000 — AB

## 2021-09-22 LAB — C DIFFICILE QUICK SCREEN W PCR REFLEX
C Diff antigen: NEGATIVE
C Diff interpretation: NOT DETECTED
C Diff toxin: NEGATIVE

## 2021-09-22 MED ORDER — DIPHENOXYLATE-ATROPINE 2.5-0.025 MG PO TABS
1.0000 | ORAL_TABLET | Freq: Once | ORAL | Status: DC
  Filled 2021-09-22: qty 1

## 2021-09-22 MED ORDER — QUETIAPINE FUMARATE 25 MG PO TABS
25.0000 mg | ORAL_TABLET | Freq: Every day | ORAL | Status: DC
  Administered 2021-09-22 – 2021-09-25 (×4): 25 mg via ORAL
  Filled 2021-09-22 (×4): qty 1

## 2021-09-22 MED ORDER — HEPARIN (PORCINE) 25000 UT/250ML-% IV SOLN
950.0000 [IU]/h | INTRAVENOUS | Status: AC
  Administered 2021-09-22: 950 [IU]/h via INTRAVENOUS
  Filled 2021-09-22: qty 250
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
## 2021-09-22 NOTE — Procedures (Signed)
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 EEG to evaluate for seizure  Level of alertness: Awake, asleep  AEDs during EEG study: None  Technical aspects: This EEG study was done with scalp electrodes positioned according to the 10-20 International system of electrode placement. Electrical activity was reviewed with band pass filter of 1-'70Hz'$ , sensitivity of 7 uV/mm, display speed of 23m/sec with a '60Hz'$  notched filter applied as appropriate. EEG data were recorded continuously and digitally stored.  Video monitoring was available and reviewed as appropriate.  Description: The posterior dominant rhythm consists of 7.'5Hz'$  activity of moderate voltage (25-35 uV) seen predominantly in posterior head regions, symmetric and reactive to eye opening and eye closing. Sleep was characterized by vertex waves, sleep spindles (12 to 14 Hz), maximal frontocentral region. EEG showed continuous generalized 3 to 6 Hz theta-delta slowing. There was right fronto-temporal high amplitude 3-'6Hz'$  theta-delta slowing noted consistent with breach artifact.  Hyperventilation and photic stimulation were not performed.     Of note study is technically difficult due to significant electrode artifact.  ABNORMALITY - Continuous slow, generalized - Breach artifact, right fronto-temporal  IMPRESSION: This technically difficult study is suggestive of cortical dysfunction arising from right fronto-temporal consistent with prior craniotomy. Additionally there is  moderate diffuse encephalopathy, nonspecific etiology. No seizures or epileptiform discharges were seen throughout the recording.  Tyler Mason OBarbra [name]

## 2021-09-22 NOTE — Progress Notes (Signed)
PROGRESS NOTE        PATIENT DETAILS Name: Tyler Mason Age: 71 y.o. Sex: male Date of Birth: 14-Jun-1950 Admit Date: 09/20/2021 Admitting Physician Rise Patience, MD HYI:FOYDXA, [name], MD  Brief Summary: Patient is a 71 y.o.  male with history of recurrent VTE, stage IV kidney cancer-on prednisone/Remicade infusion since August 30, 2021 (previously on nivolumab-discontinued due to autoimmune colitis)- presented with almost 1 week history of worsening confusion/weakness-which rapidly worsened following a Remicade infusion on 9/14.  He was also febrile on admission.  He was subsequently admitted to the hospitalist service for further evaluation and treatment.  Significant events: 9/15>> gradual confusion/weakness x1 week rapid progression following second Remicade infusion on 9/14.  Febrile.  Admit to [facility].  Significant studies: 9/15>> CT head: No acute findings. 9/15>> CT abdomen/pelvis: No acute findings. 9/15>> CXR: No PNA 9/15>> MRI brain: No acute intracranial abnormality 9/16-9/17>> LTM EEG: No seizures.  Significant microbiology data: 9/15>> COVID/influenza PCR: Negative 9/15>> blood culture: Negative 9/15>> urine culture: 10,000 colonies/mL gram-negative rods 9/17>> stool C. difficile PCR: Negative 9/17>> GI pathogen panel: Pending  Procedures: None  Consults: ID, neurology  Subjective: Per spouse-had significant amount of delirium last night.  Febrile last night as well.  Sleepy but awakes-follows commands-denies chest pain or shortness of breath.  Objective: Vitals: Blood pressure 114/65, pulse 67, temperature 99.6 F (37.6 C), temperature source Axillary, resp. rate 17, height '5\' 10"'$  (1.778 m), weight 77.9 kg, SpO2 93 %.   Exam: Gen Exam: Sleepy but not in any distress. HEENT:atraumatic, normocephalic Chest: B/L clear to auscultation anteriorly CVS:S1S2 regular Abdomen:soft non tender, non distended Extremities:no  edema Neurology: Generalized weakness but non focal Skin: no rash   Pertinent Labs/Radiology:    Latest Ref Rng & Units 09/22/2021    4:01 AM 09/21/2021    6:39 AM 09/20/2021   10:31 AM  CBC  WBC 4.0 - 10.5 K/uL 11.3  10.8  15.4   Hemoglobin 13.0 - 17.0 g/dL 14.4  15.0  18.0   Hematocrit 39.0 - 52.0 % 41.5  44.1  54.3   Platelets 150 - 400 K/uL 96  89  124     Lab Results  Component Value Date   NA 135 09/22/2021   K 4.1 09/22/2021   CL 108 09/22/2021   CO2 22 09/22/2021      Assessment/Plan: Sepsis: Immunocompromised-still febrile-concern for encephalitis-no other sources of infection apparent-see work-up above.  Remains on empiric Rocephin/vancomycin/acyclovir-ID/neurology following.  Lumbar puncture planned later today.    Acute metabolic encephalopathy: Had significant delirium last night-neuroimaging negative for acute abnormalities.  EEG negative for seizures.  We will start low-dose Seroquel/melatonin and see how he does.  AKI: Likely hemodynamically mediated-resolved with supportive care.   UA negative for proteinuria, no hydronephrosis noted on CT abdomen.  Transaminitis: Suspect related to sepsis physiology-improving.  Monitor periodically.  No major abnormalities seen on CT imaging.  History of recurrent VTE: Continue IV heparin-once all procedures are completed-we can resume Coumadin.    History of stage IV cancer involving right kidney: Received second dose of Rituxan on 9/14-also on prednisone-currently on stress dose steroids.  History of autoimmune colitis: This was thought to be secondary to nivolumab-which has since been discontinued since in July 2023.  He has diarrhea at baseline.  Stool studies negative so far-continue as needed Lomotil.    GERD:  PPI  Hypothyroidism: Continue levothyroxine  HLD: Statin on hold due to transaminitis.  Resume over the next few days once LFTs have stabilized further.  Anxiety/depression: On Wellbutrin/Abilify  History of  mild dementia/neurocognitive dysfunction: On Aricept-follows with Dr. Delice Lesch.  Maintain delirium precautions.  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  Required craniotomy.  OSA: CPAP nightly  Difficult IV access/phlebotomy: Place PICC line-thankfully blood cultures are negative.  Needs dedicated IV access for antimicrobial therapy and lab monitoring.  BMI: Estimated body mass index is 24.64 kg/m as calculated from the following:   Height as of this encounter: '5\' 10"'$  (1.778 m).   Weight as of this encounter: 77.9 kg.   Code status:   Code Status: Full Code   DVT Prophylaxis:  IV heparin    Family Communication: Spouse at bedside   Disposition Plan: Status is: Inpatient Remains inpatient appropriate because: Fever/confusion-on multiple antimicrobial therapy-not stable for discharge-see above.   Planned Discharge Destination: Not known.   Diet: Diet Order             DIET - DYS 1 Room service appropriate? No; Fluid consistency: Thin  Diet effective now                     Antimicrobial agents: Anti-infectives (From admission, onward)    Start     Dose/Rate Route Frequency Ordered Stop   09/21/21 1600  vancomycin (VANCOCIN) IVPB 1000 mg/200 mL premix        1,000 mg 200 mL/hr over 60 Minutes Intravenous Every 24 hours 09/20/21 1539     09/21/21 1100  cefTRIAXone (ROCEPHIN) 2 g in sodium chloride 0.9 % 100 mL IVPB  Status:  Discontinued        2 g 200 mL/hr over 30 Minutes Intravenous Every 24 hours 09/20/21 1519 09/20/21 2123   09/20/21 2300  cefTRIAXone (ROCEPHIN) 2 g in sodium chloride 0.9 % 100 mL IVPB        2 g 200 mL/hr over 30 Minutes Intravenous Every 12 hours 09/20/21 2123     09/20/21 2200  acyclovir (ZOVIRAX) 730 mg in dextrose 5 % 250 mL IVPB        10 mg/kg  73 kg (Ideal) 264.6 mL/hr over 60 Minutes Intravenous Every 12 hours 09/20/21 2113     09/20/21 1600  vancomycin (VANCOREADY) IVPB 1500 mg/300 mL  Status:  Discontinued         1,500 mg 150 mL/hr over 120 Minutes Intravenous  Once 09/20/21 1539 09/20/21 1543   09/20/21 1600  vancomycin (VANCOREADY) IVPB 500 mg/100 mL        500 mg 100 mL/hr over 60 Minutes Intravenous  Once 09/20/21 1543 09/20/21 1742   09/20/21 1415  vancomycin (VANCOCIN) IVPB 1000 mg/200 mL premix        1,000 mg 200 mL/hr over 60 Minutes Intravenous  Once 09/20/21 1409 09/20/21 1609   09/20/21 1115  cefTRIAXone (ROCEPHIN) 2 g in sodium chloride 0.9 % 100 mL IVPB        2 g 200 mL/hr over 30 Minutes Intravenous  Once 09/20/21 1110 09/20/21 1216        MEDICATIONS: Scheduled Meds:  ARIPiprazole  5 mg Oral q morning   buPROPion  450 mg Oral q AM   Chlorhexidine Gluconate Cloth  6 each Topical Daily   diphenoxylate-atropine  1 tablet Oral Once   donepezil  10 mg Oral QHS   influenza vaccine adjuvanted  0.5 mL Intramuscular Tomorrow-1000   levothyroxine  75 mcg Oral Q0600   melatonin  4.5 mg Oral QHS   methylPREDNISolone (SOLU-MEDROL) injection  20 mg Intravenous Q24H   pantoprazole  40 mg Oral Daily   sodium chloride flush  3 mL Intravenous Q12H   Continuous Infusions:  sodium chloride 50 mL/hr at 09/22/21 0841   acyclovir 730 mg (09/21/21 2224)   cefTRIAXone (ROCEPHIN)  IV 2 g (09/22/21 0844)   heparin 1,100 Units/hr (09/22/21 0256)   vancomycin 1,000 mg (09/21/21 1731)   PRN Meds:.acetaminophen **OR** acetaminophen, albuterol, diphenoxylate-atropine, ondansetron **OR** ondansetron (ZOFRAN) IV, sodium chloride flush   I have personally reviewed following labs and imaging studies  LABORATORY DATA: CBC: Recent Labs  Lab 09/19/21 1148 09/20/21 1031 09/21/21 0639 09/22/21 0401  WBC 12.8* 15.4* 10.8* 11.3*  NEUTROABS 9.7* 11.0*  --  9.5*  HGB 17.4* 18.0* 15.0 14.4  HCT 50.8 54.3* 44.1 41.5  MCV 85.4 87.4 86.1 85.0  PLT 108* 124* 89* 96*     Basic Metabolic Panel: Recent Labs  Lab 09/19/21 1148 09/20/21 1033 09/21/21 0639 09/22/21 0401  NA 138 138 138 135   K 3.9 4.3 4.3 4.1  CL 107 106 106 108  CO2 25 24 21* 22  GLUCOSE 102* 91 75 111*  BUN 23 26* 26* 21  CREATININE 1.13 1.54* 1.44* 1.22  CALCIUM 8.7* 9.0 7.7* 7.7*     GFR: Estimated Creatinine Clearance: 58.2 mL/min (by C-G formula based on SCr of 1.22 mg/dL).  Liver Function Tests: Recent Labs  Lab 09/19/21 1148 09/20/21 1033 09/21/21 0639 09/22/21 0401  AST 40 51* 35 48*  ALT 84* 107* 71* 73*  ALKPHOS 90 97 71 64  BILITOT 0.8 1.1 0.9 1.0  PROT 5.5* 5.9* 4.5* 4.3*  ALBUMIN 3.4* 3.2* 2.4* 2.2*    No results for input(s): "LIPASE", "AMYLASE" in the last 168 hours. Recent Labs  Lab 09/20/21 1033  AMMONIA 18     Coagulation Profile: Recent Labs  Lab 09/20/21 1031 09/21/21 0639  INR 1.5* 1.6*     Cardiac Enzymes: No results for input(s): "CKTOTAL", "CKMB", "CKMBINDEX", "TROPONINI" in the last 168 hours.  BNP (last 3 results) No results for input(s): "PROBNP" in the last 8760 hours.  Lipid Profile: No results for input(s): "CHOL", "HDL", "LDLCALC", "TRIG", "CHOLHDL", "LDLDIRECT" in the last 72 hours.  Thyroid Function Tests: Recent Labs    09/19/21 1148  TSH 3.186  T4TOTAL 5.4     Anemia Panel: No results for input(s): "VITAMINB12", "FOLATE", "FERRITIN", "TIBC", "IRON", "RETICCTPCT" in the last 72 hours.  Urine analysis:    Component Value Date/Time   COLORURINE YELLOW 09/20/2021 1108   APPEARANCEUR CLEAR 09/20/2021 1108   LABSPEC 1.019 09/20/2021 1108   PHURINE 5.0 09/20/2021 1108   GLUCOSEU NEGATIVE 09/20/2021 1108   HGBUR NEGATIVE 09/20/2021 1108   [place] 09/20/2021 1108   KETONESUR NEGATIVE 09/20/2021 1108   PROTEINUR NEGATIVE 09/20/2021 1108   UROBILINOGEN 0.2 02/17/2015 1528   NITRITE NEGATIVE 09/20/2021 1108   LEUKOCYTESUR NEGATIVE 09/20/2021 1108    Sepsis Labs: Lactic Acid, Venous    Component Value Date/Time   LATICACIDVEN 1.9 09/20/2021 1404    MICROBIOLOGY: Recent Results (from the past 240 hour(s))   Resp Panel by RT-PCR (Flu A&B, Covid) Anterior Nasal Swab     Status: None   Collection Time: 09/20/21 11:08 AM   Specimen: Anterior Nasal Swab  Result Value Ref Range Status   SARS Coronavirus 2 by RT PCR NEGATIVE  NEGATIVE Final    Comment: (NOTE) SARS-CoV-2 target nucleic acids are NOT DETECTED.  The SARS-CoV-2 RNA is generally detectable in upper respiratory specimens during the acute phase of infection. The lowest concentration of SARS-CoV-2 viral copies this assay can detect is 138 copies/mL. A negative result does not preclude SARS-Cov-2 infection and should not be used as the sole basis for treatment or other patient management decisions. A negative result may occur with  improper specimen collection/handling, submission of specimen other than nasopharyngeal swab, presence of viral mutation(s) within the areas targeted by this assay, and inadequate number of viral copies(<138 copies/mL). A negative result must be combined with clinical observations, patient history, and epidemiological information. The expected result is Negative.  Fact Sheet for Patients:  EntrepreneurPulse.com.au  Fact Sheet for Healthcare Providers:  IncredibleEmployment.be  This test is no t yet approved or cleared by the [place] FDA and  has been authorized for detection and/or diagnosis of SARS-CoV-2 by FDA under an Emergency Use Authorization (EUA). This EUA will remain  in effect (meaning this test can be used) for the duration of the COVID-19 declaration under Section 564(b)(1) of the Act, 21 U.S.C.section 360bbb-3(b)(1), unless the authorization is terminated  or revoked sooner.       Influenza A by PCR NEGATIVE NEGATIVE Final   Influenza B by PCR NEGATIVE NEGATIVE Final    Comment: (NOTE) The Xpert Xpress SARS-CoV-2/FLU/RSV plus assay is intended as an aid in the diagnosis of influenza from Nasopharyngeal swab specimens and should not be used as a  sole basis for treatment. Nasal washings and aspirates are unacceptable for Xpert Xpress SARS-CoV-2/FLU/RSV testing.  Fact Sheet for Patients: EntrepreneurPulse.com.au  Fact Sheet for Healthcare Providers: IncredibleEmployment.be  This test is not yet approved or cleared by the [place] FDA and has been authorized for detection and/or diagnosis of SARS-CoV-2 by FDA under an Emergency Use Authorization (EUA). This EUA will remain in effect (meaning this test can be used) for the duration of the COVID-19 declaration under Section 564(b)(1) of the Act, 21 U.S.C. section 360bbb-3(b)(1), unless the authorization is terminated or revoked.  Performed at [facility], [place] [address], [place], [place] [address]   Urine Culture     Status: Abnormal (Preliminary result)   Collection Time: 09/20/21 11:08 AM   Specimen: In/Out Cath Urine  Result Value Ref Range Status   Specimen Description IN/OUT CATH URINE  Final   Special Requests   Final    NONE Performed at [facility], [place] [address], [place], [place] [address]    Culture 10,000 COLONIES/mL GRAM NEGATIVE RODS (A)  Final   Report Status PENDING  Incomplete  Blood Culture (routine x 2)     Status: None (Preliminary result)   Collection Time: 09/20/21  2:04 PM   Specimen: BLOOD LEFT ARM  Result Value Ref Range Status   Specimen Description BLOOD LEFT ARM  Final   Special Requests   Final    BOTTLES DRAWN AEROBIC AND ANAEROBIC Blood Culture adequate volume   Culture   Final    NO GROWTH 2 DAYS Performed at [facility], [place] [address] [place],  [address]    Report Status PENDING  Incomplete  MRSA Next Gen by PCR, Nasal     Status: None   Collection Time: 09/20/21  4:03 PM   Specimen: Nasal Mucosa; Nasal Swab  Result Value Ref Range Status   MRSA by PCR Next Gen NOT DETECTED NOT DETECTED Final  Comment: (NOTE) The GeneXpert MRSA Assay (FDA approved for NASAL  specimens only), is one component of a comprehensive MRSA colonization surveillance program. It is not intended to diagnose MRSA infection nor to guide or monitor treatment for MRSA infections. Test performance is not FDA approved in patients less than 11 years old. Performed at [facility], [place] [address] [place], [place] [address]   C Difficile Quick Screen w PCR reflex     Status: None   Collection Time: 09/22/21  3:40 AM   Specimen: STOOL  Result Value Ref Range Status   C Diff antigen NEGATIVE NEGATIVE Final   C Diff toxin NEGATIVE NEGATIVE Final   C Diff interpretation No C. difficile detected.  Final    Comment: Performed at [facility], [place] [address], [place], [place] [address]    RADIOLOGY STUDIES/RESULTS: Overnight EEG with video  Result Date: 09/22/2021 Lora Havens, MD     09/22/2021  7:18 AM Patient Name: Tyler Mason MRN: [id number] Epilepsy Attending: Lora Havens Referring Physician/Provider: Derek Jack, MD Duration: 09/21/2021 1149 to 09/22/2021  0730 Patient history:  71 year old patient with history of stage IV renal cancer and autoimmune colitis on Remicade presents with sudden onset altered mental status, fever and inability to ambulate. EEG to evaluate for seizure Level of alertness: Awake, asleep AEDs during EEG study: None Technical aspects: This EEG study was done with scalp electrodes positioned according to the 10-20 International system of electrode placement. Electrical activity was reviewed with band pass filter of 1-'70Hz'$ , sensitivity of 7 uV/mm, display speed of 42m/sec with a '60Hz'$  notched filter applied as appropriate. EEG data were recorded continuously and digitally stored.  Video monitoring was available and reviewed as appropriate. Description: The posterior dominant rhythm consists of 7.'5Hz'$  activity of moderate voltage (25-35 uV) seen predominantly in posterior head regions, symmetric and reactive to eye opening and eye closing.  Sleep was characterized by vertex waves, sleep spindles (12 to 14 Hz), maximal frontocentral region. EEG showed continuous generalized 3 to 6 Hz theta-delta slowing. There was right fronto-temporal high amplitude 3-'6Hz'$  theta-delta slowing noted consistent with breach artifact. Hyperventilation and photic stimulation were not performed.   Of note study is technically difficult due to significant electrode artifact. ABNORMALITY - Continuous slow, generalized - Breach artifact, right fronto-temporal IMPRESSION: This technically difficult study is suggestive of cortical dysfunction arising from right fronto-temporal consistent with prior craniotomy. Additionally there is  moderate diffuse encephalopathy, nonspecific etiology. No seizures or epileptiform discharges were seen throughout the recording. Priyanka O Yadav   [place] SITE RITE  Result Date: 09/21/2021 If Site Rite image not attached, placement could not be confirmed due to current cardiac rhythm.  MR BRAIN WO CONTRAST  Result Date: 09/20/2021 CLINICAL DATA:  Altered mental status EXAM: MRI HEAD WITHOUT CONTRAST TECHNIQUE: Multiplanar, multiecho pulse sequences of the brain and surrounding structures were obtained without intravenous contrast. COMPARISON:  02/12/2021 FINDINGS: Examination degraded by motion. Brain: No acute infarct, mass effect or extra-axial collection. No acute or chronic hemorrhage. There is multifocal hyperintense T2-weighted signal within the white matter. Generalized volume loss. The midline structures are normal. Vascular: Major flow voids are preserved. Skull and upper cervical spine: Normal calvarium and skull base. Visualized upper cervical spine and soft tissues are normal. Sinuses/Orbits:No paranasal sinus fluid levels or advanced mucosal thickening. No mastoid or middle ear effusion. Normal orbits. IMPRESSION: 1. Motion degraded examination. 2. No acute intracranial abnormality. 3. Generalized volume loss and  findings of  chronic microvascular ischemia. Electronically Signed   By: Ulyses Jarred M.D.   On: 09/20/2021 22:34   CT ABDOMEN PELVIS W CONTRAST  Result Date: 09/20/2021 CLINICAL DATA:  Abdominal pain. EXAM: CT ABDOMEN AND PELVIS WITH CONTRAST TECHNIQUE: Multidetector CT imaging of the abdomen and pelvis was performed using the standard protocol following bolus administration of intravenous contrast. RADIATION DOSE REDUCTION: This exam was performed according to the departmental dose-optimization program which includes automated exposure control, adjustment of the mA and/or kV according to patient size and/or use of iterative reconstruction technique. CONTRAST:  71m OMNIPAQUE IOHEXOL 350 MG/ML SOLN COMPARISON:  None Available. FINDINGS: Lower chest: Small hiatal hernia. Hepatobiliary: No focal liver abnormality is seen. Cholelithiasis. No gallbladder wall thickening, or biliary dilatation. Pancreas: Unremarkable. No pancreatic ductal dilatation or surrounding inflammatory changes. Spleen: Normal in size without focal abnormality. Adrenals/Urinary Tract: Adrenal glands are unremarkable. There is a 9 mm right lower pole renal stone. Bladder is unremarkable. Stomach/Bowel: Stomach is within normal limits. Appendix appears normal. No evidence of bowel wall thickening, distention, or inflammatory changes. There is a small lipoma in the third portion of the duodenum (series 3, image 50). There is a small bowel small bowel intussusception in the left upper quadrant (series 7, image 47). There is a focal area of soft tissue stranding in the left lower quadrant (series 3, image 57) measuring 2.3 x 1.1 cm, previously 1.6 x 0.9 cm on 08/16/2021 and 2.2 x 0.9 cm on 04/26/2021. There is moderate stool in the rectal vault. Vascular/Lymphatic: No significant vascular findings are present. No enlarged abdominal or pelvic lymph nodes. Reproductive: Prostate is unremarkable. There is a right-sided hydrocele. Other: No abdominal wall hernia  or abnormality. No abdominopelvic ascites. Musculoskeletal: Severe compression deformity at T7 IMPRESSION: 1. No definite cause for diffuse abdominal pain identified. 2. Redemonstrated 9 mm right lower pole nonobstructing renal stone. No evidence of hydronephrosis. 3. Redemonstrated nonspecific soft tissue along the anterior abdominal wall on the left unchanged compared to 04/26/2021; recommend attention on follow-up 4. There is small bowel small bowel intussusception in the left upper quadrant, which is likely transient and unlikely to be the etiology for patient's abdominal pain. 5. Moderate stool in the rectal vault. 6. Small amount of fluid around the right testicle, likely a small hydrocele Electronically Signed   By: HMarin RobertsM.D.   On: 09/20/2021 13:40   DG Chest Port 1 View  Result Date: 09/20/2021 CLINICAL DATA:  Questionable sepsis. EXAM: PORTABLE CHEST 1 VIEW COMPARISON:  09/23/2018 FINDINGS: Lungs are hypoinflated without focal airspace consolidation or effusion. Cardiomediastinal silhouette and remainder of the exam is unchanged. IMPRESSION: Hypoinflation without acute cardiopulmonary disease. Electronically Signed   By: DMarin OlpM.D.   On: 09/20/2021 11:34   CT Head Wo Contrast  Result Date: 09/20/2021 CLINICAL DATA:  Fall 3 days ago. Patient on blood thinners. Altered mental status. Head trauma. EXAM: CT HEAD WITHOUT CONTRAST TECHNIQUE: Contiguous axial images were obtained from the base of the skull through the vertex without intravenous contrast. RADIATION DOSE REDUCTION: This exam was performed according to the departmental dose-optimization program which includes automated exposure control, adjustment of the mA and/or kV according to patient size and/or use of iterative reconstruction technique. COMPARISON:  12/27/2020 FINDINGS: Brain: Ventricles, cisterns and other CSF spaces are unchanged as there is minimal age related atrophic change present. Mild chronic ischemic  microvascular disease is present. No mass, mass effect, shift of midline structures or acute hemorrhage. No evidence of  acute infarction. Mild chronic low attenuation of the right cerebellum likely chronic ischemic change. Vascular: No hyperdense vessel or unexpected calcification. Skull: Prior right frontotemporal craniotomy. Sinuses/Orbits: No acute finding. Other: None. IMPRESSION: 1. No acute findings. 2. Mild chronic ischemic microvascular disease and age related atrophic change. 3. Prior right frontotemporal craniotomy. Electronically Signed   By: Marin Olp M.D.   On: 09/20/2021 10:58     LOS: 2 days   Oren Binet, MD  [facility]    To contact the attending provider between 7A-7P or the covering provider during after hours 7P-7A, please log into the web site [email or web address] and access using universal [place] password for that web site. If you do not have the password, please call the hospital operator.  09/22/2021, 10:09 AM

## 2021-09-22 NOTE — Progress Notes (Signed)
Wife at bedside refused for fungus culture Blood,stated she does not want anymore iv stick on her husband and she will talk to hospitalist  this morning.

## 2021-09-22 NOTE — Progress Notes (Signed)
LTM EEG discontinued - no skin breakdown at unhook.   

## 2021-09-22 NOTE — Social Work (Signed)
   To Whom it May Concern,   [name] DOB: [birth year], is in need of skilled nursing placement at discharge. It is recommended that he recive short term rehabilitation and is expected to require 30 days or less of placement.

## 2021-09-22 NOTE — Progress Notes (Signed)
ANTICOAGULATION CONSULT NOTE - Follow Up Consult  Pharmacy Consult for heparin Indication:  history of DVT/PE  Allergies  Allergen Reactions   Ampicillin Other (See Comments)    Made tongue sore Has patient had a PCN reaction causing immediate rash, facial/tongue/throat swelling, SOB or lightheadedness with hypotension: no Has patient had a PCN reaction causing severe rash involving mucus membranes or skin necrosis: no Has patient had a PCN reaction that required hospitalization no Has patient had a PCN reaction occurring within the last 10 years: no If all of the above answers are "NO", then may proceed with Cephalosporin use.    Macrodantin [Nitrofurantoin Macrocrystal] Itching, Swelling and Rash    Patient Measurements: Height: '5\' 10"'$  (177.8 cm) Weight: 77.9 kg (171 lb 11.8 oz) IBW/kg (Calculated) : 73 Heparin Dosing Weight: 77.9 kg (Actual body weight)  Vital Signs: Temp: 101.1 F (38.4 C) (09/17 0039) Temp Source: Rectal (09/17 0039) BP: 152/138 (09/17 0039) Pulse Rate: 79 (09/17 0039)  Labs: Recent Labs    09/19/21 1148 09/20/21 1031 09/20/21 1033 09/21/21 0639 09/21/21 2102 09/22/21 0024  HGB 17.4* 18.0*  --  15.0  --   --   HCT 50.8 54.3*  --  44.1  --   --   PLT 108* 124*  --  89*  --   --   APTT  --  28  --   --   --   --   LABPROT  --  18.3*  --  18.6*  --   --   INR  --  1.5*  --  1.6*  --   --   HEPARINUNFRC  --   --   --   --  >1.10* >1.10*  CREATININE 1.13  --  1.54* 1.44*  --   --     Estimated Creatinine Clearance: 49.3 mL/min (A) (by C-G formula based on SCr of 1.44 mg/dL (H)).  Medications:  Infusions:   sodium chloride 125 mL/hr at 09/22/21 0027   acyclovir 730 mg (09/21/21 2224)   cefTRIAXone (ROCEPHIN)  IV 2 g (09/21/21 2127)   heparin 1,250 Units/hr (09/21/21 1533)   vancomycin 1,000 mg (09/21/21 1731)   Assessment: 77 yoM with PMH of TBI, history of DVT/PE (on warfarin PTA), positive lupus anticoagulant, hyperparathyroidism s/p  partial parathyroidectomy, depression and GERD who presented with altered mental status. Patient was on warfarin prior to admission and bridging with enoxaparin 80 mg twice daily. Last dose of enoxaparin 9/16 0615. Pharmacy consulted to transition from warfarin/enoxaparin to heparin for VTE treatment. Transitioning to heparin for possible LP in the next few days. After discussion with MD, will not bolus heparin due to LP. Hgb 15 and stable, plt 89 and decreasing.   HL redrawn (drip held for 20 mins, flushed and then level drawn), still >1.1.  Goal of Therapy:  Heparin level 0.3-0.7 units/ml Monitor platelets by anticoagulation protocol: Yes   Plan:  Hold heparin for 1 hour then restart heparin 1100 units/hr . Check 6 hour heparin level  Daily CBC, heparin level. Monitor for signs/symptoms of bleeding.  Alanda Slim, PharmD, [facility] Clinical Pharmacist Please see AMION for all Pharmacists' Contact Phone Numbers 09/22/2021, 1:47 AM

## 2021-09-22 NOTE — Progress Notes (Signed)
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  At baseline, patient is somewhat forgetful and ambulates with a walker but is able to perform his own ADLs.  On Thursday, he abruptly became unable to ambulate and speak in more than single words.  He has recently received a dose of Remicade for his autoimmune colitis.  CT head and MRI brain show no acute abnormalities.  Subjective: Patient's wife reports that he was restless all night and slept for an hour at most.  Overnight EEG revels no seizure activity.  Exam: Vitals:   09/22/21 0400 09/22/21 0800  BP:  114/65  Pulse: 82 67  Resp:  17  Temp: 99.6 F (37.6 C) 99.9 F (37.7 C)  SpO2: 94% 93%   Gen: In bed, NAD Resp: non-labored breathing, no acute distress  NEURO:  Mental Status: Awake, alert, more interactive today, responds to name, nods when asked if he is at home and is able to follow one step commands He is not able to provide a clear and coherent history of present illness. Speech/Language: speech is in single words and short phrases.  Will sometimes repeat phrases spoken to him.  Able to name 3/4 objects today No neglect is noted Cranial Nerves:  II: PERRL III, IV, VI: EOMI.   VII: Face is symmetric resting and smiling.  VIII: Hearing intact to voice IX, X:Phonation normal.  XII Tongue midline Motor: 5/5 strength in all extremities Tone is normal. Bulk is normal.  Sensation:  intact to light touch throughout Gait: Deferred  Pertinent Labs: CBC    Component Value Date/Time   WBC 11.3 (H) 09/22/2021 0401   RBC 4.88 09/22/2021 0401   HGB 14.4 09/22/2021 0401   HGB 17.4 (H) 09/19/2021 1148   HCT 41.5 09/22/2021 0401   PLT 96 (L) 09/22/2021 0401   PLT 108 (L) 09/19/2021 1148    MCV 85.0 09/22/2021 0401   MCH 29.5 09/22/2021 0401   MCHC 34.7 09/22/2021 0401   RDW 16.4 (H) 09/22/2021 0401   LYMPHSABS 1.0 09/22/2021 0401   MONOABS 0.7 09/22/2021 0401   EOSABS 0.0 09/22/2021 0401   BASOSABS 0.0 09/22/2021 0401       Latest Ref Rng & Units 09/22/2021    4:01 AM 09/21/2021    6:39 AM 09/20/2021   10:33 AM  BMP  Glucose 70 - 99 mg/dL 111  75  91   BUN 8 - 23 mg/dL '21  26  26   '$ Creatinine 0.61 - 1.24 mg/dL 1.22  1.44  1.54   Sodium 135 - 145 mmol/L 135  138  138   Potassium 3.5 - 5.1 mmol/L 4.1  4.3  4.3   Chloride 98 - 111 mmol/L 108  106  106   CO2 22 - 32 mmol/L '22  21  24   '$ Calcium 8.9 - 10.3 mg/dL 7.7  7.7  9.0      Imaging Reviewed:  CT-scan of the brain: No acute abnormality   MRI examination of the brain: Motion degraded study, no acute abnormality  Overnight EEG shows no seizure activity  Assessment: 71 year old patient with history of stage IV renal cancer and autoimmune colitis on Remicade presents with sudden onset altered mental status, fever and inability to ambulate.  CT head and MRI brain show no acute  abnormalities.  Overnight EEG shows no seizure activity.  Patient is improved somewhat on exam, able to interact more and can name objects and follow simple commands consistently.  Will obtain LP to rule out infectious cause of AMS given fever and immunosuppression.  Impression: Acute toxic metabolic encephalopathy vs. Meningitis in febrile, immunosuppressed patient  Recommendations: 1) Hold heparin gtt for DVT/PE starting at 0200 tonight for LP tomorrow w/ cell count w/ diff x2 tubes, glucose, protein, gram stain, culture, meningitis/encephalitis PCR panel. Neuro will perform LP and will touch base with ID in AM to make sure we obtain any additional studies they recommend (fungal, AFB etc) 2) Avoid deliriogenic medications when possible, encourage rest 3) Continue empiric coverage with ceftriaxone, vanc, acyclovir. Patient is allergic to  ampicillin. Further guidance on antibiotics per ID  Sedillo , MSN, AGACNP-BC [facility] See Amion for schedule and pager information 09/22/2021 11:28 AM   Attending Neurologist's note:   I personally saw this patient, gathering history, performing a full neurologic examination, reviewing relevant labs, personally reviewing relevant imaging, and formulated the assessment and plan, adding the note above for completeness and clarity to accurately reflect my findings and recommendations.  Su Monks, MD [facility] [phone]  If 7pm- 7am, please page neurology on call as listed in [place].
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
## 2021-09-23 DIAGNOSIS — G9340 Encephalopathy, unspecified: Secondary | ICD-10-CM | POA: Diagnosis not present

## 2021-09-23 DIAGNOSIS — N179 Acute kidney failure, unspecified: Secondary | ICD-10-CM | POA: Diagnosis not present

## 2021-09-23 DIAGNOSIS — F329 Major depressive disorder, single episode, unspecified: Secondary | ICD-10-CM | POA: Diagnosis not present

## 2021-09-23 DIAGNOSIS — R6510 Systemic inflammatory response syndrome (SIRS) of non-infectious origin without acute organ dysfunction: Secondary | ICD-10-CM | POA: Diagnosis not present

## 2021-09-23 DIAGNOSIS — F411 Generalized anxiety disorder: Secondary | ICD-10-CM | POA: Diagnosis not present

## 2021-09-23 LAB — CBC WITH DIFFERENTIAL/PLATELET
Abs Immature Granulocytes: 0.06 10*3/uL (ref 0.00–0.07)
Basophils Absolute: 0 10*3/uL (ref 0.0–0.1)
Basophils Relative: 0 %
Eosinophils Absolute: 0 10*3/uL (ref 0.0–0.5)
Eosinophils Relative: 0 %
HCT: 39.7 % (ref 39.0–52.0)
Hemoglobin: 13.4 g/dL (ref 13.0–17.0)
Immature Granulocytes: 1 %
Lymphocytes Relative: 17 %
Lymphs Abs: 1.7 10*3/uL (ref 0.7–4.0)
MCH: 29.3 pg (ref 26.0–34.0)
MCHC: 33.8 g/dL (ref 30.0–36.0)
MCV: 86.9 fL (ref 80.0–100.0)
Monocytes Absolute: 1.3 10*3/uL — ABNORMAL HIGH (ref 0.1–1.0)
Monocytes Relative: 13 %
Neutro Abs: 7.2 10*3/uL (ref 1.7–7.7)
Neutrophils Relative %: 69 %
Platelets: 112 10*3/uL — ABNORMAL LOW (ref 150–400)
RBC: 4.57 MIL/uL (ref 4.22–5.81)
RDW: 16.4 % — ABNORMAL HIGH (ref 11.5–15.5)
WBC: 10.3 10*3/uL (ref 4.0–10.5)
nRBC: 0 % (ref 0.0–0.2)

## 2021-09-23 LAB — COMPREHENSIVE METABOLIC PANEL
ALT: 69 U/L — ABNORMAL HIGH (ref 0–44)
AST: 42 U/L — ABNORMAL HIGH (ref 15–41)
Albumin: 2.1 g/dL — ABNORMAL LOW (ref 3.5–5.0)
Alkaline Phosphatase: 63 U/L (ref 38–126)
Anion gap: 6 (ref 5–15)
BUN: 17 mg/dL (ref 8–23)
CO2: 23 mmol/L (ref 22–32)
Calcium: 7.7 mg/dL — ABNORMAL LOW (ref 8.9–10.3)
Chloride: 105 mmol/L (ref 98–111)
Creatinine, Ser: 1.13 mg/dL (ref 0.61–1.24)
GFR, Estimated: >60 mL/min (ref >60)
Glucose, Bld: 110 mg/dL — ABNORMAL HIGH (ref 70–99)
Potassium: 3.9 mmol/L (ref 3.5–5.1)
Sodium: 134 mmol/L — ABNORMAL LOW (ref 135–145)
Total Bilirubin: 0.8 mg/dL (ref 0.3–1.2)
Total Protein: 4.3 g/dL — ABNORMAL LOW (ref 6.5–8.1)

## 2021-09-23 LAB — CBC
HCT: 39.2 % (ref 39.0–52.0)
Hemoglobin: 13.8 g/dL (ref 13.0–17.0)
MCH: 29.6 pg (ref 26.0–34.0)
MCHC: 35.2 g/dL (ref 30.0–36.0)
MCV: 84.1 fL (ref 80.0–100.0)
Platelets: 105 10*3/uL — ABNORMAL LOW (ref 150–400)
RBC: 4.66 MIL/uL (ref 4.22–5.81)
RDW: 16.3 % — ABNORMAL HIGH (ref 11.5–15.5)
WBC: 9.3 10*3/uL (ref 4.0–10.5)
nRBC: 0 % (ref 0.0–0.2)

## 2021-09-23 LAB — GLUCOSE, CAPILLARY
Glucose-Capillary: 91 mg/dL (ref 70–99)
Glucose-Capillary: 108 mg/dL — ABNORMAL HIGH (ref 70–99)
Glucose-Capillary: 92 mg/dL (ref 70–99)

## 2021-09-23 LAB — ANA W/REFLEX IF POSITIVE: Anti Nuclear Antibody (ANA): NEGATIVE

## 2021-09-23 LAB — HIV ANTIBODY (ROUTINE TESTING W REFLEX): HIV Screen 4th Generation wRfx: NONREACTIVE

## 2021-09-23 LAB — HEPARIN LEVEL (UNFRACTIONATED): Heparin Unfractionated: 0.33 IU/mL (ref 0.30–0.70)

## 2021-09-23 LAB — APTT: aPTT: 28 seconds (ref 24–36)

## 2021-09-23 NOTE — Procedures (Signed)
Indication: AMS  Risks of the procedure were dicussed with the patient including post-LP headache, bleeding, infection, weakness/numbness of legs(radiculopathy), death.  The patient/patient's proxy agreed and written consent was obtained.   The patient was prepped and draped, and using sterile technique a 20 gauge quinke spinal needle was inserted in the L3-L4 space x 2 attempts. LP was unsuccessful, family updated. Patient will need to have LP done in radiology. Patient tolerated well   Beulah Gandy DNP, ACNPC-AG

## 2021-09-23 NOTE — Progress Notes (Addendum)
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  At baseline, patient is somewhat forgetful and ambulates with a walker but is able to perform his own ADLs.  On Thursday, he abruptly became unable to ambulate and speak in more than single words.  He has recently received a dose of Remicade for his autoimmune colitis.  CT head and MRI brain show no acute abnormalities.  Subjective: Patient's daughter in law is at the bedside. RN at the bedside. Daughter in law states he has had some mild improvement, not as restless as he has been. No new neurological events overnight. Plan for LP at the bedside, consent obtained from the wife, Tyler Mason. Temp max overnight is 100.8. WBC 9.3, Plts 105, NA 134  Exam: Vitals:   09/23/21 0630 09/23/21 0832  BP:  118/64  Pulse:  64  Resp:  18  Temp: 98.3 F (36.8 C) 99.3 F (37.4 C)  SpO2:     Gen: In bed, NAD Resp: non-labored breathing, no acute distress  NEURO:  Mental Status: Drowsy, states his name. Follows very simple one step commands  He is not able to provide a clear and coherent history of present illness. Speech/Language: speech is in single words and short phrases.  Not repeating or naming objects. Quickly drifts back to sleep No neglect is noted Cranial Nerves:  II: PERRL III, IV, VI: EOMI.   VII: Face is symmetric resting and smiling.  VIII: Hearing intact to voice IX, X:Phonation normal.  XII Tongue midline Motor: 5/5 strength in all extremities Tone is normal. Bulk is normal.  Sensation:  intact to light touch throughout Gait: Deferred  Pertinent Labs: CBC    Component Value Date/Time   WBC 9.3 09/23/2021 0332   RBC 4.66 09/23/2021 0332   HGB 13.8 09/23/2021 0332   HGB  17.4 (H) 09/19/2021 1148   HCT 39.2 09/23/2021 0332   PLT 105 (L) 09/23/2021 0332   PLT 108 (L) 09/19/2021 1148   MCV 84.1 09/23/2021 0332   MCH 29.6 09/23/2021 0332   MCHC 35.2 09/23/2021 0332   RDW 16.3 (H) 09/23/2021 0332   LYMPHSABS 1.0 09/22/2021 0401   MONOABS 0.7 09/22/2021 0401   EOSABS 0.0 09/22/2021 0401   BASOSABS 0.0 09/22/2021 0401       Latest Ref Rng & Units 09/23/2021    3:32 AM 09/22/2021    4:01 AM 09/21/2021    6:39 AM  BMP  Glucose 70 - 99 mg/dL 110  111  75   BUN 8 - 23 mg/dL '17  21  26   '$ Creatinine 0.61 - 1.24 mg/dL 1.13  1.22  1.44   Sodium 135 - 145 mmol/L 134  135  138   Potassium 3.5 - 5.1 mmol/L 3.9  4.1  4.3   Chloride 98 - 111 mmol/L 105  108  106   CO2 22 - 32 mmol/L '23  22  21   '$ Calcium 8.9 - 10.3 mg/dL 7.7  7.7  7.7      Imaging Reviewed:  CT-scan of the brain: No acute abnormality   MRI examination of the brain: Motion degraded study, no acute abnormality  Overnight EEG shows no seizure activity  Assessment: 71 year old patient with history of stage IV renal cancer and autoimmune colitis on Remicade presents  with sudden onset altered mental status, fever and inability to ambulate.  CT head and MRI brain show no acute abnormalities.  Overnight EEG shows no seizure activity.   - Patient is improved somewhat on exam, able to interact more and can name objects and follow simple commands consistently.   - Will obtain LP to rule out infectious cause of AMS given fever and immunosuppression.  Impression: Acute toxic metabolic encephalopathy vs. Meningitis in febrile, immunosuppressed patient  Recommendations: 1) Bedside LP to be attempted ttoday w/ cell count w/ diff x2 tubes, glucose, protein, gram stain, culture, meningitis/encephalitis PCR panel, VDRL, cryptococcal antigen and IgG index.  2) Avoid deliriogenic medications when possible, encourage rest 3) Continue empiric coverage with ceftriaxone, vanc, acyclovir. Patient is allergic to  ampicillin. Further guidance on antibiotics per ID  August Albino DNP, ACNPC-AG [facility] See Amion for schedule and pager information 09/23/2021 11:37 AM   Electronically signed: Dr. Kerney Elbe
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
## 2021-09-23 NOTE — Progress Notes (Signed)
Speech Language Pathology Treatment: Dysphagia  Patient Details Name: Tyler Mason MRN: [id number] DOB: 1950/06/23 Today's Date: 09/23/2021 Time: 1020-1030 SLP Time Calculation (min) (ACUTE ONLY): 10 min  Assessment / Plan / Recommendation Clinical Impression  Pt was seen with his daughter-in-law present, who reports very good intake at breakfast and no overt difficulties swallowing. RN reports similar. He is very sleepy at the time of my arrival and he does wake up enough to engage in PO trials, but very quickly falls back asleep as soon as stimulation is paused. No overt s/s of aspiration noted and continue to suspect that his dysphagia is primarily cognitive in nature. Will continue to follow for any improvements in mental status that might allow for diet advancement. For now, will leave on current diet.    HPI HPI: Pt is a 71 yo male presenting 9/15 after 1 week of worsening confusion and weakness, admitted with sepsis of unclear etiology. MRI motion degraded but without acute findings. CXR without acute cardiopulmonary disease. PMH includes: GERD, hearing loss, major neurocognitive disorder, PNA, TBI (1995), recurrent VTE, stage IV kidney ca      SLP Plan  Continue with current plan of care      Recommendations for follow up therapy are one component of a multi-disciplinary discharge planning process, led by the attending physician.  Recommendations may be updated based on patient status, additional functional criteria and insurance authorization.    Recommendations  Diet recommendations: Dysphagia 1 (puree);Thin liquid Liquids provided via: Cup;Straw Medication Administration: Crushed with puree Supervision: Staff to assist with self feeding;Full supervision/cueing for compensatory strategies Compensations: Slow rate;Small sips/bites Postural Changes and/or Swallow Maneuvers: Seated upright 90 degrees;Upright 30-60 min after meal                Oral Care Recommendations:  Oral care BID;Staff/trained caregiver to provide oral care Follow Up Recommendations: Skilled nursing-short term rehab (<3 hours/day) Assistance recommended at discharge: Frequent or constant Supervision/Assistance SLP Visit Diagnosis: Dysphagia, oral phase (R13.11) Plan: Continue with current plan of care           Osie Bond., M.A. [place] Office [phone]  Secure chat preferred   09/23/2021, 10:43 AM

## 2021-09-23 NOTE — Plan of Care (Signed)
  Problem: Clinical Measurements: Goal: Will remain free from infection Outcome: Progressing Goal: Diagnostic test results will improve Outcome: Progressing   

## 2021-09-23 NOTE — Progress Notes (Signed)
Heparin stopped at 0155 as ordered, pending procedure later today. SRP, RN

## 2021-09-23 NOTE — Progress Notes (Signed)
[facility] for Infectious Disease    Date of Admission:  09/20/2021   Total days of antibiotics 4/vanco/ctx/acyclovir           ID: JUNIE ENGRAM is a 71 y.o. 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   Principal Problem:   SIRS (systemic inflammatory response syndrome) (HCC) Active Problems:   Generalized anxiety disorder   Major depressive disorder   Obstructive sleep apnea   Lupus anticoagulant positive   History of traumatic brain injury   Hypothyroid   Kidney cancer, primary, with metastasis from kidney to other site [facility])   Acute encephalopathy   Fall at home, initial encounter   Generalized weakness   Acute kidney injury ([place])   Transaminitis   Subtherapeutic anticoagulation    Subjective: Hard of hearing but still has encephalopathy. He was able to sleep last night per family member and less agitated  Medications:   ARIPiprazole  5 mg Oral q morning   buPROPion  450 mg Oral q AM   Chlorhexidine Gluconate Cloth  6 each Topical Daily   donepezil  10 mg Oral QHS   influenza vaccine adjuvanted  0.5 mL Intramuscular Tomorrow-1000   levothyroxine  75 mcg Oral Q0600   melatonin  4.5 mg Oral QHS   methylPREDNISolone (SOLU-MEDROL) injection  20 mg Intravenous Q24H   pantoprazole  40 mg Oral Daily   QUEtiapine  25 mg Oral QHS   sodium chloride flush  3 mL Intravenous Q12H    Objective: Vital signs in last 24 hours: Temp:  [98.3 F (36.8 C)-100.8 F (38.2 C)] 99.2 F (37.3 C) (09/18 1617) Pulse Rate:  [64-94] 72 (09/18 1617) Resp:  [17-25] 18 (09/18 1617) BP: (105-141)/(48-75) 131/75 (09/18 1617) SpO2:  [91 %-97 %] 93 % (09/18 0540)  Physical Exam  Constitutional: He is oriented to person,only. He appears well-developed and well-nourished. No distress.  HENT: neck no nuchal  rigidity Mouth/Throat: Oropharynx is clear and moist. No oropharyngeal exudate.  Cardiovascular: Normal rate, regular rhythm and normal heart sounds. Exam reveals no gallop and no friction rub.  No murmur heard.  Pulmonary/Chest: Effort normal and breath sounds normal. No respiratory distress. He has no wheezes.  Abdominal: Soft. Bowel sounds are normal. He exhibits no distension. There is no tenderness.  Lymphadenopathy:  He has no cervical adenopathy.  Neurological: not following commands Skin: Skin is warm and dry. No rash noted. No erythema.     Lab Results Recent Labs    09/22/21 0401 09/23/21 0332 09/23/21 1153  WBC 11.3* 9.3 10.3  HGB 14.4 13.8 13.4  HCT 41.5 39.2 39.7  NA 135 134*  --   K 4.1 3.9  --   CL 108 105  --   CO2 22 23  --   BUN 21 17  --   CREATININE 1.22 1.13  --    Liver Panel Recent Labs    09/22/21 0401 09/23/21 0332  PROT 4.3* 4.3*  ALBUMIN 2.2* 2.1*  AST 48* 42*  ALT 73* 69*  ALKPHOS 64 63  BILITOT 1.0 0.8   Sedimentation Rate No results for input(s): "ESRSEDRATE" in the last 72 hours. C-Reactive Protein No results for input(s): "CRP" in the last 72 hours.  Microbiology: reviewed Studies/Results: Overnight EEG with video  Result Date: 09/22/2021 Lora Havens, MD     09/23/2021  10:22 AM Patient Name: LEQUAN DOBRATZ MRN: [id number] Epilepsy Attending: Lora Havens Referring Physician/Provider: Derek Jack, MD Duration: 09/21/2021 1149 to 09/22/2021 1109 Patient history:  71 year old patient with history of stage IV renal cancer and autoimmune colitis on Remicade presents with sudden onset altered mental status, fever and inability to ambulate. EEG to evaluate for seizure Level of alertness: Awake, asleep AEDs during EEG study: None Technical aspects: This EEG study was done with scalp electrodes positioned according to the 10-20 International system of electrode placement. Electrical activity was reviewed with band pass filter of  1-'70Hz'$ , sensitivity of 7 uV/mm, display speed of 67m/sec with a '60Hz'$  notched filter applied as appropriate. EEG data were recorded continuously and digitally stored.  Video monitoring was available and reviewed as appropriate. Description: The posterior dominant rhythm consists of 7.'5Hz'$  activity of moderate voltage (25-35 uV) seen predominantly in posterior head regions, symmetric and reactive to eye opening and eye closing. Sleep was characterized by vertex waves, sleep spindles (12 to 14 Hz), maximal frontocentral region. EEG showed continuous generalized 3 to 6 Hz theta-delta slowing. There was right fronto-temporal high amplitude 3-'6Hz'$  theta-delta slowing noted consistent with breach artifact. Hyperventilation and photic stimulation were not performed.   Of note study is technically difficult due to significant electrode artifact. ABNORMALITY - Continuous slow, generalized - Breach artifact, right fronto-temporal IMPRESSION: This technically difficult study is suggestive of cortical dysfunction arising from right fronto-temporal consistent with prior craniotomy. Additionally there is  moderate diffuse encephalopathy, nonspecific etiology. No seizures or epileptiform discharges were seen throughout the recording. Priyanka OBarbra [name]    Assessment/Plan: Encephalopathy = awaiting for patient to undergo LP so that we can see cell count, and send for encephalitis multiplex PCR.in the meantime, continue on empiric regimen.  Mildly elevated transaminitis = c/w AI colitis; appears stable. On methylpred  [facility] Pager: [phone]  09/23/2021, 5:01 PM

## 2021-09-23 NOTE — Progress Notes (Addendum)
Pt sleeping and wife asked if I would hold meds (Synthroid and Wellbutrin) and they can be given later.

## 2021-09-23 NOTE — Progress Notes (Addendum)
Physical Therapy Treatment Patient Details Name: Tyler Mason MRN: [id number] DOB: April 29, 1950 Today's Date: 09/23/2021   History of Present Illness 71 yo male presenting 9/15 after 1 week of worsening confusion and weakness, admitted with sepsis of unclear etiology. MRI motion degraded but without acute findings. CXR without acute cardiopulmonary disease. Bedside LP 9/18 unsuccessful, plan for reattempt in radiology next date. PMH includes: GERD, hearing loss, major neurocognitive disorder, PNA, TBI (1995), recurrent VTE, stage IV kidney ca.    PT Comments    Pt received in supine, RN clearance for session, pt agreeable to therapy session with encouragement, pt family at bedside and encouraging/supportive. Pt needing up to maxA +2 for supine to long sitting transition x2 attempts from elevated HOB posture, pt with poor tolerance for long sitting with LUE side rail support and maintains <10 sec each attempt. Pt performed supine BUE/BLE A/AAROM therapeutic exercises for strengthening with increased time to process/perform and multimodal cues. After exercises pt reports severe fatigue (also increased after recent LP attempt at bedside) and defers OOB/EOB transfers, pt with dinner in room and pt agreeable to attempt to finish eating. Pt continues to benefit from PT services to progress toward functional mobility goals.    Recommendations for follow up therapy are one component of a multi-disciplinary discharge planning process, led by the attending physician.  Recommendations may be updated based on patient status, additional functional criteria and insurance authorization.  Follow Up Recommendations  Skilled nursing-short term rehab (<3 hours/day) Can patient physically be transported by private vehicle: Yes   Assistance Recommended at Discharge Frequent or constant Supervision/Assistance  Patient can return home with the following A lot of help with walking and/or transfers;A lot of help with  bathing/dressing/bathroom;Assist for transportation;Help with stairs or ramp for entrance   Equipment Recommendations  None recommended by PT    Recommendations for Other Services       Precautions / Restrictions Precautions Precautions: Fall Precaution Comments: rectal tube Restrictions Weight Bearing Restrictions: No     Mobility  Bed Mobility Overal bed mobility: Needs Assistance Bed Mobility: Supine to Sit, Sit to Supine     Supine to sit: +2 for physical assistance, Max assist, HOB elevated Sit to supine: +2 for physical assistance, Max assist, HOB elevated   General bed mobility comments: +2 maxA for partial roll to L/R sides while repostioning/dressing, then long sitting in bed wtih +2 maxA but pt unable to maintain 2/2 fatigue after recent LP attempt and pt defers EOB/OOB, wanting to eat dinner. Bed placed in chair posture for meal and BUE elevated for comfort/edema relief.    Transfers                   General transfer comment: pt defers, wanting to eat dinner, not following commands well for mobility tasks at bed level so unsafe to attempt with only +1 assist        Balance Overall balance assessment: Needs assistance Sitting-balance support: Single extremity supported, Feet unsupported Sitting balance-Leahy Scale: Zero Sitting balance - Comments: maxA for long sitting with L bed rail support, +2 external assist       Standing balance comment: defer due to pt fatigue and only +1 assist at time of session                            Cognition Arousal/Alertness: Awake/alert Behavior During Therapy: Flat affect Overall Cognitive Status: Impaired/Different from baseline Area of Impairment: Attention,  Following commands, Safety/judgement, Problem solving, Awareness                   Current Attention Level: Focused   Following Commands: Follows one step commands inconsistently, Follows one step commands with increased  time Safety/Judgement: Decreased awareness of deficits, Decreased awareness of safety Awareness: Intellectual Problem Solving: Slow processing, Requires verbal cues, Requires tactile cues, Difficulty sequencing, Decreased initiation General Comments: Very flat affect, responds most consistently to yes/no questions. Pauses in response time up to 10 seconds. Per pt's wife, this has been the case since last Thursday. Pt with shaky BUE but this is intermittent (with attempt at self-feeding more UE tremors but decreased fasciculations vs tremors with BUE AAROM/exercises).        Exercises Other Exercises Other Exercises: supine BUE AAROM: gross grasp, wrist flex/ext, elbow flex/ext, shoulder flexion x5-10 reps ea Other Exercises: BUE A/AAROM chest press with light resistance as tolerated x10 reps ea Other Exercises: supine BLE A/AAROM: ankle pumps (AROM after initial AA), SAQ, hip flexion (in chair posture), SLR (AA x 5 reps), hip abduction (AA) x10 reps ea    General Comments General comments (skin integrity, edema, etc.): BP 136/77 (95); SpO2 90-93% on RA      Pertinent Vitals/Pain Pain Assessment Pain Assessment: Faces Faces Pain Scale: Hurts a little bit Pain Location: generalized Pain Descriptors / Indicators: Discomfort, Guarding Pain Intervention(s): Limited activity within patient's tolerance, Monitored during session, Repositioned (heels floated, elevated BUE with pillows/blankets for edema relief)     PT Goals (current goals can now be found in the care plan section) Acute Rehab PT Goals Patient Stated Goal: home if possible PT Goal Formulation: With patient Time For Goal Achievement: 10/05/21 Progress towards PT goals: Progressing toward goals    Frequency    Min 3X/week      PT Plan Current plan remains appropriate       AM-PAC PT "6 Clicks" Mobility   Outcome Measure  Help needed turning from your back to your side while in a flat bed without using bedrails?:  A Lot Help needed moving from lying on your back to sitting on the side of a flat bed without using bedrails?: Total Help needed moving to and from a bed to a chair (including a wheelchair)?: Total Help needed standing up from a chair using your arms (e.g., wheelchair or bedside chair)?: Total Help needed to walk in hospital room?: Total Help needed climbing 3-5 steps with a railing? : Total 6 Click Score: 7    End of Session   Activity Tolerance: Patient limited by lethargy Patient left: in bed;with call bell/phone within reach;with bed alarm set;with family/visitor present;with nursing/sitter in room;Other (comment) (BUE elevated for edema relief) Nurse Communication: Mobility status PT Visit Diagnosis: Other abnormalities of gait and mobility (R26.89);Muscle weakness (generalized) (M62.81)     Time: 6144-3154 PT Time Calculation (min) (ACUTE ONLY): 28 min  Charges:  $Therapeutic Exercise: 8-22 mins $Therapeutic Activity: 8-22 mins                     Meldon Hanzlik P., PTA Acute Rehabilitation Services Secure Chat Preferred 9a-5:30pm Office: [place] 09/23/2021, 6:20 PM

## 2021-09-24 ENCOUNTER — Inpatient Hospital Stay (HOSPITAL_COMMUNITY): Payer: PPO

## 2021-09-24 DIAGNOSIS — F329 Major depressive disorder, single episode, unspecified: Secondary | ICD-10-CM | POA: Diagnosis not present

## 2021-09-24 DIAGNOSIS — N179 Acute kidney failure, unspecified: Secondary | ICD-10-CM | POA: Diagnosis not present

## 2021-09-24 DIAGNOSIS — R6510 Systemic inflammatory response syndrome (SIRS) of non-infectious origin without acute organ dysfunction: Secondary | ICD-10-CM | POA: Diagnosis not present

## 2021-09-24 DIAGNOSIS — F411 Generalized anxiety disorder: Secondary | ICD-10-CM | POA: Diagnosis not present

## 2021-09-24 DIAGNOSIS — G9340 Encephalopathy, unspecified: Secondary | ICD-10-CM | POA: Diagnosis not present

## 2021-09-24 LAB — BASIC METABOLIC PANEL
Anion gap: 5 (ref 5–15)
BUN: 18 mg/dL (ref 8–23)
CO2: 22 mmol/L (ref 22–32)
Calcium: 8 mg/dL — ABNORMAL LOW (ref 8.9–10.3)
Chloride: 108 mmol/L (ref 98–111)
Creatinine, Ser: 1.15 mg/dL (ref 0.61–1.24)
GFR, Estimated: >60 mL/min (ref >60)
Glucose, Bld: 131 mg/dL — ABNORMAL HIGH (ref 70–99)
Potassium: 3.6 mmol/L (ref 3.5–5.1)
Sodium: 135 mmol/L (ref 135–145)

## 2021-09-24 LAB — PROTEIN, CSF: Total  Protein, CSF: 76 mg/dL — ABNORMAL HIGH (ref 15–45)

## 2021-09-24 LAB — MENINGITIS/ENCEPHALITIS PANEL (CSF)

## 2021-09-24 LAB — CSF CELL COUNT WITH DIFFERENTIAL
Eosinophils, CSF: 0 % (ref 0–1)
Eosinophils, CSF: 0 % (ref 0–1)
Lymphs, CSF: 27 % — ABNORMAL LOW (ref 40–80)
Lymphs, CSF: 41 % (ref 40–80)
Monocyte-Macrophage-Spinal Fluid: 49 % — ABNORMAL HIGH (ref 15–45)
Monocyte-Macrophage-Spinal Fluid: 61 % — ABNORMAL HIGH (ref 15–45)
RBC Count, CSF: 0 /mm3
RBC Count, CSF: 3 /mm3 — ABNORMAL HIGH
Segmented Neutrophils-CSF: 10 % — ABNORMAL HIGH (ref 0–6)
Segmented Neutrophils-CSF: 12 % — ABNORMAL HIGH (ref 0–6)
Tube #: 1
Tube #: 4
WBC, CSF: 13 /mm3 (ref 0–5)
WBC, CSF: 14 /mm3 (ref 0–5)

## 2021-09-24 LAB — CBC
HCT: 40.4 % (ref 39.0–52.0)
Hemoglobin: 14.1 g/dL (ref 13.0–17.0)
MCH: 29.6 pg (ref 26.0–34.0)
MCHC: 34.9 g/dL (ref 30.0–36.0)
MCV: 84.7 fL (ref 80.0–100.0)
Platelets: 94 10*3/uL — ABNORMAL LOW (ref 150–400)
RBC: 4.77 MIL/uL (ref 4.22–5.81)
RDW: 16.4 % — ABNORMAL HIGH (ref 11.5–15.5)
WBC: 9.3 10*3/uL (ref 4.0–10.5)
nRBC: 0 % (ref 0.0–0.2)

## 2021-09-24 LAB — HEPARIN LEVEL (UNFRACTIONATED): Heparin Unfractionated: <0.1 IU/mL — ABNORMAL LOW (ref 0.30–0.70)

## 2021-09-24 LAB — GLUCOSE, CAPILLARY
Glucose-Capillary: 105 mg/dL — ABNORMAL HIGH (ref 70–99)
Glucose-Capillary: 112 mg/dL — ABNORMAL HIGH (ref 70–99)

## 2021-09-24 LAB — CRYPTOCOCCAL ANTIGEN, CSF: Crypto Ag: NEGATIVE

## 2021-09-24 LAB — GLUCOSE, CSF: Glucose, CSF: 57 mg/dL (ref 40–70)

## 2021-09-24 MED ORDER — HEPARIN (PORCINE) 25000 UT/250ML-% IV SOLN
950.0000 [IU]/h | INTRAVENOUS | Status: DC
  Administered 2021-09-24: 950 [IU]/h via INTRAVENOUS

## 2021-09-24 MED ORDER — LIDOCAINE HCL (PF) 1 % IJ SOLN
5.0000 mL | Freq: Once | INTRAMUSCULAR | Status: AC
  Administered 2021-09-24: 2 mL
  Filled 2021-09-24: qty 5

## 2021-09-24 MED ORDER — VANCOMYCIN HCL IN DEXTROSE 1-5 GM/200ML-% IV SOLN
1000.0000 mg | Freq: Two times a day (BID) | INTRAVENOUS | Status: DC
  Administered 2021-09-24: 1000 mg via INTRAVENOUS
  Filled 2021-09-24 (×2): qty 200

## 2021-09-24 NOTE — Progress Notes (Signed)
Patient refuses CPAP therapy at this time. Patient has his machine from home in the room to use if he changes his mind. Patient was also advised to contact RT with any concerns.

## 2021-09-24 NOTE — Progress Notes (Signed)
Physical Therapy Treatment Patient Details Name: Tyler Mason MRN: [id number] DOB: 02-14-1950 Today's Date: 09/24/2021   History of Present Illness 71 yo male presenting 9/15 after 1 week of worsening confusion and weakness, admitted with sepsis of unclear etiology. MRI motion degraded but without acute findings. CXR without acute cardiopulmonary disease. Bedside LP 9/18 unsuccessful, plan for reattempt in radiology next date. PMH includes: GERD, hearing loss, major neurocognitive disorder, PNA, TBI (1995), recurrent VTE, stage IV kidney ca.    PT Comments    Pt received in supine, agreeable to therapy session and following simple commands with increased time. Pt making good progress toward goals, able to tolerate transfer training and pre-gait weight shifting briefly in [place] and transferred from bed>chair via [place] with up to [place] for sit<>stand. Pt needing increased time and up to +2 maxA for bed mobility but with good effort for trunk raising and anterior scooting to foot flat with dense cues. R sided lean seated and standing. Disposition updated below per discussion with pt, pt's spouse and supervising PT Alexa S. Pt continues to benefit from PT services to progress toward functional mobility goals.    Recommendations for follow up therapy are one component of a multi-disciplinary discharge planning process, led by the attending physician.  Recommendations may be updated based on patient status, additional functional criteria and insurance authorization.  Follow Up Recommendations  Acute inpatient rehab (3hours/day) Can patient physically be transported by private vehicle: Yes (currently would need wheelchair accessible van)   Assistance Recommended at Discharge Frequent or constant Supervision/Assistance  Patient can return home with the following A lot of help with walking and/or transfers;A lot of help with bathing/dressing/bathroom;Assist for transportation;Help with stairs or ramp  for entrance   Equipment Recommendations  None recommended by PT    Recommendations for Other Services       Precautions / Restrictions Precautions Precautions: Fall Precaution Comments: rectal tube Restrictions Weight Bearing Restrictions: No     Mobility  Bed Mobility Overal bed mobility: Needs Assistance Bed Mobility: Rolling, Sidelying to Sit Rolling: Max assist Sidelying to sit: Max assist, +2 for physical assistance   Sit to supine: +2 for physical assistance, Max assist, HOB elevated   General bed mobility comments: +2 maxA for supine to EOB via rails and cues for log roll to R side with pt assisting to raise trunk with BUE. Assist to guide BLE toward EOB with tactile cues and pt assisting somewhat.    Transfers Overall transfer level: Needs assistance Equipment used: Rolling walker (2 wheels) Transfers: Sit to/from Stand, Bed to chair/wheelchair/BSC Sit to Stand: Mod assist, +2 physical assistance      General transfer comment: cues for body mechanics, pt stand from EOB and Stedy seat with +2 modA and increased time to perform. Able to weight shift 3-4 times each LE standing in Stedy frame, then fatigues Transfer via Lift Equipment: Stedy  Ambulation/Gait    Pre-gait activities: standing weight shift in Stedy frame x 3-4 reps each LE, small amount of foot clearance       Balance Overall balance assessment: Needs assistance Sitting-balance support: Single extremity supported, Feet supported Sitting balance-Leahy Scale: Poor Sitting balance - Comments: needing min A support, leans to R Postural control: Right lateral lean Standing balance support: Bilateral upper extremity supported, During functional activity Standing balance-Leahy Scale: Poor Standing balance comment: reliant on external support, pt with R lean           Cognition Arousal/Alertness: Awake/alert Behavior During Therapy:  Flat affect Overall Cognitive Status: Impaired/Different from  baseline Area of Impairment: Attention, Following commands, Safety/judgement, Problem solving, Awareness                   Current Attention Level: Focused   Following Commands: Follows one step commands inconsistently, Follows one step commands with increased time Safety/Judgement: Decreased awareness of deficits, Decreased awareness of safety Awareness: Intellectual Problem Solving: Slow processing, Requires verbal cues, Requires tactile cues, Difficulty sequencing, Decreased initiation General Comments: Pt responds most consistently to yes/no questions. Pauses in response time up to 10 seconds. Following simple commands for mobility with increased time        Exercises Other Exercises Other Exercises: seated BLE A/AAROM: SAQ, hip flexion x10 reps ea Other Exercises: standing weight shifting x 4 reps ea LE in [place] Other Exercises: STS x 2 trials and static standing up to 2 mins at a time    General Comments General comments (skin integrity, edema, etc.): VSS on RA      Pertinent Vitals/Pain Pain Assessment Pain Assessment: Faces Faces Pain Scale: Hurts a little bit Pain Location: intermittent grimacing, not localized Pain Descriptors / Indicators: Guarding, Grimacing Pain Intervention(s): Monitored during session, Repositioned           PT Goals (current goals can now be found in the care plan section) Acute Rehab PT Goals Patient Stated Goal: home if possible PT Goal Formulation: With patient Time For Goal Achievement: 10/05/21 Progress towards PT goals: Progressing toward goals    Frequency    Min 3X/week      PT Plan Current plan remains appropriate    Co-evaluation PT/OT/SLP Co-Evaluation/Treatment: Yes Reason for Co-Treatment: Necessary to address cognition/behavior during functional activity;Complexity of the patient's impairments (multi-system involvement);For patient/therapist safety;To address functional/ADL transfers PT goals addressed  during session: Mobility/safety with mobility;Balance;Proper use of DME;Strengthening/ROM OT goals addressed during session: ADL's and self-care      AM-PAC PT "6 Clicks" Mobility   Outcome Measure  Help needed turning from your back to your side while in a flat bed without using bedrails?: A Lot Help needed moving from lying on your back to sitting on the side of a flat bed without using bedrails?: Total Help needed moving to and from a bed to a chair (including a wheelchair)?: Total Help needed standing up from a chair using your arms (e.g., wheelchair or bedside chair)?: A Lot Help needed to walk in hospital room?: Total Help needed climbing 3-5 steps with a railing? : Total 6 Click Score: 8    End of Session Equipment Utilized During Treatment: Gait belt Activity Tolerance: Patient tolerated treatment well Patient left: with call bell/phone within reach;with family/visitor present;Other (comment);in chair (spouse present) Nurse Communication: Mobility status PT Visit Diagnosis: Other abnormalities of gait and mobility (R26.89);Muscle weakness (generalized) (M62.81)     Time: 1610-9604 PT Time Calculation (min) (ACUTE ONLY): 38 min  Charges:  $Therapeutic Exercise: 8-22 mins                     Kenyanna Grzesiak P., PTA Acute Rehabilitation Services Secure Chat Preferred 9a-5:30pm Office: [place] 09/24/2021, 6:29 PM

## 2021-09-24 NOTE — Progress Notes (Signed)
[place] for Infectious Disease    Date of Admission:  09/20/2021   Total days of antibiotics 5          ID: Tyler Mason is a 71 y.o. male with   Principal Problem:   SIRS (systemic inflammatory response syndrome) (HCC) Active Problems:   Generalized anxiety disorder   Major depressive disorder   Obstructive sleep apnea   Lupus anticoagulant positive   History of traumatic brain injury   Hypothyroid   Kidney cancer, primary, with metastasis from kidney to other site [facility])   Acute encephalopathy   Fall at home, initial encounter   Generalized weakness   Acute kidney injury ([place])   Transaminitis   Subtherapeutic anticoagulation    Subjective: Afebrile. His wife reports he had better mentation this morning but recently returned from getting LP where he received sedation. Sleepy at the moment.  LP cell count showed: wbc of 14 with 12% seg, and 27% lymph. Protein 76 and glu 57. Meningitis PCR panel negative  Medications:   ARIPiprazole  5 mg Oral q morning   buPROPion  450 mg Oral q AM   Chlorhexidine Gluconate Cloth  6 each Topical Daily   donepezil  10 mg Oral QHS   influenza vaccine adjuvanted  0.5 mL Intramuscular Tomorrow-1000   levothyroxine  75 mcg Oral Q0600   melatonin  4.5 mg Oral QHS   methylPREDNISolone (SOLU-MEDROL) injection  20 mg Intravenous Q24H   pantoprazole  40 mg Oral Daily   QUEtiapine  25 mg Oral QHS   sodium chloride flush  3 mL Intravenous Q12H    Objective: Vital signs in last 24 hours: Temp:  [97.4 F (36.3 C)-99.8 F (37.7 C)] 97.6 F (36.4 C) (09/19 0800) Pulse Rate:  [86] 86 (09/18 1943) BP: (135)/(68) 135/68 (09/18 1943) SpO2:  [94 %-97 %] 97 % (09/19 0800)  Physical Exam  Constitutional: sleeping.He appears well-developed and well-nourished. No distress.  HENT:  Mouth/Throat: Oropharynx is clear and moist. No oropharyngeal exudate.  Skin: Skin is warm and dry. No rash noted. No erythema.   Lab Results Recent Labs     09/23/21 0332 09/23/21 1153 09/24/21 0253  WBC 9.3 10.3 9.3  HGB 13.8 13.4 14.1  HCT 39.2 39.7 40.4  NA 134*  --  135  K 3.9  --  3.6  CL 105  --  108  CO2 23  --  22  BUN 17  --  18  CREATININE 1.13  --  1.15   Liver Panel Recent Labs    09/22/21 0401 09/23/21 0332  PROT 4.3* 4.3*  ALBUMIN 2.2* 2.1*  AST 48* 42*  ALT 73* 69*  ALKPHOS 64 63  BILITOT 1.0 0.8   Sedimentation Rate No results for input(s): "ESRSEDRATE" in the last 72 hours. C-Reactive Protein No results for input(s): "CRP" in the last 72 hours.  Microbiology: reviewed Studies/Results: DG FL GUIDED LUMBAR PUNCTURE  Result Date: 09/24/2021 CLINICAL DATA:  Acute encephalopathy. EXAM: DIAGNOSTIC LUMBAR PUNCTURE UNDER FLUOROSCOPIC GUIDANCE COMPARISON:  Brain MRI 09/20/2021 FLUOROSCOPY: Radiation Exposure Index (as provided by the fluoroscopic device): 19.5 mGy PROCEDURE: Informed consent was obtained from the patient's wife prior to the procedure, including potential complications of headache, allergy, and pain. Appropriate preprocedural time-out was performed to verify the patient's identity. An appropriate site for lumbar puncture was determined under fluoroscopic observation at the L3-4 level. With the patient prone, the lower back was prepped with Betadine. 1% Lidocaine was used for local  anesthesia. Lumbar puncture was performed at the L3-4 level using a 20 gauge needle with return of clear CSF with an opening pressure of 14 cm water. 8.0 ml of CSF were obtained for appropriate laboratory studies. The patient tolerated the procedure well and there were no apparent complications. IMPRESSION: Fluoroscopic guided lumbar puncture. Electronically Signed   By: Marijo Sanes M.D.   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 [employer] though he has been pretreated -is NEGATIVE still has good sensitivity. Will discontinue  abtx and watch off of abtx. Will review if any if remicaid can cause similar presentation.  [facility] for Infectious Diseases Pager: [phone]  09/24/2021, 4:52 PM

## 2021-09-24 NOTE — Progress Notes (Signed)
Occupational Therapy Treatment Patient Details Name: Tyler Mason MRN: [id number] DOB: 19-Oct-1950 Today's Date: 09/24/2021   History of present illness 71 yo male presenting 9/15 after 1 week of worsening confusion and weakness, admitted with sepsis of unclear etiology. MRI motion degraded but without acute findings. CXR without acute cardiopulmonary disease. Bedside LP 9/18 unsuccessful, plan for reattempt in radiology next date. PMH includes: GERD, hearing loss, major neurocognitive disorder, PNA, TBI (1995), recurrent VTE, stage IV kidney ca.   OT comments  Pt progressing towards goals, needing mod A for seated ADLs, max A +2 for bed mobility, and mod A +2 for transfers via [place]. Pt with fair balance, with R lateral lean in standing, and when seated EOB. Pt with improved activity tolerance, able to stand x2 and weight shift in [place]. Pt presenting with impairments listed below, will follow acutely. Updating d/c recommendation to AIR at d/c.   Recommendations for follow up therapy are one component of a multi-disciplinary discharge planning process, led by the attending physician.  Recommendations may be updated based on patient status, additional functional criteria and insurance authorization.    Follow Up Recommendations  Acute inpatient rehab (3hours/day)    Assistance Recommended at Discharge Frequent or constant Supervision/Assistance  Patient can return home with the following  Two people to help with walking and/or transfers;A lot of help with bathing/dressing/bathroom;Assistance with cooking/housework;Assistance with feeding;Direct supervision/assist for medications management;Direct supervision/assist for financial management;Assist for transportation;Help with stairs or ramp for entrance   Equipment Recommendations  Other (comment) (defer)    Recommendations for Other Services PT consult;Rehab consult    Precautions / Restrictions Precautions Precautions: Fall Precaution  Comments: rectal tube Restrictions Weight Bearing Restrictions: No       Mobility Bed Mobility Overal bed mobility: Needs Assistance Bed Mobility: Supine to Sit, Sit to Supine     Supine to sit: +2 for physical assistance, Max assist, HOB elevated Sit to supine: +2 for physical assistance, Max assist, HOB elevated   General bed mobility comments: +2 maxA for partial roll to L/R sides while repostioning/dressing, then long sitting in bed wtih +2 maxA but pt unable to maintain 2/2 fatigue after recent LP attempt and pt defers EOB/OOB, wanting to eat dinner. Bed placed in chair posture for meal and BUE elevated for comfort/edema relief.    Transfers Overall transfer level: Needs assistance Equipment used: Ambulation equipment used Transfers: Sit to/from Stand, Bed to chair/wheelchair/BSC Sit to Stand: Mod assist, +2 physical assistance             Transfer via Lift Equipment: Stedy   Balance Overall balance assessment: Needs assistance Sitting-balance support: Single extremity supported, Feet unsupported Sitting balance-Leahy Scale: Poor Sitting balance - Comments: needing min A support, leans to R Postural control: Posterior lean, Right lateral lean Standing balance support: Bilateral upper extremity supported, During functional activity Standing balance-Leahy Scale: Poor Standing balance comment: reliant on external support                           ADL either performed or assessed with clinical judgement   ADL Overall ADL's : Needs assistance/impaired     Grooming: Moderate assistance;Sitting Grooming Details (indicate cue type and reason): tactile cues needed for bringing hand to face                 Toilet Transfer: Moderate assistance;+2 for physical assistance Toilet Transfer Details (indicate cue type and reason): with use of stedy  Functional mobility during ADLs: Moderate assistance;+2 for physical assistance       Extremity/Trunk Assessment Upper Extremity Assessment Upper Extremity Assessment: Generalized weakness   Lower Extremity Assessment Lower Extremity Assessment: Defer to PT evaluation        Vision   Additional Comments: disconjugate gaze, will further assess, 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   Perception Perception Perception: Not tested   Praxis Praxis Praxis: Not tested    Cognition Arousal/Alertness: Awake/alert Behavior During Therapy: Flat affect Overall Cognitive Status: Impaired/Different from baseline Area of Impairment: Attention, Following commands, Safety/judgement, Problem solving, Awareness                   Current Attention Level: Focused   Following Commands: Follows one step commands inconsistently, Follows one step commands with increased time Safety/Judgement: Decreased awareness of deficits, Decreased awareness of safety Awareness: Intellectual Problem Solving: Slow processing, Requires verbal cues, Requires tactile cues, Difficulty sequencing, Decreased initiation General Comments: Very flat affect, responds most consistently to yes/no questions. Pauses in response time up to 10 seconds.        Exercises      Shoulder Instructions       General Comments VSS on RA    Pertinent Vitals/ Pain       Pain Assessment Pain Assessment: Faces Pain Score: 2  Faces Pain Scale: Hurts a little bit Pain Location: intermittent grimacing, not localized Pain Descriptors / Indicators: Guarding, Grimacing Pain Intervention(s): Limited activity within patient's tolerance, Monitored during session, Repositioned  Home Living                                          Prior Functioning/Environment              Frequency  Min 2X/week        Progress Toward Goals  OT Goals(current goals can now be found in the care plan section)  Progress towards OT goals: Progressing toward goals  Acute Rehab OT Goals Patient  Stated Goal: none stated OT Goal Formulation: With patient Time For Goal Achievement: 10/05/21 Potential to Achieve Goals: Fair ADL Goals Pt Will Perform Grooming: with modified independence;sitting Pt Will Transfer to Toilet: with min assist;with +2 assist;squat pivot transfer;stand pivot transfer;bedside commode Additional ADL Goal #1: pt will perform bed mobility with mod A in prep for ADLs  Plan Frequency remains appropriate;Discharge plan needs to be updated    Co-evaluation    PT/OT/SLP Co-Evaluation/Treatment: Yes Reason for Co-Treatment: Necessary to address cognition/behavior during functional activity;Complexity of the patient's impairments (multi-system involvement);For patient/therapist safety;To address functional/ADL transfers   OT goals addressed during session: ADL's and self-care      AM-PAC OT "6 Clicks" Daily Activity     Outcome Measure   Help from another person eating meals?: A Little Help from another person taking care of personal grooming?: A Lot Help from another person toileting, which includes using toliet, bedpan, or urinal?: Total Help from another person bathing (including washing, rinsing, drying)?: A Lot Help from another person to put on and taking off regular upper body clothing?: A Lot Help from another person to put on and taking off regular lower body clothing?: Total 6 Click Score: 11    End of Session Equipment Utilized During Treatment: Other (comment) (stedy)  OT Visit Diagnosis: Unsteadiness on feet (R26.81);Other abnormalities of gait and mobility (R26.89);Muscle  weakness (generalized) (M62.81);Other symptoms and signs involving cognitive function   Activity Tolerance Patient tolerated treatment well   Patient Left in chair;with call bell/phone within reach;with family/visitor present   Nurse Communication Mobility status        Time: 2956-2130 OT Time Calculation (min): 38 min  Charges: OT General Charges $OT Visit: 1  Visit OT Treatments $Self Care/Home Management : 23-37 mins  Lynnda Child, OTD, OTR/L Acute Rehab [phone]) [phone]   Kaylyn Lim 09/24/2021, 5:16 PM
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
## 2021-09-24 NOTE — Progress Notes (Addendum)
Pharmacy Antibiotic Note  Tyler Mason is a 71 y.o. male admitted on 09/20/2021 with sepsis. Patient reportedly had a fever up to 100.4 prior to arrival, tachycardic,and had tachypnea. Scr 1.15 at baseline. WBC 9.3 wnl, afebrile. Pharmacy has been consulted for Vancomycin dosing.  Plan: Adjust vancomycin to 1000 mg IV q12h using nomogram (goal trough 15-20), for meningitis r/o Continue acyclovir 730 mg (10 mg/kg using IBW) IV q12h Continue ceftriaxone 2g IV q12h per MD  Monitor renal function CBC, s/sx infection    Height: '5\' 10"'$  (177.8 cm) Weight: 77.9 kg (171 lb 11.8 oz) IBW/kg (Calculated) : 73  Temp (24hrs), Avg:99 F (37.2 C), Min:97.4 F (36.3 C), Max:99.8 F (37.7 C)  Recent Labs  Lab 09/20/21 1033 09/20/21 1404 09/21/21 0639 09/22/21 0401 09/23/21 0332 09/23/21 1153 09/24/21 0253  WBC  --   --  10.8* 11.3* 9.3 10.3 9.3  CREATININE 1.54*  --  1.44* 1.22 1.13  --  1.15  LATICACIDVEN  --  1.9  --   --   --   --   --      Estimated Creatinine Clearance: 61.7 mL/min (by C-G formula based on SCr of 1.15 mg/dL).    Allergies  Allergen Reactions   Ampicillin Other (See Comments)    Made tongue sore Has patient had a PCN reaction causing immediate rash, facial/tongue/throat swelling, SOB or lightheadedness with hypotension: no Has patient had a PCN reaction causing severe rash involving mucus membranes or skin necrosis: no Has patient had a PCN reaction that required hospitalization no Has patient had a PCN reaction occurring within the last 10 years: no If all of the above answers are "NO", then may proceed with Cephalosporin use.    Macrodantin [Nitrofurantoin Macrocrystal] Itching, Swelling and Rash   Dosage adjustments: 9/19 - vancomycin 1000 mg IV q24h > q12h   Antimicrobials this admission: 9/15 Ceftriaxone >>  9/15 Vancomycin >>   Microbiology results: 9/17 fungus culture:  9/15 BCx: ngtd 9/15 UCx: citrobacter freundii, klebsiella pneumoniae   Thank  you for allowing pharmacy to be a part of this patient's care.  Eliseo Gum, PharmD PGY1 Pharmacy Resident   09/24/2021  8:17 AM

## 2021-09-24 NOTE — Progress Notes (Signed)
ANTICOAGULATION CONSULT NOTE - Follow Up Consult  Pharmacy Consult for heparin Indication:  history of DVT/PE  Allergies  Allergen Reactions   Ampicillin Other (See Comments)    Made tongue sore Has patient had a PCN reaction causing immediate rash, facial/tongue/throat swelling, SOB or lightheadedness with hypotension: no Has patient had a PCN reaction causing severe rash involving mucus membranes or skin necrosis: no Has patient had a PCN reaction that required hospitalization no Has patient had a PCN reaction occurring within the last 10 years: no If all of the above answers are "NO", then may proceed with Cephalosporin use.    Macrodantin [Nitrofurantoin Macrocrystal] Itching, Swelling and Rash    Patient Measurements: Height: '5\' 10"'$  (177.8 cm) Weight: 77.9 kg (171 lb 11.8 oz) IBW/kg (Calculated) : 73 Heparin Dosing Weight: 77.9 kg (Actual body weight)  Vital Signs: Temp: 97.6 F (36.4 C) (09/19 0800) Temp Source: Axillary (09/19 0800)  Labs: Recent Labs    09/22/21 0401 09/22/21 1117 09/23/21 0332 09/23/21 1153 09/24/21 0253  HGB 14.4  --  13.8 13.4 14.1  HCT 41.5  --  39.2 39.7 40.4  PLT 96*  --  105* 112* 94*  APTT  --   --   --  28  --   HEPARINUNFRC  --  0.90* 0.33  --  <0.10*  CREATININE 1.22  --  1.13  --  1.15    Estimated Creatinine Clearance: 61.7 mL/min (by C-G formula based on SCr of 1.15 mg/dL).  Medications:  Infusions:   sodium chloride 50 mL/hr at 09/22/21 0841   acyclovir 730 mg (09/23/21 2228)   cefTRIAXone (ROCEPHIN)  IV 2 g (09/23/21 2221)   heparin     vancomycin 1,000 mg (09/24/21 1105)   Assessment: 69 yoM with PMH of TBI, history of DVT/PE (on warfarin PTA), positive lupus anticoagulant, hyperparathyroidism s/p partial parathyroidectomy, depression and GERD who presented with altered mental status. Patient was on warfarin prior to admission and bridging with enoxaparin 80 mg twice daily. Last dose of enoxaparin 9/16 0615. Pharmacy  consulted to transition from warfarin/enoxaparin to heparin for VTE treatment.  S/p LP - to resume heparin at 6 pm  Goal of Therapy:  Heparin level 0.3-0.7 units/ml Monitor platelets by anticoagulation protocol: Yes   Plan:  Heparin at 950 units / hr  Daily heparin level, CBC  Thank you. Anette Guarneri, PharmD 09/24/2021, 11:13 AM

## 2021-09-25 ENCOUNTER — Inpatient Hospital Stay (HOSPITAL_COMMUNITY): Payer: PPO

## 2021-09-25 DIAGNOSIS — G9340 Encephalopathy, unspecified: Secondary | ICD-10-CM | POA: Diagnosis not present

## 2021-09-25 DIAGNOSIS — R6510 Systemic inflammatory response syndrome (SIRS) of non-infectious origin without acute organ dysfunction: Secondary | ICD-10-CM | POA: Diagnosis not present

## 2021-09-25 DIAGNOSIS — N179 Acute kidney failure, unspecified: Secondary | ICD-10-CM | POA: Diagnosis not present

## 2021-09-25 DIAGNOSIS — F329 Major depressive disorder, single episode, unspecified: Secondary | ICD-10-CM | POA: Diagnosis not present

## 2021-09-25 DIAGNOSIS — F411 Generalized anxiety disorder: Secondary | ICD-10-CM | POA: Diagnosis not present

## 2021-09-25 LAB — COMPREHENSIVE METABOLIC PANEL
ALT: 159 U/L — ABNORMAL HIGH (ref 0–44)
AST: 106 U/L — ABNORMAL HIGH (ref 15–41)
Albumin: 2 g/dL — ABNORMAL LOW (ref 3.5–5.0)
Alkaline Phosphatase: 108 U/L (ref 38–126)
Anion gap: 8 (ref 5–15)
BUN: 21 mg/dL (ref 8–23)
CO2: 22 mmol/L (ref 22–32)
Calcium: 8.4 mg/dL — ABNORMAL LOW (ref 8.9–10.3)
Chloride: 108 mmol/L (ref 98–111)
Creatinine, Ser: 1.06 mg/dL (ref 0.61–1.24)
GFR, Estimated: >60 mL/min (ref >60)
Glucose, Bld: 125 mg/dL — ABNORMAL HIGH (ref 70–99)
Potassium: 3.9 mmol/L (ref 3.5–5.1)
Sodium: 138 mmol/L (ref 135–145)
Total Bilirubin: 0.7 mg/dL (ref 0.3–1.2)
Total Protein: 4.4 g/dL — ABNORMAL LOW (ref 6.5–8.1)

## 2021-09-25 LAB — GLUCOSE, CAPILLARY
Glucose-Capillary: 121 mg/dL — ABNORMAL HIGH (ref 70–99)
Glucose-Capillary: 125 mg/dL — ABNORMAL HIGH (ref 70–99)

## 2021-09-25 LAB — CBC
HCT: 38.7 % — ABNORMAL LOW (ref 39.0–52.0)
Hemoglobin: 13.5 g/dL (ref 13.0–17.0)
MCH: 29.7 pg (ref 26.0–34.0)
MCHC: 34.9 g/dL (ref 30.0–36.0)
MCV: 85.2 fL (ref 80.0–100.0)
Platelets: 98 10*3/uL — ABNORMAL LOW (ref 150–400)
RBC: 4.54 MIL/uL (ref 4.22–5.81)
RDW: 16.6 % — ABNORMAL HIGH (ref 11.5–15.5)
WBC: 11.6 10*3/uL — ABNORMAL HIGH (ref 4.0–10.5)
nRBC: 0 % (ref 0.0–0.2)

## 2021-09-25 LAB — PATHOLOGIST SMEAR REVIEW

## 2021-09-25 LAB — CULTURE, BLOOD (ROUTINE X 2)
Culture: NO GROWTH
Special Requests: ADEQUATE

## 2021-09-25 LAB — HEPARIN LEVEL (UNFRACTIONATED): Heparin Unfractionated: 0.35 IU/mL (ref 0.30–0.70)

## 2021-09-25 MED ORDER — ENOXAPARIN SODIUM 80 MG/0.8ML IJ SOSY
80.0000 mg | PREFILLED_SYRINGE | Freq: Two times a day (BID) | INTRAMUSCULAR | Status: DC
  Administered 2021-09-25 – 2021-10-01 (×12): 80 mg via SUBCUTANEOUS
  Filled 2021-09-25 (×12): qty 0.8

## 2021-09-25 MED ORDER — PREDNISONE 20 MG PO TABS
60.0000 mg | ORAL_TABLET | Freq: Every day | ORAL | Status: DC
  Administered 2021-09-26 – 2021-09-28 (×3): 60 mg via ORAL
  Filled 2021-09-25 (×3): qty 3

## 2021-09-25 MED ORDER — ENOXAPARIN SODIUM 80 MG/0.8ML IJ SOSY
80.0000 mg | PREFILLED_SYRINGE | Freq: Once | INTRAMUSCULAR | Status: AC
  Administered 2021-09-25: 80 mg via SUBCUTANEOUS
  Filled 2021-09-25: qty 0.8

## 2021-09-25 MED ORDER — WARFARIN SODIUM 5 MG PO TABS
7.5000 mg | ORAL_TABLET | Freq: Once | ORAL | Status: AC
  Administered 2021-09-25: 7.5 mg via ORAL
  Filled 2021-09-25: qty 1

## 2021-09-25 MED ORDER — WARFARIN - PHARMACIST DOSING INPATIENT: Freq: Every day | Status: DC
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
## 2021-09-25 NOTE — Progress Notes (Signed)
Upper extremity venous right attempted. Per transport team, order to be d/c by MD.

## 2021-09-25 NOTE — Progress Notes (Signed)
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  At baseline, patient is somewhat forgetful and ambulates with a walker but is able to perform his own ADLs.  On Thursday, he abruptly became unable to ambulate and speak in more than single words.  He has recently received a dose of Remicade for his autoimmune colitis.  CT head and MRI brain show no acute abnormalities.  Subjective: Spoke to patient and wife at bedside, improving mentation.  Exam: Vitals:   09/25/21 0400 09/25/21 0800  BP: 111/88 105/64  Pulse: 74   Resp: (!) 21 20  Temp: 98 F (36.7 C) 98 F (36.7 C)  SpO2: 93% 95%   Gen: In bed, NAD Resp: non-labored breathing, no acute distress  NEURO:  Mental Status: Drowsy, states his name. Follows very simple one step commands  He is not able to provide a clear and coherent history of present illness. Speech/Language: speech is in single words and short phrases.  Not repeating or naming objects. Quickly drifts back to sleep No neglect is noted Cranial Nerves:  II: PERRL III, IV, VI: EOMI.   VII: Face is symmetric resting and smiling.  VIII: Hearing intact to voice IX, X:Phonation normal.  XII Tongue midline Motor: 5/5 strength in all extremities Tone is normal. Bulk is normal.  Sensation:  intact to light touch throughout Gait: Deferred  Pertinent Labs: CBC    Component Value Date/Time   WBC 11.6 (H) 09/25/2021 0616   RBC 4.54 09/25/2021 0616   HGB 13.5 09/25/2021 0616   HGB 17.4 (H) 09/19/2021 1148   HCT 38.7 (L) 09/25/2021 0616   PLT 98 (L) 09/25/2021 0616   PLT 108 (L) 09/19/2021 1148   MCV 85.2 09/25/2021 0616   MCH 29.7 09/25/2021 0616   MCHC 34.9 09/25/2021 0616   RDW 16.6 (H) 09/25/2021 0616    LYMPHSABS 1.7 09/23/2021 1153   MONOABS 1.3 (H) 09/23/2021 1153   EOSABS 0.0 09/23/2021 1153   BASOSABS 0.0 09/23/2021 1153       Latest Ref Rng & Units 09/25/2021    3:47 AM 09/24/2021    2:53 AM 09/23/2021    3:32 AM  BMP  Glucose 70 - 99 mg/dL 125  131  110   BUN 8 - 23 mg/dL '21  18  17   '$ Creatinine 0.61 - 1.24 mg/dL 1.06  1.15  1.13   Sodium 135 - 145 mmol/L 138  135  134   Potassium 3.5 - 5.1 mmol/L 3.9  3.6  3.9   Chloride 98 - 111 mmol/L 108  108  105   CO2 22 - 32 mmol/L '22  22  23   '$ Calcium 8.9 - 10.3 mg/dL 8.4  8.0  7.7      Imaging Reviewed:  CT-scan of the brain: No acute abnormality   MRI examination of the brain: Motion degraded study, no acute abnormality  Overnight EEG shows no seizure activity  Assessment: 71 year old patient with history of stage IV renal cancer and autoimmune colitis on Remicade presents with sudden onset altered mental status, fever and inability to ambulate.  CT head and MRI brain show no acute abnormalities.  Overnight EEG shows no seizure activity.   - Patient is improved somewhat on exam, able to interact more and can name objects  and follow simple commands consistently.   - LP equivocal with WBC count of 14, protein of 76. CSF cultures with no growth to date and meningitis/encephalitis panel negative.  Impression: Etiology evasive. Some component of delirium but given consistently improving mentation, agree with holding antimicrobrials and monitoring. [place] considerations include non HSV viral meningitis vs aseptic meningitis from Remcade but that is a diagnosis of exclusion.  Recommendations: - Follow up OCBs and IgG index. - Delirium precautions. - Agree with holding Antimicrobial agents Per ID recs. - We will be available on a as needed basis. Please contact us if Oligoclonal bands or IgG index is positive.  [place] Pager Number [phone]

## 2021-09-25 NOTE — Significant Event (Signed)
Patient's nurse notified me that there was increased bruising on the right upper extremity at around the entry of the PICC line..  On exam at bedside there is bruising around the PICC line entry point.  Patient is able to move extremities without any difficulty.  No localized tenderness.  Has good pulse.  Reviewed patient's medications and labs.  Patient is on heparin infusion.  We will check Dopplers of the right upper extremity check CBC stat.  Continue to monitor.  Tyler Mason

## 2021-09-25 NOTE — Progress Notes (Addendum)
Pt seen for skilled SLP to address dysphagia - full report to follow.  Pt sleeping with open mouth posture initially but woke with stimulation.  He is xerostomic and erythremic.  Pt did accept water via straw given by his wife,Susan RN, without clinical indication of aspiration or significant dysphagia.  His voice and reflexive cough are grossly weak, incr asp pna risk.  Today he articulated 1-3 words - excessively breathy and weak.  Wife reports pt consumed all of his breakfast - thus recommend continue dys1/thin diet currently with strict precautions.  Set up oral suction for wife to use with pt and advised she use toothbrush to remove bacterial laden biofilm.  Manuela Schwartz inquired re: Ensure Plus for pt - RN advised she would message MD.  As long at intake remains good, creamy puree/thin diet advised - Manuela Schwartz agreed with plan.  RNs arrived to room to provide Lovenox injection for pt.  Will continue to follow for dysphagia management.   Kathleen Lime, MS [facility] SLP Acute Rehab Services Office [phone] Pager [phone]

## 2021-09-25 NOTE — Progress Notes (Signed)
RT NOTE:  Pt refuses CPAP tonight. Pt has home machine at bedside if he changes his mind.

## 2021-09-25 NOTE — Progress Notes (Signed)
[facility] for Infectious Disease    Date of Admission:  09/20/2021   Total days of antibiotics 5/off of abtx   ID: Tyler Mason is a 71 y.o. male with   Principal Problem:   SIRS (systemic inflammatory response syndrome) (HCC) Active Problems:   Generalized anxiety disorder   Major depressive disorder   Obstructive sleep apnea   Lupus anticoagulant positive   History of traumatic brain injury   Hypothyroid   Kidney cancer, primary, with metastasis from kidney to other site [facility])   Acute encephalopathy   Fall at home, initial encounter   Generalized weakness   Acute kidney injury ([place])   Transaminitis   Subtherapeutic anticoagulation    Subjective: Ate well this morning, more alert. However sleepy now. Still having intermittent fever  Tolerated LP yesterday  Medications:   ARIPiprazole  5 mg Oral q morning   buPROPion  450 mg Oral q AM   Chlorhexidine Gluconate Cloth  6 each Topical Daily   donepezil  10 mg Oral QHS   enoxaparin (LOVENOX) injection  80 mg Subcutaneous Q12H   influenza vaccine adjuvanted  0.5 mL Intramuscular Tomorrow-1000   levothyroxine  75 mcg Oral Q0600   melatonin  4.5 mg Oral QHS   pantoprazole  40 mg Oral Daily   [START ON 09/26/2021] predniSONE  60 mg Oral Q breakfast   QUEtiapine  25 mg Oral QHS   sodium chloride flush  3 mL Intravenous Q12H   warfarin  7.5 mg Oral ONCE-1600   Warfarin - Pharmacist Dosing Inpatient   Does not apply q1600    Objective: Vital signs in last 24 hours: Temp:  [97.6 F (36.4 C)-98 F (36.7 C)] 98 F (36.7 C) (09/20 0800) Pulse Rate:  [74-90] 74 (09/20 0400) Resp:  [18-26] 22 (09/20 1200) BP: (105-136)/(64-88) 129/70 (09/20 1200) SpO2:  [92 %-95 %] 95 % (09/20 1200)   Physical Exam  Constitutional: He is oriented to person,  He appears well-developed and well-nourished. No distress.  HENT:  Mouth/Throat: Oropharynx is clear and moist. No oropharyngeal exudate.  Cardiovascular: Normal rate,  regular rhythm and normal heart sounds. Exam reveals no gallop and no friction rub.  No murmur heard.  Pulmonary/Chest: Effort normal and breath sounds normal. No respiratory distress. He has no wheezes.  Abdominal: Soft. Bowel sounds are normal. He exhibits no distension. There is no tenderness.  Lymphadenopathy:  He has no cervical adenopathy.  Ext: anasarca Neurological: He is alert and oriented to person,  Skin: Skin is warm and dry. No rash noted. No erythema.    Lab Results Recent Labs    09/24/21 0253 09/25/21 0347 09/25/21 0616  WBC 9.3  --  11.6*  HGB 14.1  --  13.5  HCT 40.4  --  38.7*  NA 135 138  --   K 3.6 3.9  --   CL 108 108  --   CO2 22 22  --   BUN 18 21  --   CREATININE 1.15 1.06  --    Liver Panel Recent Labs    09/23/21 0332 09/25/21 0347  PROT 4.3* 4.4*  ALBUMIN 2.1* 2.0*  AST 42* 106*  ALT 69* 159*  ALKPHOS 63 108  BILITOT 0.8 0.7   CSF: wbc 14, with 12%N, 27%L, 61%Monocyte. Total protein 76. Glu 57  Microbiology: Reviewed ME panel - negative CSF culture is NEGATIVE Studies/Results: DG FL GUIDED LUMBAR PUNCTURE  Result Date: 09/24/2021 CLINICAL DATA:  Acute encephalopathy. EXAM: DIAGNOSTIC  LUMBAR PUNCTURE UNDER FLUOROSCOPIC GUIDANCE COMPARISON:  Brain MRI 09/20/2021 FLUOROSCOPY: Radiation Exposure Index (as provided by the fluoroscopic device): 19.5 mGy PROCEDURE: Informed consent was obtained from the patient's wife prior to the procedure, including potential complications of headache, allergy, and pain. Appropriate preprocedural time-out was performed to verify the patient's identity. An appropriate site for lumbar puncture was determined under fluoroscopic observation at the L3-4 level. With the patient prone, the lower back was prepped with Betadine. 1% Lidocaine was used for local anesthesia. Lumbar puncture was performed at the L3-4 level using a 20 gauge needle with return of clear CSF with an opening pressure of 14 cm water. 8.0 ml of CSF  were obtained for appropriate laboratory studies. The patient tolerated the procedure well and there were no apparent complications. IMPRESSION: Fluoroscopic guided lumbar puncture. Electronically Signed   By: Marijo Sanes M.D.   On: 09/24/2021 11:19     Assessment/Plan: Aseptic meningitis = thought to be drug induced aseptic meningitis, as dx of exclusion. Discussed with dr ghimire. We will recommend to monitor off of abtx.  Will sign off.  [facility] for Infectious Diseases Pager: [phone]  09/25/2021, 2:35 PM

## 2021-09-25 NOTE — Progress Notes (Signed)
Speech Language Pathology Treatment: Dysphagia  Patient Details Name: Tyler Mason MRN: [id number] DOB: 02/19/1950 Today's Date: 09/25/2021 Time: 5681-2751 SLP Time Calculation (min) (ACUTE ONLY): 10 min  Assessment / Plan / Recommendation Clinical Impression  Pt seen today to assess tolerance of po diet. He was sleeping but woke per SLP verbal stimulation.  Wife, Tyler Schwartz, RN present and reports pt consumed all of his breakfast without coughing.  Pt does have xerostomia - and appeared with potential secretions dried on anterior upper dentition. Set up oral suction for Tyler Mason to use as needed and provided toothbrush/paste and oral kits.  Advised she brush Tyler Mason's teeth - not just use the swab - to help clear the biofilm.  Tyler Mason reports her husband has been this weak with similar mentation since the prior Thursday.  Tyler Mason spoke in 1-2 breathy words only due to gross weakness and he is unable to feed himself at this time.  Observed him swallowing thin water via straw - swallow appeared timely and no overt indication of aspiration observed.  RN arrived toward end of session to give pt Lovenox injection.  Recommend currently continue dys1/thin diet with strict precautions.  Wife asking re: Ensure - RN advised she would message MD about this request.  Advised pt to "yell at his wife" to work on strengthening his voice for improved airway protection. Strict aspiration precautions with follow up SLP for dietary advancement readiness and indication for instrumental swallow evaluation.  Wife agreeable at this time to the current plan.    HPI HPI: Pt is a 71 yo male presenting 9/15 after 1 week of worsening confusion and weakness, admitted with sepsis of unclear etiology. MRI motion degraded but without acute findings. CXR without acute cardiopulmonary disease. PMH includes: GERD, hearing loss, major neurocognitive disorder, PNA, TBI (1995), recurrent VTE, stage IV kidney cancer.      SLP Plan  Continue  with current plan of care      Recommendations for follow up therapy are one component of a multi-disciplinary discharge planning process, led by the attending physician.  Recommendations may be updated based on patient status, additional functional criteria and insurance authorization.    Recommendations  Diet recommendations: Dysphagia 1 (puree);Thin liquid Liquids provided via: Cup;Straw Medication Administration: Crushed with puree Supervision: Staff to assist with self feeding;Full supervision/cueing for compensatory strategies Compensations: Slow rate;Small sips/bites Postural Changes and/or Swallow Maneuvers: Seated upright 90 degrees;Upright 30-60 min after meal                Oral Care Recommendations: Oral care BID;Staff/trained caregiver to provide oral care Follow Up Recommendations: Skilled nursing-short term rehab (<3 hours/day) Assistance recommended at discharge: Frequent or constant Supervision/Assistance SLP Visit Diagnosis: Dysphagia, oral phase (R13.11) Plan: Continue with current plan of care          Tyler Lime, MS [place] Office [phone] Pager [phone]  Macario Golds  09/25/2021, 6:03 PM

## 2021-09-25 NOTE — Progress Notes (Signed)
Noted bruising covering elbow and upper arm above PICC site. Assessed with RN present. RN states bruising to upper arm has increased. Edema noted around elbow. Pt is on heparin drip. Recommend contacting MD for possible [place] and any other recommendations.

## 2021-09-25 NOTE — Progress Notes (Signed)
Inpatient Rehab Admissions Coordinator:  ? ?Per therapy recommendations,  patient was screened for CIR candidacy by Rajean Desantiago, MS, CCC-SLP. At this time, Pt. Appears to be a a potential candidate for CIR. I will place   order for rehab consult per protocol for full assessment. Please contact me any with questions. ? ?Ahleah Simko, MS, CCC-SLP ?Rehab Admissions Coordinator  ?[phone] (celll) ?[phone] (office) ? ?

## 2021-09-25 NOTE — Progress Notes (Signed)
ANTICOAGULATION CONSULT NOTE - Follow Up Consult  Pharmacy Consult for heparin - > Lovenox / Warfarin Indication:  history of DVT/PE  Allergies  Allergen Reactions   Ampicillin Other (See Comments)    Made tongue sore Has patient had a PCN reaction causing immediate rash, facial/tongue/throat swelling, SOB or lightheadedness with hypotension: no Has patient had a PCN reaction causing severe rash involving mucus membranes or skin necrosis: no Has patient had a PCN reaction that required hospitalization no Has patient had a PCN reaction occurring within the last 10 years: no If all of the above answers are "NO", then may proceed with Cephalosporin use.    Macrodantin [Nitrofurantoin Macrocrystal] Itching, Swelling and Rash    Patient Measurements: Height: '5\' 10"'$  (177.8 cm) Weight: 77.9 kg (171 lb 11.8 oz) IBW/kg (Calculated) : 73 Heparin Dosing Weight: 77.9 kg (Actual body weight)  Vital Signs: Temp: 98 F (36.7 C) (09/20 0800) Temp Source: Axillary (09/20 0800) BP: 105/64 (09/20 0800) Pulse Rate: 74 (09/20 0400)  Labs: Recent Labs    09/23/21 0332 09/23/21 1153 09/24/21 0253 09/25/21 0347 09/25/21 0616  HGB 13.8 13.4 14.1  --  13.5  HCT 39.2 39.7 40.4  --  38.7*  PLT 105* 112* 94*  --  98*  APTT  --  28  --   --   --   HEPARINUNFRC 0.33  --  <0.10* 0.35  --   CREATININE 1.13  --  1.15 1.06  --     Estimated Creatinine Clearance: 67 mL/min (by C-G formula based on SCr of 1.06 mg/dL).  Medications:  Infusions:   sodium chloride 50 mL/hr at 09/22/21 0841   heparin 950 Units/hr (09/24/21 1811)   Assessment: 21 yoM with PMH of TBI, history of DVT/PE (on warfarin PTA), positive lupus anticoagulant, hyperparathyroidism s/p partial parathyroidectomy, depression and GERD who presented with altered mental status. Patient was on warfarin prior to admission and bridging with enoxaparin 80 mg twice daily. Last dose of enoxaparin 9/16 0615.  Back to Lovenox and Warfarin for  DVT/PE history  Goal of Therapy:  INR 2 to 3 Monitor platelets by anticoagulation protocol: Yes   Plan:  DC heparin and heparin labs  Lovenox 80 mg sq Q 12 hours Warfarin 7.5 mg po x 1 dose tonight Daily INR  Thank you. Anette Guarneri, PharmD 09/25/2021, 10:58 AM

## 2021-09-26 ENCOUNTER — Inpatient Hospital Stay (HOSPITAL_COMMUNITY): Payer: PPO

## 2021-09-26 DIAGNOSIS — G9340 Encephalopathy, unspecified: Secondary | ICD-10-CM | POA: Diagnosis not present

## 2021-09-26 DIAGNOSIS — F329 Major depressive disorder, single episode, unspecified: Secondary | ICD-10-CM | POA: Diagnosis not present

## 2021-09-26 DIAGNOSIS — F411 Generalized anxiety disorder: Secondary | ICD-10-CM | POA: Diagnosis not present

## 2021-09-26 DIAGNOSIS — N179 Acute kidney failure, unspecified: Secondary | ICD-10-CM | POA: Diagnosis not present

## 2021-09-26 LAB — GLUCOSE, CAPILLARY
Glucose-Capillary: 96 mg/dL (ref 70–99)
Glucose-Capillary: 141 mg/dL — ABNORMAL HIGH (ref 70–99)

## 2021-09-26 LAB — PROTIME-INR
INR: 1.2 (ref 0.8–1.2)
Prothrombin Time: 15.3 seconds — ABNORMAL HIGH (ref 11.4–15.2)

## 2021-09-26 LAB — VDRL, CSF: VDRL Quant, CSF: NONREACTIVE

## 2021-09-26 MED ORDER — GERHARDT'S BUTT CREAM
TOPICAL_CREAM | Freq: Two times a day (BID) | CUTANEOUS | Status: DC
  Administered 2021-09-28: 1 via TOPICAL
  Filled 2021-09-26: qty 1

## 2021-09-26 MED ORDER — ARIPIPRAZOLE 5 MG PO TABS
5.0000 mg | ORAL_TABLET | Freq: Every morning | ORAL | Status: DC
  Administered 2021-09-28: 5 mg via ORAL
  Filled 2021-09-26: qty 1

## 2021-09-26 MED ORDER — SODIUM CHLORIDE 0.9 % IV SOLN
2.0000 g | INTRAVENOUS | Status: DC
  Administered 2021-09-26 – 2021-09-30 (×4): 2 g via INTRAVENOUS
  Filled 2021-09-26 (×7): qty 20

## 2021-09-26 MED ORDER — WARFARIN SODIUM 5 MG PO TABS
7.5000 mg | ORAL_TABLET | Freq: Once | ORAL | Status: AC
  Administered 2021-09-26: 7.5 mg via ORAL
  Filled 2021-09-26: qty 1

## 2021-09-26 MED ORDER — GUAIFENESIN ER 600 MG PO TB12
600.0000 mg | ORAL_TABLET | Freq: Two times a day (BID) | ORAL | Status: DC
  Administered 2021-09-26 – 2021-10-01 (×11): 600 mg via ORAL
  Filled 2021-09-26 (×11): qty 1

## 2021-09-26 MED ORDER — METRONIDAZOLE 500 MG/100ML IV SOLN
500.0000 mg | Freq: Two times a day (BID) | INTRAVENOUS | Status: DC
  Administered 2021-09-26 – 2021-09-28 (×4): 500 mg via INTRAVENOUS
  Filled 2021-09-26 (×4): qty 100

## 2021-09-26 MED ORDER — DIPHENOXYLATE-ATROPINE 2.5-0.025 MG PO TABS
1.0000 | ORAL_TABLET | Freq: Four times a day (QID) | ORAL | Status: DC
  Administered 2021-09-26 – 2021-10-01 (×21): 1 via ORAL
  Filled 2021-09-26 (×22): qty 1

## 2021-09-26 MED ORDER — ENSURE ENLIVE PO LIQD
237.0000 mL | Freq: Three times a day (TID) | ORAL | Status: DC
  Administered 2021-09-26 – 2021-10-01 (×13): 237 mL via ORAL

## 2021-09-26 NOTE — Progress Notes (Signed)
IP rehab admissions - I have received a denial for acute inpatient rehab admission.  Wife is aware of denial.  I will speak with wife to determine next steps.  Call me for questions.  [phone]
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
## 2021-09-26 NOTE — TOC Progression Note (Addendum)
Transition of Care [facility]) - Progression Note    Patient Details  Name: Tyler Mason MRN: [id number] Date of Birth: 1950-02-27  Transition of Care [facility]) CM/SW [place], LCSW Phone Number: 09/26/2021, 4:02 PM  Clinical Narrative:    CSW left voicemail for patient's spouse to provide SNF bed offers in the event that CIR appeal is upheld.   Spouse returned call and CSW provided SNF bed offers and emailed ratings to her at [email or web address]   Expected Discharge Plan: [place] Barriers to Discharge: Ship broker, Continued Medical Work up, SNF Pending bed offer  Expected Discharge Plan and Services Expected Discharge Plan: [place] Choice: [place] arrangements for the past 2 months: Single Family Home                                       Social Determinants of Health (SDOH) Interventions    Readmission Risk Interventions     No data to display

## 2021-09-26 NOTE — Progress Notes (Signed)
Physical Therapy Treatment Patient Details Name: Tyler Mason MRN: [id number] DOB: [birth year] Today's Date: 09/26/2021   History of Present Illness 71 yo male presenting 9/15 after 1 week of worsening confusion and weakness, admitted with sepsis of unclear etiology. MRI motion degraded but without acute findings. CXR without acute cardiopulmonary disease. Bedside LP 9/18 unsuccessful, plan for reattempt in radiology next date. PMH includes: GERD, hearing loss, major neurocognitive disorder, PNA, TBI (1995), recurrent VTE, stage IV kidney ca.    PT Comments    Pt received in supine, agreeable to therapy session and with good participation and tolerance for transfer and pre-gait activity for BLE strengthening. Pt with slow processing but good effort within tolerance, able to continue instruction on transfers to EOB and standing with Stedy frame, pt needing +2 modA to maxA to perform functional mobility tasks this date. Plan to assess pivotal transfers next date if medically appropriate. Pt with secretion-laden cough this date, RN/family aware. Pt up in recliner with pressure relief cushion in place and spouse present at end of session. Pt continues to benefit from PT services to progress toward functional mobility goals.    Recommendations for follow up therapy are one component of a multi-disciplinary discharge planning process, led by the attending physician.  Recommendations may be updated based on patient status, additional functional criteria and insurance authorization.  Follow Up Recommendations  Acute inpatient rehab (3hours/day) Can patient physically be transported by private vehicle: Yes (currently would need wheelchair accessible van)   Assistance Recommended at Discharge Frequent or constant Supervision/Assistance  Patient can return home with the following A lot of help with walking and/or transfers;A lot of help with bathing/dressing/bathroom;Assist for transportation;Help with  stairs or ramp for entrance   Equipment Recommendations  None recommended by PT (defer to post-acute)    Recommendations for Other Services       Precautions / Restrictions Precautions Precautions: Fall Precaution Comments: rectal tube, catheter Restrictions Weight Bearing Restrictions: No     Mobility  Bed Mobility Overal bed mobility: Needs Assistance Bed Mobility: Supine to Sit, Sit to Supine     Supine to sit: +2 for physical assistance, Max assist, HOB elevated Sit to supine: +2 for physical assistance, Max assist, HOB elevated   General bed mobility comments: cues for log roll sequencing toward his R side, pt assisting to raise trunk with RUE and to shift hips forward to foot flat, pt needing up to maxA via bed pad to complete action but puts forth good effort    Transfers Overall transfer level: Needs assistance Equipment used: Ambulation equipment used Transfers: Sit to/from Stand, Bed to chair/wheelchair/BSC Sit to Stand: Mod assist, +2 physical assistance, From elevated surface           General transfer comment: cues for body mechanics, pt stand from EOB and Stedy seat with +2 modA and increased time to perform. Able to weight shift 3-4 times each LE standing in Stedy frame, then fatigues, needing to sit in recliner. Third person present to manage lines during mobility for pt safety. Transfer via Lift Equipment: Stedy  Ambulation/Gait Ambulation/Gait assistance: Mod assist, +2 physical assistance           Pre-gait activities: standing weight shifting in standing frame x3 reps each LE prior to fatigue   ankin (Stroke Patients Only)       Balance Overall balance assessment: Needs assistance Sitting-balance support: Single extremity supported, Feet unsupported Sitting balance-Leahy Scale: Poor Sitting balance - Comments: needing min A support, leans  to R Postural control: Posterior lean, Right lateral lean Standing balance support: Bilateral upper  extremity supported, During functional activity Standing balance-Leahy Scale: Poor Standing balance comment: reliant on external support +2 for pt safety, stands ~20-30 seconds total each attempt (x2 attempts)                            Cognition Arousal/Alertness: Awake/alert Behavior During Therapy: Flat affect Overall Cognitive Status: Impaired/Different from baseline Area of Impairment: Attention, Following commands, Safety/judgement, Problem solving, Awareness                   Current Attention Level: Focused   Following Commands: Follows one step commands inconsistently, Follows one step commands with increased time Safety/Judgement: Decreased awareness of deficits, Decreased awareness of safety Awareness: Intellectual Problem Solving: Slow processing, Requires verbal cues, Requires tactile cues, Difficulty sequencing, Decreased initiation General Comments: Flat affect, responds most consistently to yes/no questions. Pauses in response time up to 10 seconds. Pt with very soft/low volume voice and increased secretion-laden cough this date.        Exercises Other Exercises Other Exercises: supine BLE AROM: ankle pumps, heel slides, hip abduction x5 reps ea Other Exercises: standing weight shifting x 4 reps ea LE in [place] Other Exercises: STS x 2 trials and static standing up to 30 sec at a time    General Comments General comments (skin integrity, edema, etc.): HR 87-94 bpm with exertion; SpO2 97% on RA; BP 128/68 (86) supine prior to OOB; BP 134/63 (83) after sitting up in chair      Pertinent Vitals/Pain Pain Assessment Pain Assessment: Faces Faces Pain Scale: Hurts a little bit Pain Location: intermittent grimacing, not localized Pain Descriptors / Indicators: Grimacing Pain Intervention(s): Monitored during session, Repositioned           PT Goals (current goals can now be found in the care plan section) Acute Rehab PT Goals Patient Stated Goal:  home if possible PT Goal Formulation: With patient Time For Goal Achievement: 10/05/21 Progress towards PT goals: Progressing toward goals    Frequency    Min 4X/week (incorrect freq set previous date, corrected per protocol)      PT Plan Current plan remains appropriate       AM-PAC PT "6 Clicks" Mobility   Outcome Measure  Help needed turning from your back to your side while in a flat bed without using bedrails?: A Lot Help needed moving from lying on your back to sitting on the side of a flat bed without using bedrails?: Total (+2 max A) Help needed moving to and from a bed to a chair (including a wheelchair)?: Total Help needed standing up from a chair using your arms (e.g., wheelchair or bedside chair)?: A Lot Help needed to walk in hospital room?: Total Help needed climbing 3-5 steps with a railing? : Total 6 Click Score: 8    End of Session Equipment Utilized During Treatment: Gait belt Activity Tolerance: Patient tolerated treatment well Patient left: in chair;with call bell/phone within reach;with family/visitor present;with nursing/sitter in room;Other (comment) (RN Woody in room to assist pt with suctioning of secretions) Nurse Communication: Mobility status;Need for lift equipment PT Visit Diagnosis: Other abnormalities of gait and mobility (R26.89);Muscle weakness (generalized) (M62.81)     Time: 3785-8850 PT Time Calculation (min) (ACUTE ONLY): 24 min  Charges:  $Therapeutic Exercise: 8-22 mins $Therapeutic Activity: 8-22 mins  Houston Siren., PTA Acute Rehabilitation Services Secure Chat Preferred 9a-5:30pm Office: [place] 09/26/2021, 6:40 PM

## 2021-09-26 NOTE — Progress Notes (Addendum)
Initial Nutrition Assessment  DOCUMENTATION CODES:   Not applicable  INTERVENTION:  - Continue Ensure TID  - Add Magic Cup q day    NUTRITION DIAGNOSIS:   Increased nutrient needs (protein) related to cancer and cancer related treatments as evidenced by mild muscle depletion.  GOAL:   Patient will meet greater than or equal to 90% of their needs   MONITOR:   Supplement acceptance, PO intake, Weight trends  REASON FOR ASSESSMENT:   Consult Assessment of nutrition requirement/status  ASSESSMENT:   71 y.o. male who admits related to being found acutely altered. 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 Pt with Stage IV kidney cancer. Pt developed diarrhea after starting medication for cancer treatment back in February 2023. Pt is currently receiving medical management for SIRS.  Scheduled Meds: Lomotil, warfarin. IVF: NS @ 50 mL/hr. Labs reviewed.   Consult for assessment of nutritional status. Pt was drowsy at time of assessment however the pt's wife was present at time of assessment. The wife reports that the pt has been eating well over the past 2 days and has been eating roughly 80% of his meal trays. She states that prior to that though, the patient was not eating much of anything for the first 4 days of his admission. She also reports that her husband was eating well PTA and no real issues with appetite. She does state that he has lost about 20 lbs since February. Per report and wt hx record, pt's weight loss is not significant.   Pt with fecal management system in place. Pt's wife reports that the pt has been having issues with diarrhea since starting medication for cancer. She states it started at the beginning of August 2023.   Pt has Ensure TID ordered. RD will also add magic cup q day because pt's wife states that he loves icecream.   Pt currently being followed by SLP and  placed on a Dys1 diet due to cognitive delay. Wife reports that he has been tolerating the pureed diet well with no issues.   NUTRITION - FOCUSED PHYSICAL EXAM:  Flowsheet Row Most Recent Value  Orbital Region No depletion  Upper Arm Region No depletion  Thoracic and Lumbar Region No depletion  Buccal Region No depletion  Temple Region Moderate depletion  Clavicle Bone Region No depletion  Clavicle and Acromion Bone Region Mild depletion  Scapular Bone Region Unable to assess  Dorsal Hand No depletion  Patellar Region No depletion  Anterior Thigh Region Mild depletion  Posterior Calf Region Mild depletion  Edema (RD Assessment) Mild  Hair Reviewed  Eyes Reviewed  Mouth Reviewed  Skin Reviewed  Nails Reviewed  [pale nail beds]       Diet Order:   Diet Order             DIET - DYS 1 Room service appropriate? No; Fluid consistency: Thin  Diet effective now                   EDUCATION NEEDS:   Not appropriate for education at this time  Skin:  Skin Assessment: Reviewed RN Assessment  Last BM:     Height:   Ht Readings from Last 1 Encounters:  09/21/21 '5\' 10"'$  (1.778 m)    Weight:   Wt Readings from Last 1 Encounters:  09/21/21 77.9 kg    Ideal Body Weight:     BMI:  Body mass index is  24.64 kg/m.  Estimated Nutritional Needs:   Kcal:  1950-2340 kcal  Protein:  95-115 gm  Fluid:  1950-2340 mL    Thalia Bloodgood, RD, LDN, CNSC

## 2021-09-26 NOTE — Progress Notes (Signed)
ANTICOAGULATION CONSULT NOTE - Follow Up Consult  Pharmacy Consult for heparin - > Lovenox / Warfarin Indication:  history of DVT/PE  Allergies  Allergen Reactions   Ampicillin Other (See Comments)    Made tongue sore Has patient had a PCN reaction causing immediate rash, facial/tongue/throat swelling, SOB or lightheadedness with hypotension: no Has patient had a PCN reaction causing severe rash involving mucus membranes or skin necrosis: no Has patient had a PCN reaction that required hospitalization no Has patient had a PCN reaction occurring within the last 10 years: no If all of the above answers are "NO", then may proceed with Cephalosporin use.    Macrodantin [Nitrofurantoin Macrocrystal] Itching, Swelling and Rash    Patient Measurements: Height: '5\' 10"'$  (177.8 cm) Weight: 77.9 kg (171 lb 11.8 oz) IBW/kg (Calculated) : 73 Heparin Dosing Weight: 77.9 kg (Actual body weight)  Vital Signs: Temp: 97.8 F (36.6 C) (09/21 0400) Temp Source: Axillary (09/21 0400) BP: 129/57 (09/21 0400) Pulse Rate: 80 (09/21 0000)  Labs: Recent Labs    09/23/21 1153 09/24/21 0253 09/25/21 0347 09/25/21 0616 09/26/21 0422  HGB 13.4 14.1  --  13.5  --   HCT 39.7 40.4  --  38.7*  --   PLT 112* 94*  --  98*  --   APTT 28  --   --   --   --   LABPROT  --   --   --   --  15.3*  INR  --   --   --   --  1.2  HEPARINUNFRC  --  <0.10* 0.35  --   --   CREATININE  --  1.15 1.06  --   --     Estimated Creatinine Clearance: 67 mL/min (by C-G formula based on SCr of 1.06 mg/dL).  Medications:  Infusions:   sodium chloride 50 mL/hr at 09/25/21 1135   Assessment: 39 yoM with PMH of TBI, history of DVT/PE (on warfarin PTA), positive lupus anticoagulant, hyperparathyroidism s/p partial parathyroidectomy, depression and GERD who presented with altered mental status. Patient was on warfarin prior to admission and bridging with enoxaparin 80 mg twice daily. Last dose of enoxaparin 9/16 0615.  Back  to Lovenox and Warfarin for DVT/PE history  Goal of Therapy:  INR 2 to 3 Monitor platelets by anticoagulation protocol: Yes   Plan:  DC heparin and heparin labs  Lovenox 80 mg sq Q 12 hours Repeat Warfarin 7.5 mg po x 1 dose tonight Daily INR  Thank you. Anette Guarneri, PharmD 09/26/2021, 7:32 AM

## 2021-09-27 DIAGNOSIS — F411 Generalized anxiety disorder: Secondary | ICD-10-CM | POA: Diagnosis not present

## 2021-09-27 DIAGNOSIS — N179 Acute kidney failure, unspecified: Secondary | ICD-10-CM | POA: Diagnosis not present

## 2021-09-27 DIAGNOSIS — F329 Major depressive disorder, single episode, unspecified: Secondary | ICD-10-CM | POA: Diagnosis not present

## 2021-09-27 DIAGNOSIS — G9340 Encephalopathy, unspecified: Secondary | ICD-10-CM | POA: Diagnosis not present

## 2021-09-27 LAB — IGG CSF INDEX
Albumin CSF-mCnc: 46 mg/dL (ref 15–55)
Albumin: 2.7 g/dL — ABNORMAL LOW (ref 3.9–4.9)
CSF IgG Index: 0.6 (ref 0.0–0.7)
IgG (Immunoglobin G), Serum: 541 mg/dL — ABNORMAL LOW (ref 603–1613)
IgG, CSF: 5.1 mg/dL (ref 0.0–10.3)
IgG/Alb Ratio, CSF: 0.11 (ref 0.00–0.25)

## 2021-09-27 LAB — CSF CULTURE W GRAM STAIN: Culture: NO GROWTH

## 2021-09-27 LAB — PROTIME-INR
INR: 1.3 — ABNORMAL HIGH (ref 0.8–1.2)
Prothrombin Time: 16.1 seconds — ABNORMAL HIGH (ref 11.4–15.2)

## 2021-09-27 LAB — GLUCOSE, CAPILLARY
Glucose-Capillary: 95 mg/dL (ref 70–99)
Glucose-Capillary: 118 mg/dL — ABNORMAL HIGH (ref 70–99)
Glucose-Capillary: 132 mg/dL — ABNORMAL HIGH (ref 70–99)
Glucose-Capillary: 108 mg/dL — ABNORMAL HIGH (ref 70–99)

## 2021-09-27 LAB — FUNGUS STAIN

## 2021-09-27 LAB — FUNGAL STAIN REFLEX

## 2021-09-27 MED ORDER — WARFARIN SODIUM 5 MG PO TABS
7.5000 mg | ORAL_TABLET | Freq: Once | ORAL | Status: AC
  Administered 2021-09-27: 7.5 mg via ORAL
  Filled 2021-09-27: qty 1
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
## 2021-09-27 NOTE — Progress Notes (Signed)
Speech Language Pathology Treatment: Dysphagia  Patient Details Name: Tyler Mason MRN: [id number] DOB: 1950/10/25 Today's Date: 09/27/2021 Time: 1205-1238 SLP Time Calculation (min) (ACUTE ONLY): 33 min  Assessment / Plan / Recommendation Clinical Impression  Pt seen today to address his dysphagia - Chest Imaging 09/26/2021 showed ATX or atypical pna and pt now has has elevated WBC, he is being treated with antibiotic.  Congested breathing heard (sounds as if more in upper airway) without pt adequate ability to clear despite cued cough attempts. Pt's wife, Tyler Mason, reports pt was suctioned yeterday removing sectetions and which improved pt's pulmonary status.  Today pt provided with thin, nectar thick liquids and applesauce via  small single boluses.  Delayed swallow noted and his laryngeal elevation palpated clincially may be diminished.  Delayed congested cough noted across all consistencies.  SLP suspects pt is having aspiration at least of secretions and potentially po intake.  Tyler Mason reports pt was eating well - sitting up right PTA and had no issues prior to current admission. Suspect current illness, gross deconditioning impairing swallowing and airway clearance and that pt may have had subtle dysphagia PTA given his progressive neurological d/o and h/o pna.     Discussed with wife, Tyler Mason, options to maintain current plan with aspiration precautions to mitigate risk or proceed with MBS for instrumental testing.  She opted for MBS to have definitive information for future plans.  Unfortunately xray unable to accommodate MBS today - therefore will plan next date.  Tyler Mason stated "Well then I may have to decide about feeding tube".  Discussed that risks and benefits exist with all care plans, including testing, diet modification, feeding tubes, etc.  Encouraged Tyler Mason (who is a nurse in our sytem) to look up info on outcomes of long term feeding tube in patients who have progressive cognitive decline.   Pts still aspirate secretions and risks including diarrhea etc may impact tolerance.  She also stated "Then will he get over this pneumonia?" demonstrating understanding of current situation.  Encouraged Tyler Mason to give pt minimal amounts today only as tolerated, sitting fully upright and encouraging cough/expectoration for airway clearance.  Risks may outweigh benefits of large amounts of po at this time.    Tyler Mason is an excellent caregiver of this pt and agreeable to current plan.  Messaged RN and MD with concerns, information.  HPI HPI: Pt is a 71 yo male presenting 9/15 after 1 week of worsening confusion and weakness, admitted with sepsis of unclear etiology. MRI motion degraded but without acute findings. CXR without acute cardiopulmonary disease. PMH includes: GERD, hearing loss, major neurocognitive disorder, PNA, dementia vs PSP,  TBI (1995), recurrent VTE, stage IV kidney cancer.      SLP Plan  New goals to be determined pending instrumental study;Continue with current plan of care      Recommendations for follow up therapy are one component of a multi-disciplinary discharge planning process, led by the attending physician.  Recommendations may be updated based on patient status, additional functional criteria and insurance authorization.    Recommendations  Diet recommendations: Thin liquid;Nectar-thick liquid (SMALL AMOUNTS ONLY) Liquids provided via: Cup;Teaspoon Medication Administration: Crushed with puree Supervision: Staff to assist with self feeding;Full supervision/cueing for compensatory strategies Compensations: Slow rate;Small sips/bites (stop intake if pt coughing) Postural Changes and/or Swallow Maneuvers: Seated upright 90 degrees;Upright 30-60 min after meal                Oral Care Recommendations: Oral care BID;Staff/trained caregiver to provide oral  care Follow Up Recommendations: Skilled nursing-short term rehab (<3 hours/day) Assistance recommended at  discharge: Frequent or constant Supervision/Assistance SLP Visit Diagnosis: Dysphagia, oropharyngeal phase (R13.12);Dysphagia, unspecified (R13.10) Plan: New goals to be determined pending instrumental study;Continue with current plan of care          Kathleen Lime, MS [place] Office [phone] Pager [phone]  Macario Golds  09/27/2021, 1:09 PM

## 2021-09-27 NOTE — Progress Notes (Signed)
Occupational Therapy Treatment Patient Details Name: Tyler Mason MRN: [id number] DOB: 1950-02-07 Today's Date: 09/27/2021   History of present illness 71 yo male presenting 9/15 after 1 week of worsening confusion and weakness, admitted with sepsis of unclear etiology. MRI motion degraded but without acute findings. CXR without acute cardiopulmonary disease. Bedside LP 9/18 unsuccessful, plan for reattempt in radiology next date. PMH includes: GERD, hearing loss, major neurocognitive disorder, PNA, TBI (1995), recurrent VTE, stage IV kidney ca.   OT comments  Pt seen in conjunction with PT to maximize activity tolerance and optimize participation. Pt continues to present with impaired balance, decreased activity tolerance and decreased strength and endurance impacting pts ability to complete BADLs independently. Pt currently requires MODA  +2 for sit>stand from EOB and MAX A +2 to pivot with RW as pt requires most assist to manage RW and advance RLE during pivotal steps. Wife reports pt is "stoic" at baseline as pt was flat during session. Of note [facility] has denied admission x2 and wife is now open to [facility], but would prefer to take him home if pt could be ambulatory with + 1 assist, alerted OTR about change in POC from [facility] now to SNF. Pt would continue to benefit from skilled occupational therapy while admitted and after d/c to address the below listed limitations in order to improve overall functional mobility and facilitate independence with BADL participation. DC plan remains appropriate, will follow acutely per POC.     Recommendations for follow up therapy are one component of a multi-disciplinary discharge planning process, led by the attending physician.  Recommendations may be updated based on patient status, additional functional criteria and insurance authorization.    Follow Up Recommendations  Skilled nursing-short term rehab (<3 hours/day)    Assistance Recommended at Discharge  Frequent or constant Supervision/Assistance  Patient can return home with the following  Two people to help with walking and/or transfers;A lot of help with bathing/dressing/bathroom;Assistance with cooking/housework;Assistance with feeding;Direct supervision/assist for medications management;Direct supervision/assist for financial management;Assist for transportation;Help with stairs or ramp for entrance   Equipment Recommendations  Other (comment) (defer)    Recommendations for Other Services      Precautions / Restrictions Precautions Precautions: Fall Precaution Comments: rectal tube Restrictions Weight Bearing Restrictions: No       Mobility Bed Mobility Overal bed mobility: Needs Assistance Bed Mobility: Supine to Sit, Rolling Rolling: Max assist   Supine to sit: +2 for physical assistance, Max assist, HOB elevated     General bed mobility comments: cues for log roll sequencing toward his R side, pt assisting to raise trunk with RUE and to shift hips forward to foot flat, pt needing up to maxA via bed pad to complete action but puts forth effort with dense cues and delay.    Transfers Overall transfer level: Needs assistance Equipment used: Rolling walker (2 wheels) Transfers: Sit to/from Stand, Bed to chair/wheelchair/BSC Sit to Stand: Mod assist, +2 physical assistance, From elevated surface     Step pivot transfers: Max assist, +2 physical assistance, From elevated surface     General transfer comment: cues for body mechanics, and hand over hand assist for placement on RW during transfers, pt needing increased time to perform and needs multimodal cues for step sequencing and manual assist for RW mgmt. Pt ultimately needing max to totalA for RLE placement during steps to pivot to chair.     Balance Overall balance assessment: Needs assistance Sitting-balance support: Single extremity supported, Feet unsupported  Sitting balance-Leahy Scale: Poor Sitting balance -  Comments: initially minA, progressing to mod/maxA with posterior lean with fatigue Postural control: Posterior lean Standing balance support: Bilateral upper extremity supported, During functional activity Standing balance-Leahy Scale: Poor Standing balance comment: reliant on external support +2 for pt safety                           ADL either performed or assessed with clinical judgement   ADL Overall ADL's : Needs assistance/impaired                         Toilet Transfer: Minimal assistance;+2 for physical assistance;Stand-pivot;Rolling walker (2 wheels) Toilet Transfer Details (indicate cue type and reason): simulated with stand pivot to recliner         Functional mobility during ADLs: Minimal assistance;+2 for physical assistance;+2 for safety/equipment;Rolling walker (2 wheels) General ADL Comments: pts ADL participation limited by impaired cog, generalized deconitioning and impaired strength/endurance    Extremity/Trunk Assessment Upper Extremity Assessment Upper Extremity Assessment: Generalized weakness   Lower Extremity Assessment Lower Extremity Assessment: Defer to PT evaluation        Vision Baseline Vision/History: 1 Wears glasses Additional Comments: disconjugate gaze, will further assess, 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, wife reports had double vision at baseline from TBI   Perception Perception Perception: Not tested   Praxis Praxis Praxis: Not tested    Cognition Arousal/Alertness: Awake/alert Behavior During Therapy: Flat affect Overall Cognitive Status: Impaired/Different from baseline Area of Impairment: Attention, Following commands, Safety/judgement, Problem solving, Awareness                   Current Attention Level: Focused   Following Commands: Follows one step commands inconsistently, Follows one step commands with increased time Safety/Judgement: Decreased awareness of deficits, Decreased  awareness of safety Awareness: Intellectual Problem Solving: Slow processing, Requires verbal cues, Requires tactile cues, Difficulty sequencing, Decreased initiation General Comments: Flat affect, responds most consistently to yes/no questions. Pauses in response time up to 10 seconds. Pt with very soft/low volume voice and increased secretion-laden cough this date. Pt states "I'm weak" when asked how he was feeling, otherwise minimally verbal other than yes/no. pt did respond with a big "yes"when asked if he liked [employer]        Exercises      Shoulder Instructions       General Comments BP 133/74 (91) seated. pt c/o lightheadedness when prompted but may have been just echoing therapist words. HR/SpO2 WFL on RA. Increased secretion/laden cough, pt may need suctioning assist to clear secretions. wife present during session    Pertinent Vitals/ Pain       Pain Assessment Pain Assessment: Faces Faces Pain Scale: Hurts a little bit Pain Location: intermittent grimacing, not localized Pain Descriptors / Indicators: Grimacing Pain Intervention(s): Monitored during session, Repositioned  Home Living                                          Prior Functioning/Environment              Frequency  Min 2X/week        Progress Toward Goals  OT Goals(current goals can now be found in the care plan section)  Progress towards OT goals: Progressing toward goals  Acute  Rehab OT Goals OT Goal Formulation: Patient unable to participate in goal setting Time For Goal Achievement: 10/05/21 Potential to Achieve Goals: [place] Discharge plan remains appropriate;Frequency remains appropriate    Co-evaluation      Reason for Co-Treatment: Complexity of the patient's impairments (multi-system involvement);Necessary to address cognition/behavior during functional activity;For patient/therapist safety;To address functional/ADL transfers PT goals addressed during  session: Balance;Mobility/safety with mobility;Proper use of DME;Strengthening/ROM OT goals addressed during session: ADL's and self-care      AM-PAC OT "6 Clicks" Daily Activity     Outcome Measure   Help from another person eating meals?: A Little Help from another person taking care of personal grooming?: A Little Help from another person toileting, which includes using toliet, bedpan, or urinal?: A Lot Help from another person bathing (including washing, rinsing, drying)?: A Lot Help from another person to put on and taking off regular upper body clothing?: A Lot Help from another person to put on and taking off regular lower body clothing?: A Lot 6 Click Score: 14    End of Session Equipment Utilized During Treatment: Gait belt;Rolling walker (2 wheels)  OT Visit Diagnosis: Unsteadiness on feet (R26.81);Other abnormalities of gait and mobility (R26.89);Muscle weakness (generalized) (M62.81);Other symptoms and signs involving cognitive function   Activity Tolerance Patient tolerated treatment well   Patient Left in chair;with call bell/phone within reach;with family/visitor present   Nurse Communication Mobility status        Time: 3335-4562 OT Time Calculation (min): 32 min  Charges: OT General Charges $OT Visit: 1 Visit OT Treatments $Therapeutic Activity: 8-22 mins  Harley Alto., COTA/L Acute Rehabilitation Services [phone]  Precious Haws 09/27/2021, 3:48 PM
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
## 2021-09-27 NOTE — Progress Notes (Addendum)
Physical Therapy Treatment Patient Details Name: Tyler Mason MRN: [id number] DOB: 1950/12/24 Today's Date: 09/27/2021   History of Present Illness 71 yo male presenting 9/15 after 1 week of worsening confusion and weakness, admitted with sepsis of unclear etiology. MRI motion degraded but without acute findings. CXR without acute cardiopulmonary disease. Bedside LP 9/18 unsuccessful, plan for reattempt in radiology next date. PMH includes: GERD, hearing loss, major neurocognitive disorder, PNA, TBI (1995), recurrent VTE, stage IV kidney ca.    PT Comments    Pt received in supine, spouse present and encouraging, pt agreeable to therapy session with emphasis on transfer and pre-gait training. Pt needing up to +2 maxA for bed mobility and pre-gait tasks with RW, min to maxA for seated balance and +2 modA for sit<>stand transfers. Pt making good progress toward goals however per chart review pt unfortunately not approved for higher intensity therapies by insurance so disposition updated below after discussion with pt spouse and supervising PT Alexa S.  Pt would benefit from low to medium intensity post-acute rehab to address strength/endurance deficits as pt was modI at baseline prior to admission. Pt spouse reports he will need to be ambulating with +1 assist in order to DC home with 24/7 assist once at home. Pt continues to benefit from PT services to progress toward functional mobility goals.   Recommendations for follow up therapy are one component of a multi-disciplinary discharge planning process, led by the attending physician.  Recommendations may be updated based on patient status, additional functional criteria and insurance authorization.  Follow Up Recommendations  Skilled nursing-short term rehab (<3 hours/day) Can patient physically be transported by private vehicle: No   Assistance Recommended at Discharge Frequent or constant Supervision/Assistance  Patient can return home with the  following A lot of help with bathing/dressing/bathroom;Assist for transportation;Help with stairs or ramp for entrance;Two people to help with walking and/or transfers   Equipment Recommendations  None recommended by PT (TBD)    Recommendations for Other Services       Precautions / Restrictions Precautions Precautions: Fall Precaution Comments: rectal tube Restrictions Weight Bearing Restrictions: No     Mobility  Bed Mobility Overal bed mobility: Needs Assistance Bed Mobility: Supine to Sit, Sit to Supine Rolling: Max assist   Supine to sit: +2 for physical assistance, Max assist, HOB elevated     General bed mobility comments: cues for log roll sequencing toward his R side, pt assisting to raise trunk with RUE and to shift hips forward to foot flat, pt needing up to maxA via bed pad to complete action but puts forth effort with dense cues and delay.    Transfers Overall transfer level: Needs assistance Equipment used: Rolling walker (2 wheels) Transfers: Sit to/from Stand, Bed to chair/wheelchair/BSC Sit to Stand: Mod assist, +2 physical assistance, From elevated surface   Step pivot transfers: Max assist, +2 physical assistance, From elevated surface       General transfer comment: cues for body mechanics, and hand over hand assist for placement on RW during transfers, pt needing increased time to perform and needs multimodal cues for step sequencing and manual assist for RW mgmt. Pt ultimately needing max to totalA for RLE placement during steps to pivot to chair.    Ambulation/Gait Ambulation/Gait assistance: +2 physical assistance, Max assist Gait Distance (Feet): 3 Feet Assistive device: Rolling walker (2 wheels) Gait Pattern/deviations: Step-to pattern, Knee hyperextension - right, Trunk flexed, Leaning posteriorly     Pre-gait activities: marching x5 reps on  RLE then on LLE General Gait Details: max to totalA for RLE placement and RW mgmt, pivotal steps from  bed>chair and pre-gait hip flexion x5 reps ea LE      Balance Overall balance assessment: Needs assistance Sitting-balance support: Single extremity supported, Feet unsupported Sitting balance-Leahy Scale: Poor Sitting balance - Comments: initially minA, progressing to mod/maxA with posterior lean with fatigue Postural control: Posterior lean Standing balance support: Bilateral upper extremity supported, During functional activity Standing balance-Leahy Scale: Poor Standing balance comment: reliant on external support +2 for pt safety                            Cognition Arousal/Alertness: Awake/alert Behavior During Therapy: Flat affect Overall Cognitive Status: Impaired/Different from baseline Area of Impairment: Attention, Following commands, Safety/judgement, Problem solving, Awareness                   Current Attention Level: Focused   Following Commands: Follows one step commands inconsistently, Follows one step commands with increased time Safety/Judgement: Decreased awareness of deficits, Decreased awareness of safety Awareness: Intellectual Problem Solving: Slow processing, Requires verbal cues, Requires tactile cues, Difficulty sequencing, Decreased initiation General Comments: Flat affect, responds most consistently to yes/no questions. Pauses in response time up to 10 seconds. Pt with very soft/low volume voice and increased secretion-laden cough this date. Pt states "I'm weak" when asked how he was feeling, otherwise minimally verbal other than yes/no.        Exercises Other Exercises Other Exercises: supine BLE AROM: ankle pumps, heel slides x5-10 reps ea Other Exercises: standing hip flexion x5 reps ea LE Other Exercises: STS from EOB and from chair and static standing for strengthening with postural cues ~1-2 minutes prior to gait instruction x2 trials    General Comments General comments (skin integrity, edema, etc.): BP 133/74 (91) seated.  pt c/o lightheadedness when prompted but may have been just echoing therapist words. HR/SpO2 WFL on RA. Increased secretion/laden cough, pt may need suctioning assist to clear secretions.      Pertinent Vitals/Pain Pain Assessment Pain Assessment: Faces Faces Pain Scale: Hurts a little bit Pain Location: intermittent grimacing, not localized Pain Descriptors / Indicators: Grimacing Pain Intervention(s): Monitored during session, Repositioned           PT Goals (current goals can now be found in the care plan section) Acute Rehab PT Goals Patient Stated Goal: home if possible PT Goal Formulation: With patient Time For Goal Achievement: 10/05/21 Progress towards PT goals: Progressing toward goals    Frequency    Min 3X/week      PT Plan Discharge plan needs to be updated    Co-evaluation PT/OT/SLP Co-Evaluation/Treatment: Yes Reason for Co-Treatment: Complexity of the patient's impairments (multi-system involvement);Necessary to address cognition/behavior during functional activity;For patient/therapist safety;To address functional/ADL transfers PT goals addressed during session: Balance;Mobility/safety with mobility;Proper use of DME;Strengthening/ROM        AM-PAC PT "6 Clicks" Mobility   Outcome Measure  Help needed turning from your back to your side while in a flat bed without using bedrails?: A Lot Help needed moving from lying on your back to sitting on the side of a flat bed without using bedrails?: Total (+2 max A) Help needed moving to and from a bed to a chair (including a wheelchair)?: Total (+2 maxA) Help needed standing up from a chair using your arms (e.g., wheelchair or bedside chair)?: A Lot Help needed to walk in hospital  room?: Total Help needed climbing 3-5 steps with a railing? : Total 6 Click Score: 8    End of Session Equipment Utilized During Treatment: Gait belt Activity Tolerance: Patient tolerated treatment well Patient left: in chair;with  call bell/phone within reach;with family/visitor present Nurse Communication: Mobility status;Need for lift equipment (Stedy to return to bed; pt with increased secretions) PT Visit Diagnosis: Other abnormalities of gait and mobility (R26.89);Muscle weakness (generalized) (M62.81)     Time: 2637-8588 PT Time Calculation (min) (ACUTE ONLY): 33 min  Charges:  $Therapeutic Activity: 8-22 mins                     Marquice Uddin P., PTA Acute Rehabilitation Services Secure Chat Preferred 9a-5:30pm Office: [place] 09/27/2021, 3:38 PM

## 2021-09-27 NOTE — Progress Notes (Signed)
ANTICOAGULATION CONSULT NOTE - Follow Up Consult  Pharmacy Consult for heparin - > Lovenox / Warfarin Indication:  history of DVT/PE  Allergies  Allergen Reactions   Ampicillin Other (See Comments)    Made tongue sore Has patient had a PCN reaction causing immediate rash, facial/tongue/throat swelling, SOB or lightheadedness with hypotension: no Has patient had a PCN reaction causing severe rash involving mucus membranes or skin necrosis: no Has patient had a PCN reaction that required hospitalization no Has patient had a PCN reaction occurring within the last 10 years: no If all of the above answers are "NO", then may proceed with Cephalosporin use.    Macrodantin [Nitrofurantoin Macrocrystal] Itching, Swelling and Rash    Patient Measurements: Height: '5\' 10"'$  (177.8 cm) Weight: 77.9 kg (171 lb 11.8 oz) IBW/kg (Calculated) : 73 Heparin Dosing Weight: 77.9 kg (Actual body weight)  Vital Signs: Temp: 97.8 F (36.6 C) (09/22 0304) Temp Source: Oral (09/22 0304) BP: 127/74 (09/22 0304) Pulse Rate: 79 (09/22 0304)  Labs: Recent Labs    09/25/21 0347 09/25/21 0616 09/26/21 0422 09/27/21 0354  HGB  --  13.5  --   --   HCT  --  38.7*  --   --   PLT  --  98*  --   --   LABPROT  --   --  15.3* 16.1*  INR  --   --  1.2 1.3*  HEPARINUNFRC 0.35  --   --   --   CREATININE 1.06  --   --   --     Estimated Creatinine Clearance: 67 mL/min (by C-G formula based on SCr of 1.06 mg/dL).  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 Patient was on warfarin prior to admission and bridging with enoxaparin 80 mg twice daily. Last dose of enoxaparin 9/16  0615.  Back to Lovenox and Warfarin for DVT/PE history INR slowly trending up - now on metronidazole  Goal of Therapy:  INR 2 to 3 Monitor platelets by anticoagulation protocol: Yes   Plan:  Lovenox 80 mg sq Q 12 hours Repeat Warfarin 7.5 mg po x 1 dose tonight Daily INR  Thank you. Anette Guarneri, PharmD 09/27/2021, 8:42 AM

## 2021-09-27 NOTE — Progress Notes (Signed)
Patient placed on home cpap. RT will continue to monitor.

## 2021-09-27 NOTE — TOC Progression Note (Addendum)
Transition of Care [facility]) - Progression Note    Patient Details  Name: Tyler MASTRO MRN: [id number] Date of Birth: 07-03-1950  Transition of Care [facility]) CM/SW [place], LCSW Phone Number: 09/27/2021, 12:55 PM  Clinical Narrative:    12:55 PM CSW spoke with patient's spouse. She stated she could contact CSW back in a little while to discuss plan.   3pm-CSW received call from patient's spouse. She has selected [facility]. They are able to accept patient on Monday. CSW left voicemail for [employer] to see if authorization can be started.    Expected Discharge Plan: Skilled Nursing Facility Barriers to Discharge: Ship broker, Continued Medical Work up, SNF Pending bed offer  Expected Discharge Plan and Services Expected Discharge Plan: [place] Choice: [place] arrangements for the past 2 months: Single Family Home                                       Social Determinants of Health (SDOH) Interventions    Readmission Risk Interventions     No data to display

## 2021-09-28 ENCOUNTER — Inpatient Hospital Stay (HOSPITAL_COMMUNITY): Payer: PPO

## 2021-09-28 DIAGNOSIS — N179 Acute kidney failure, unspecified: Secondary | ICD-10-CM | POA: Diagnosis not present

## 2021-09-28 DIAGNOSIS — F329 Major depressive disorder, single episode, unspecified: Secondary | ICD-10-CM | POA: Diagnosis not present

## 2021-09-28 DIAGNOSIS — F411 Generalized anxiety disorder: Secondary | ICD-10-CM | POA: Diagnosis not present

## 2021-09-28 DIAGNOSIS — G9340 Encephalopathy, unspecified: Secondary | ICD-10-CM | POA: Diagnosis not present

## 2021-09-28 LAB — COMPREHENSIVE METABOLIC PANEL
ALT: 130 U/L — ABNORMAL HIGH (ref 0–44)
AST: 63 U/L — ABNORMAL HIGH (ref 15–41)
Albumin: 2.1 g/dL — ABNORMAL LOW (ref 3.5–5.0)
Alkaline Phosphatase: 124 U/L (ref 38–126)
Anion gap: 5 (ref 5–15)
BUN: 23 mg/dL (ref 8–23)
CO2: 23 mmol/L (ref 22–32)
Calcium: 8.4 mg/dL — ABNORMAL LOW (ref 8.9–10.3)
Chloride: 112 mmol/L — ABNORMAL HIGH (ref 98–111)
Creatinine, Ser: 0.96 mg/dL (ref 0.61–1.24)
GFR, Estimated: >60 mL/min (ref >60)
Glucose, Bld: 95 mg/dL (ref 70–99)
Potassium: 4 mmol/L (ref 3.5–5.1)
Sodium: 140 mmol/L (ref 135–145)
Total Bilirubin: 0.6 mg/dL (ref 0.3–1.2)
Total Protein: 4.5 g/dL — ABNORMAL LOW (ref 6.5–8.1)

## 2021-09-28 LAB — GLUCOSE, CAPILLARY
Glucose-Capillary: 79 mg/dL (ref 70–99)
Glucose-Capillary: 92 mg/dL (ref 70–99)
Glucose-Capillary: 162 mg/dL — ABNORMAL HIGH (ref 70–99)
Glucose-Capillary: 95 mg/dL (ref 70–99)

## 2021-09-28 LAB — PROTIME-INR
INR: 1.5 — ABNORMAL HIGH (ref 0.8–1.2)
Prothrombin Time: 18.1 seconds — ABNORMAL HIGH (ref 11.4–15.2)

## 2021-09-28 MED ORDER — PREDNISONE 20 MG PO TABS
60.0000 mg | ORAL_TABLET | Freq: Every day | ORAL | Status: DC
  Administered 2021-09-29 – 2021-10-01 (×3): 60 mg via ORAL
  Filled 2021-09-28 (×3): qty 3

## 2021-09-28 MED ORDER — PREDNISONE 20 MG PO TABS: 20.0000 mg | ORAL_TABLET | Freq: Every day | ORAL | Status: DC

## 2021-09-28 MED ORDER — SODIUM CHLORIDE 3 % IN NEBU
4.0000 mL | INHALATION_SOLUTION | Freq: Two times a day (BID) | RESPIRATORY_TRACT | Status: AC
  Administered 2021-09-28 – 2021-09-29 (×3): 4 mL via RESPIRATORY_TRACT
  Filled 2021-09-28 (×5): qty 4

## 2021-09-28 MED ORDER — PREDNISONE 20 MG PO TABS: 40.0000 mg | ORAL_TABLET | Freq: Every day | ORAL | Status: DC

## 2021-09-28 MED ORDER — WARFARIN SODIUM 5 MG PO TABS
7.5000 mg | ORAL_TABLET | Freq: Once | ORAL | Status: AC
  Administered 2021-09-28: 7.5 mg via ORAL
  Filled 2021-09-28: qty 1

## 2021-09-28 NOTE — Progress Notes (Signed)
Modified Barium Swallow Progress Note  Patient Details  Name: [name] MRN: [id number] Date of Birth: Jan 08, 1950  Today's Date: 09/28/2021  Modified Barium Swallow completed.  Full report located under Chart Review in the Imaging Section.  Brief recommendations include the following:  Clinical Impression  Pt presented with functional oropharyngeal swallow marked by slowed oral mastication/preparation of thin liquids and purees, no significant delays in swallow initiation, consistent laryngeal vestibule closure with no aspiration (over multiple trials and sequential thin liquid boluses).  He was alert and attentive during study. Video was viewed in real time by pt's wife; we discussed his performance and the benefit of upright positioning.  There was no coughing noted during today's study (in comparison with frequent coughing observed during meals.) Pt remains at risk for aspiration when he is not alert.  Recommend continuing dysphagia 1 diet with thin liquids.  Pt should be seated as upright as possible when eating/drinking.  SLP will follow.    Swallow Evaluation Recommendations       SLP Diet Recommendations: Dysphagia 1 (Puree) solids;Thin liquid   Liquid Administration via: Cup;Straw   Medication Administration: Crushed with puree   Supervision: Staff to assist with self feeding   Compensations: Slow rate;Small sips/bites   Postural Changes: Seated upright at 90 degrees   Oral Care Recommendations: Oral care BID   Other Recommendations: Have oral suction available    Tyler Mason 09/28/2021,3:40 PM  Estill Bamberg L. Tivis Ringer, MA CCC/SLP Clinical Specialist - [place] Office number [phone]
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
## 2021-09-28 NOTE — Progress Notes (Signed)
Speech Language Pathology Treatment: Dysphagia  Patient Details Name: Tyler Mason MRN: [id number] DOB: [birth year] Today's Date: 09/28/2021 Time: 8341-9622 SLP Time Calculation (min) (ACUTE ONLY): 13 min  Assessment / Plan / Recommendation Clinical Impression  Level of swallow function remains consistent with yesterday's performance. There is generalized weakness, possible swallow onset delay, intermittent/congested coughing after swallows of ice chips and thin liquids.  Pt's wife, Tyler Mason, is at bedside. We discussed the plan for MBS today if radiology scheduling permits.  If MBS cannot be completed today, will proceed with bedside FEES this afternoon. Tyler Mason agrees with plan.     HPI HPI: Pt is a 71 yo male presenting 9/15 after 1 week of worsening confusion and weakness, admitted with sepsis of unclear etiology. MRI motion degraded but without acute findings. CXR without acute cardiopulmonary disease. PMH includes: GERD, hearing loss, major neurocognitive disorder, PNA, dementia vs PSP,  TBI (1995), recurrent VTE, stage IV kidney cancer.      SLP Plan  New goals to be determined pending instrumental study;Continue with current plan of care      Recommendations for follow up therapy are one component of a multi-disciplinary discharge planning process, led by the attending physician.  Recommendations may be updated based on patient status, additional functional criteria and insurance authorization.    Recommendations  Diet recommendations: Dysphagia 1 (puree);Thin liquid Liquids provided via: Cup;Teaspoon Medication Administration: Crushed with puree Supervision: Staff to assist with self feeding;Full supervision/cueing for compensatory strategies Compensations: Slow rate;Small sips/bites Postural Changes and/or Swallow Maneuvers: Seated upright 90 degrees;Upright 30-60 min after meal                Oral Care Recommendations: Oral care BID;Staff/trained caregiver to provide oral  care Assistance recommended at discharge: Frequent or constant Supervision/Assistance SLP Visit Diagnosis: Dysphagia, oropharyngeal phase (R13.12);Dysphagia, unspecified (R13.10) Plan: New goals to be determined pending instrumental study;Continue with current plan of care         Dhyan Noah L. Tivis Ringer, MA CCC/SLP Clinical Specialist - Acute Care SLP Acute Rehabilitation Services Office number [phone]   Juan Quam Laurice  09/28/2021, 10:41 AM

## 2021-09-28 NOTE — Progress Notes (Addendum)
CPT on hold due to pt being off floor. RT will attempt at next scheduled time.RT will monitor.

## 2021-09-28 NOTE — TOC Progression Note (Signed)
Transition of Care [facility]) - Progression Note    Patient Details  Name: Tyler Mason MRN: [id number] Date of Birth: Oct 23, 1950  Transition of Care [facility]) CM/SW [place], LCSW Phone Number:[phone] 09/28/2021, 10:38 AM  Clinical Narrative:     CSW received a call from [facility] form HTA with authorization approval. SNF auth# [id number]- good for 5 business days from 09/28/2021 [facility] Bi # [id number]- good for 90 business days.  TOC team will continue to assist with discharge planning needs.    Expected Discharge Plan: Skilled Nursing Facility Barriers to Discharge: Ship broker, Continued Medical Work up, SNF Pending bed offer  Expected Discharge Plan and Services Expected Discharge Plan: [place] Choice: [place] arrangements for the past 2 months: Single Family Home                                       Social Determinants of Health (SDOH) Interventions    Readmission Risk Interventions     No data to display

## 2021-09-28 NOTE — Progress Notes (Signed)
ANTICOAGULATION CONSULT NOTE - Follow Up Consult  Pharmacy Consult for heparin - > Lovenox / Warfarin Indication:  history of DVT/PE  Allergies  Allergen Reactions   Ampicillin Other (See Comments)    Made tongue sore Has patient had a PCN reaction causing immediate rash, facial/tongue/throat swelling, SOB or lightheadedness with hypotension: no Has patient had a PCN reaction causing severe rash involving mucus membranes or skin necrosis: no Has patient had a PCN reaction that required hospitalization no Has patient had a PCN reaction occurring within the last 10 years: no If all of the above answers are "NO", then may proceed with Cephalosporin use.    Macrodantin [Nitrofurantoin Macrocrystal] Itching, Swelling and Rash    Patient Measurements: Height: '5\' 10"'$  (177.8 cm) Weight: 77.9 kg (171 lb 11.8 oz) IBW/kg (Calculated) : 73 Heparin Dosing Weight: 77.9 kg (Actual body weight)  Vital Signs: Temp: 97.1 F (36.2 C) (09/23 0811) Temp Source: Axillary (09/23 0811) BP: 145/77 (09/23 0811) Pulse Rate: 83 (09/23 0811)  Labs: Recent Labs    09/26/21 0422 09/27/21 0354 09/28/21 0334  LABPROT 15.3* 16.1* 18.1*  INR 1.2 1.3* 1.5*  CREATININE  --   --  0.96    Estimated Creatinine Clearance: 73.9 mL/min (by C-G formula based on SCr of 0.96 mg/dL).  Medications:  Infusions:   sodium chloride 50 mL/hr at 09/27/21 2346   cefTRIAXone (ROCEPHIN)  IV Stopped (09/27/21 1419)   Assessment: 72 yoM with PMH of TBI, history of DVT/PE (on warfarin PTA), positive lupus anticoagulant, hyperparathyroidism s/p partial parathyroidectomy, depression and GERD who presented with altered mental status. Patient was on warfarin prior to admission and bridging with enoxaparin 80 mg twice daily. Last dose of warfarin 9/15  Bridging to PTA Warfarin for DVT/PE history PTA warfarin dosing: 5 mg everyday except 7.5 mg Friday, Sunday INR subtherapeutic at 1.5 today but slowly trending up  Major  Interacting medications: Flagyl 9/22 >> 9/23  Plan:  Lovenox 80 mg sq Q 12 hours Repeat Warfarin 7.5 mg po x 1 dose tonight Daily INR  Goal of Therapy:  INR 2 to 3 Monitor platelets by anticoagulation protocol: Yes    Titus Dubin, PharmD PGY1 Pharmacy Resident 09/28/2021 8:57 AM

## 2021-09-28 NOTE — Progress Notes (Signed)
Physical Therapy Treatment Patient Details Name: Tyler Mason MRN: [id number] DOB: 01/23/1950 Today's Date: 09/28/2021   History of Present Illness 71 yo male presenting 9/15 after 1 week of worsening confusion and weakness, admitted with sepsis of unclear etiology. MRI motion degraded but without acute findings. CXR without acute cardiopulmonary disease. Bedside LP 9/18 unsuccessful, plan for reattempt in radiology next date. PMH includes: GERD, hearing loss, major neurocognitive disorder, PNA, TBI (1995), recurrent VTE, stage IV kidney ca.    PT Comments    Pt making steady progress with all mobility. Improved transfers and beginning gait training.    Recommendations for follow up therapy are one component of a multi-disciplinary discharge planning process, led by the attending physician.  Recommendations may be updated based on patient status, additional functional criteria and insurance authorization.  Follow Up Recommendations  Skilled nursing-short term rehab (<3 hours/day) Can patient physically be transported by private vehicle: No   Assistance Recommended at Discharge Frequent or constant Supervision/Assistance  Patient can return home with the following A lot of help with bathing/dressing/bathroom;Assist for transportation;Help with stairs or ramp for entrance;Two people to help with walking and/or transfers;Direct supervision/assist for medications management;Assistance with feeding   Equipment Recommendations  None recommended by PT (TBD)    Recommendations for Other Services       Precautions / Restrictions Precautions Precautions: Fall Precaution Comments: rectal tube Restrictions Weight Bearing Restrictions: No     Mobility  Bed Mobility Overal bed mobility: Needs Assistance Bed Mobility: Supine to Sit, Sit to Supine     Supine to sit: Mod assist, HOB elevated, +2 for safety/equipment Sit to supine: +2 for physical assistance, Max assist   General bed  mobility comments: Assist to bring legs off, elevate trunk into sitting and bring hips to EOB. Assist to lower trunk and bring legs up into bed.    Transfers Overall transfer level: Needs assistance Equipment used: Rolling walker (2 wheels), Ambulation equipment used Transfers: Sit to/from Stand Sit to Stand: Mod assist, +2 physical assistance, From elevated surface, Min assist           General transfer comment: Assist to bring hips up and for balance. Standing from bed with Stedy or walker requires +2 mod assist. To stand from elevated seat of Stedy pt requires min assist    Ambulation/Gait Ambulation/Gait assistance: +2 physical assistance, Max assist Gait Distance (Feet): 3 Feet Assistive device: Rolling walker (2 wheels) Gait Pattern/deviations: Step-to pattern, Knee hyperextension - right, Trunk flexed, Leaning posteriorly Gait velocity: decr Gait velocity interpretation: <1.31 ft/sec, indicative of household ambulator   General Gait Details: Assist to initiate and complete steps   Stairs             Wheelchair Mobility    Modified Rankin (Stroke Patients Only)       Balance Overall balance assessment: Needs assistance Sitting-balance support: Single extremity supported, Feet unsupported Sitting balance-Leahy Scale: Poor Sitting balance - Comments: Min to min guard for static sitting Postural control: Posterior lean Standing balance support: Bilateral upper extremity supported, During functional activity Standing balance-Leahy Scale: Poor Standing balance comment: Min assist for static standing with walker and +1/+2 mod assist for static standing with walker                            Cognition Arousal/Alertness: Awake/alert Behavior During Therapy: Flat affect Overall Cognitive Status: Impaired/Different from baseline Area of Impairment: Attention, Following commands, Safety/judgement, Problem solving, Awareness  Current Attention Level: Sustained   Following Commands: Follows one step commands inconsistently, Follows one step commands with increased time Safety/Judgement: Decreased awareness of deficits, Decreased awareness of safety Awareness: Intellectual Problem Solving: Slow processing, Requires verbal cues, Requires tactile cues, Difficulty sequencing, Decreased initiation General Comments: Minimally verbal. Incr time for responses        Exercises      General Comments        Pertinent Vitals/Pain Pain Assessment Pain Assessment: Faces Faces Pain Scale: No hurt    Home Living                          Prior Function            PT Goals (current goals can now be found in the care plan section) Acute Rehab PT Goals Patient Stated Goal: not stated Progress towards PT goals: Progressing toward goals    Frequency    Min 3X/week      PT Plan Current plan remains appropriate    Co-evaluation              AM-PAC PT "6 Clicks" Mobility   Outcome Measure  Help needed turning from your back to your side while in a flat bed without using bedrails?: A Lot Help needed moving from lying on your back to sitting on the side of a flat bed without using bedrails?: A Lot Help needed moving to and from a bed to a chair (including a wheelchair)?: Total Help needed standing up from a chair using your arms (e.g., wheelchair or bedside chair)?: Total Help needed to walk in hospital room?: Total Help needed climbing 3-5 steps with a railing? : Total 6 Click Score: 8    End of Session Equipment Utilized During Treatment: Gait belt Activity Tolerance: Patient tolerated treatment well Patient left: with call bell/phone within reach;with family/visitor present;in bed;with bed alarm set Nurse Communication: Mobility status;Need for lift equipment PT Visit Diagnosis: Other abnormalities of gait and mobility (R26.89);Muscle weakness (generalized) (M62.81)     Time:  9935-7017 PT Time Calculation (min) (ACUTE ONLY): 38 min  Charges:  $Therapeutic Activity: 38-52 mins                     [place] 09/28/2021, 5:52 PM

## 2021-09-29 DIAGNOSIS — R6510 Systemic inflammatory response syndrome (SIRS) of non-infectious origin without acute organ dysfunction: Secondary | ICD-10-CM | POA: Diagnosis not present

## 2021-09-29 LAB — FUNGUS CULTURE, BLOOD
Culture: NO GROWTH
Special Requests: ADEQUATE

## 2021-09-29 LAB — COMPREHENSIVE METABOLIC PANEL
ALT: 108 U/L — ABNORMAL HIGH (ref 0–44)
AST: 62 U/L — ABNORMAL HIGH (ref 15–41)
Albumin: 2.2 g/dL — ABNORMAL LOW (ref 3.5–5.0)
Alkaline Phosphatase: 120 U/L (ref 38–126)
Anion gap: 4 — ABNORMAL LOW (ref 5–15)
BUN: 24 mg/dL — ABNORMAL HIGH (ref 8–23)
CO2: 25 mmol/L (ref 22–32)
Calcium: 8.2 mg/dL — ABNORMAL LOW (ref 8.9–10.3)
Chloride: 108 mmol/L (ref 98–111)
Creatinine, Ser: 1.15 mg/dL (ref 0.61–1.24)
GFR, Estimated: >60 mL/min (ref >60)
Glucose, Bld: 84 mg/dL (ref 70–99)
Potassium: 4 mmol/L (ref 3.5–5.1)
Sodium: 137 mmol/L (ref 135–145)
Total Bilirubin: 0.7 mg/dL (ref 0.3–1.2)
Total Protein: 4.9 g/dL — ABNORMAL LOW (ref 6.5–8.1)

## 2021-09-29 LAB — GLUCOSE, CAPILLARY
Glucose-Capillary: 76 mg/dL (ref 70–99)
Glucose-Capillary: 90 mg/dL (ref 70–99)
Glucose-Capillary: 123 mg/dL — ABNORMAL HIGH (ref 70–99)
Glucose-Capillary: 128 mg/dL — ABNORMAL HIGH (ref 70–99)

## 2021-09-29 LAB — CBC
HCT: 38.8 % — ABNORMAL LOW (ref 39.0–52.0)
Hemoglobin: 13.4 g/dL (ref 13.0–17.0)
MCH: 30.2 pg (ref 26.0–34.0)
MCHC: 34.5 g/dL (ref 30.0–36.0)
MCV: 87.4 fL (ref 80.0–100.0)
Platelets: 134 10*3/uL — ABNORMAL LOW (ref 150–400)
RBC: 4.44 MIL/uL (ref 4.22–5.81)
RDW: 17.8 % — ABNORMAL HIGH (ref 11.5–15.5)
WBC: 10.4 10*3/uL (ref 4.0–10.5)
nRBC: 0.3 % — ABNORMAL HIGH (ref 0.0–0.2)

## 2021-09-29 LAB — PROTIME-INR
INR: 1.9 — ABNORMAL HIGH (ref 0.8–1.2)
Prothrombin Time: 21.5 seconds — ABNORMAL HIGH (ref 11.4–15.2)

## 2021-09-29 MED ORDER — WARFARIN SODIUM 5 MG PO TABS
5.0000 mg | ORAL_TABLET | Freq: Once | ORAL | Status: AC
  Administered 2021-09-29: 5 mg via ORAL
  Filled 2021-09-29: qty 1

## 2021-09-29 NOTE — Progress Notes (Signed)
Patient has been stable throughout the shift. Vital signs have been stable. Patient's spouse and/or son has been at bedside throughout shift and very helpful and support. Patient has a complete bed bath together, linen, gown, and dressing changes today and tolerated well. Patient was up to chair for approx. 3 hours and tolerated well, positioning assistance x2 and using steady stander for support. Frequent rounding on patient throughout shift. BG check and stable. Patient is on 4L [place]. RT has been following patient during shift for CPT and suctioning. Patient has had diminished intake, but has been encouraged by loved ones (see intake). Adequate UOP during shift that has been amber. At evening change of shift, while doing bedside report patient's son endorsed that patient has just had a dark red urine - obtained a set of vital signs and stable, patient does not seem to be bleeding from any of orifices or from PICC site. No change is mentation or any indication of pain/discomfort. Endorsed to oncoming shift RN Delcie Roch to continue to follow up and discussed with MD.

## 2021-09-29 NOTE — Progress Notes (Signed)
Placed patient on home CPAP, previous settings. Patient was pulling at mask. Placed back on 3L [place].
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
## 2021-09-29 NOTE — Progress Notes (Signed)
ANTICOAGULATION CONSULT NOTE - Follow Up Consult  Pharmacy Consult for heparin - > Lovenox / Warfarin Indication:  history of DVT/PE  Allergies  Allergen Reactions   Ampicillin Other (See Comments)    Made tongue sore Has patient had a PCN reaction causing immediate rash, facial/tongue/throat swelling, SOB or lightheadedness with hypotension: no Has patient had a PCN reaction causing severe rash involving mucus membranes or skin necrosis: no Has patient had a PCN reaction that required hospitalization no Has patient had a PCN reaction occurring within the last 10 years: no If all of the above answers are "NO", then may proceed with Cephalosporin use.    Macrodantin [Nitrofurantoin Macrocrystal] Itching, Swelling and Rash    Patient Measurements: Height: '5\' 10"'$  (177.8 cm) Weight: 77.9 kg (171 lb 11.8 oz) IBW/kg (Calculated) : 73 Heparin Dosing Weight: 77.9 kg (Actual body weight)  Vital Signs: Temp: 98.9 F (37.2 C) (09/24 0319) Temp Source: Axillary (09/24 0319) BP: 153/77 (09/24 0417) Pulse Rate: 85 (09/24 0417)  Labs: Recent Labs    09/27/21 0354 09/28/21 0334 09/29/21 0333  HGB  --   --  13.4  HCT  --   --  38.8*  PLT  --   --  134*  LABPROT 16.1* 18.1* 21.5*  INR 1.3* 1.5* 1.9*  CREATININE  --  0.96 1.15    Estimated Creatinine Clearance: 61.7 mL/min (by C-G formula based on SCr of 1.15 mg/dL).  Medications:  Infusions:   sodium chloride 10 mL/hr at 09/28/21 1800   cefTRIAXone (ROCEPHIN)  IV Stopped (09/28/21 1415)   Assessment: 38 yoM with PMH of TBI, history of DVT/PE (on warfarin PTA), positive lupus anticoagulant, hyperparathyroidism s/p partial parathyroidectomy, depression and GERD who presented with altered mental status. Patient was on warfarin prior to admission and bridging with enoxaparin 80 mg twice daily. Last dose of warfarin 9/15  Bridging to PTA Warfarin for DVT/PE history PTA warfarin dosing: 5 mg everyday except 7.5 mg Friday, Sunday INR  subtherapeutic at 1.9 today but trending up quickly now that flagyl has been discontinued. CBC stable, no s/s of clotting or bleeding noted  Major Interacting medications: Flagyl 9/22 >> 9/23  Plan:  Lovenox 80 mg sq Q 12 hours Warfarin 5 mg po x 1 dose tonight Daily INR  Goal of Therapy:  INR 2 to 3 Monitor platelets by anticoagulation protocol: Yes    Titus Dubin, PharmD PGY1 Pharmacy Resident 09/29/2021 7:22 AM

## 2021-09-30 ENCOUNTER — Inpatient Hospital Stay (HOSPITAL_COMMUNITY): Payer: PPO

## 2021-09-30 DIAGNOSIS — R6510 Systemic inflammatory response syndrome (SIRS) of non-infectious origin without acute organ dysfunction: Secondary | ICD-10-CM | POA: Diagnosis not present

## 2021-09-30 LAB — GLUCOSE, CAPILLARY
Glucose-Capillary: 103 mg/dL — ABNORMAL HIGH (ref 70–99)
Glucose-Capillary: 105 mg/dL — ABNORMAL HIGH (ref 70–99)
Glucose-Capillary: 135 mg/dL — ABNORMAL HIGH (ref 70–99)
Glucose-Capillary: 175 mg/dL — ABNORMAL HIGH (ref 70–99)
Glucose-Capillary: 96 mg/dL (ref 70–99)

## 2021-09-30 LAB — PROTIME-INR
INR: 1.9 — ABNORMAL HIGH (ref 0.8–1.2)
Prothrombin Time: 22 seconds — ABNORMAL HIGH (ref 11.4–15.2)

## 2021-09-30 LAB — URINALYSIS, ROUTINE W REFLEX MICROSCOPIC
Bilirubin Urine: NEGATIVE
Glucose, UA: NEGATIVE mg/dL
Ketones, ur: NEGATIVE mg/dL
Leukocytes,Ua: NEGATIVE
Nitrite: NEGATIVE
Protein, ur: NEGATIVE mg/dL
Specific Gravity, Urine: 1.017 (ref 1.005–1.030)
pH: 7 (ref 5.0–8.0)

## 2021-09-30 MED ORDER — WARFARIN - PHARMACIST DOSING INPATIENT: Freq: Every day | Status: DC

## 2021-09-30 MED ORDER — WARFARIN SODIUM 5 MG PO TABS
7.5000 mg | ORAL_TABLET | Freq: Once | ORAL | Status: AC
  Administered 2021-09-30: 7.5 mg via ORAL
  Filled 2021-09-30: qty 1

## 2021-09-30 NOTE — Progress Notes (Signed)
Inpatient Rehab Admissions Coordinator:    I spoke with pt.'s wife regarding HTA denial for CIR. She did not file an appeal and is pursuing SNF. CIR will sign off.   Clemens Catholic, [place], [place] Admissions Coordinator  [phone] ([place]) [phone] (office)

## 2021-09-30 NOTE — Progress Notes (Signed)
Speech Language Pathology Treatment: Dysphagia  Patient Details Name: Tyler Mason MRN: [id number] DOB: July 31, 1950 Today's Date: 09/30/2021 Time: 0950-1000 SLP Time Calculation (min) (ACUTE ONLY): 10 min  Assessment / Plan / Recommendation Clinical Impression  F/u after 9/23 MBS.  Overall swallowing continues to wax and wane depending on mental status.  MBS showed slow but functional mastication, no significant delays or weakness, no penetration nor aspiration. Per Mrs. Mom, pt continues to cough intermittently when eating.  She and staff are following precautions to maximize safety. Today, overall he had very few clinical s/s of overt aspiration.  Given his delays with the oral phase of the swallow, recommend continuing current diet of dysphagia 1/thins and holding POs when he is not sufficiently alert. SLP will continue to follow while admitted.   HPI HPI: Pt is a 71 yo male presenting 9/15 after 1 week of worsening confusion and weakness, admitted with sepsis of unclear etiology. MRI motion degraded but without acute findings. CXR without acute cardiopulmonary disease. PMH includes: GERD, hearing loss, major neurocognitive disorder, PNA, dementia vs PSP,  TBI (1995), recurrent VTE, stage IV kidney cancer.      SLP Plan  Continue with current plan of care      Recommendations for follow up therapy are one component of a multi-disciplinary discharge planning process, led by the attending physician.  Recommendations may be updated based on patient status, additional functional criteria and insurance authorization.    Recommendations  Diet recommendations: Dysphagia 1 (puree);Thin liquid Liquids provided via: Straw;Cup Medication Administration: Crushed with puree Supervision: Staff to assist with self feeding;Full supervision/cueing for compensatory strategies Compensations: Slow rate;Small sips/bites Postural Changes and/or Swallow Maneuvers: Seated upright 90 degrees;Upright 30-60  min after meal                Oral Care Recommendations: Oral care BID Follow Up Recommendations: Skilled nursing-short term rehab (<3 hours/day) Assistance recommended at discharge: Frequent or constant Supervision/Assistance SLP Visit Diagnosis: Dysphagia, oropharyngeal phase (R13.12);Dysphagia, unspecified (R13.10) Plan: Continue with current plan of care         Tyler Mason L. Tyler Ringer, MA CCC/SLP Clinical Specialist - Acute Care SLP Acute Rehabilitation Services Office number [phone]   Tyler Mason  09/30/2021, 3:24 PM

## 2021-09-30 NOTE — Progress Notes (Signed)
Pt placed on his home CPAP machine. RN made aware

## 2021-09-30 NOTE — Progress Notes (Signed)
Physical Therapy Treatment Patient Details Name: Tyler Mason MRN: [id number] DOB: 1950/04/20 Today's Date: 09/30/2021   History of Present Illness 71 yo male presenting 9/15 after 1 week of worsening confusion and weakness, admitted with sepsis of unclear etiology. MRI motion degraded but without acute findings. CXR without acute cardiopulmonary disease. Bedside LP 9/18 unsuccessful, plan for reattempt in radiology next date. PMH includes: GERD, hearing loss, major neurocognitive disorder, PNA, TBI (1995), recurrent VTE, stage IV kidney ca.    PT Comments    Pt up in chair upon PT arrival to room, requires mod verbal encouragement from PT and pt wife to participate in PT session focused on short-distance gait training, repeated transfer training, and neuromuscular re-ed. Pt tolerated x3 stands with mod-max +2 assist, and requires significant cuing and physical assist to correct heavy posterior bias in both sitting and standing. Pt fatigues quickly, and requires encouragement throughout session to maintain participation. PT to continue to follow acutely.    Recommendations for follow up therapy are one component of a multi-disciplinary discharge planning process, led by the attending physician.  Recommendations may be updated based on patient status, additional functional criteria and insurance authorization.  Follow Up Recommendations  Skilled nursing-short term rehab (<3 hours/day) Can patient physically be transported by private vehicle: No   Assistance Recommended at Discharge Frequent or constant Supervision/Assistance  Patient can return home with the following A lot of help with bathing/dressing/bathroom;Assist for transportation;Help with stairs or ramp for entrance;Two people to help with walking and/or transfers;Direct supervision/assist for medications management;Assistance with feeding   Equipment Recommendations  None recommended by PT (TBD)    Recommendations for Other  Services       Precautions / Restrictions Precautions Precautions: Fall Precaution Comments: rectal tube Restrictions Weight Bearing Restrictions: No     Mobility  Bed Mobility               General bed mobility comments: up in chair    Transfers Overall transfer level: Needs assistance Equipment used: Rolling walker (2 wheels) Transfers: Sit to/from Stand Sit to Stand: Max assist, +2 physical assistance           General transfer comment: max +2 for power up, rise, truncal translation anteriorly as pt with posterior bias, and steadying. STS x3 from recliner, cues for widening BOS, tucking hips ie performing posterior pelvic tilt, and maintaining stand for as long as tolerated as pt attempts to quickly sit back down    Ambulation/Gait Ambulation/Gait assistance: +2 physical assistance, Max assist Gait Distance (Feet): 4 Feet Assistive device: Rolling walker (2 wheels) Gait Pattern/deviations: Trunk flexed, Leaning posteriorly, Step-through pattern, Shuffle Gait velocity: decr     General Gait Details: requires max support for weight shifting, LE progression, correcting posterior bias   Stairs             Wheelchair Mobility    Modified Rankin (Stroke Patients Only)       Balance Overall balance assessment: Needs assistance Sitting-balance support: Single extremity supported, Feet unsupported Sitting balance-Leahy Scale: Poor Sitting balance - Comments: posterior bias, requires truncal support Postural control: Posterior lean Standing balance support: Bilateral upper extremity supported, During functional activity Standing balance-Leahy Scale: Poor Standing balance comment: assist to correct posterior bias                            Cognition Arousal/Alertness: Awake/alert Behavior During Therapy: Flat affect Overall Cognitive Status: Impaired/Different from baseline Area of  Impairment: Attention, Following commands,  Safety/judgement, Problem solving, Awareness                   Current Attention Level: Sustained   Following Commands: Follows one step commands inconsistently, Follows one step commands with increased time Safety/Judgement: Decreased awareness of deficits, Decreased awareness of safety Awareness: Intellectual Problem Solving: Slow processing, Requires verbal cues, Requires tactile cues, Difficulty sequencing, Decreased initiation General Comments: pt verbalizing some during session, mostly to say he does not want to walk or participate in PT. Pt agreeable with wife and PT continued encouragement. Pt with very slowed processing and inconsistently follows one-step commands, PT thinks this is due to pt attention span        Exercises      General Comments        Pertinent Vitals/Pain Pain Assessment Pain Assessment: Faces Faces Pain Scale: Hurts a little bit Pain Location: generalized during mobility Pain Descriptors / Indicators: Grimacing Pain Intervention(s): Limited activity within patient's tolerance, Monitored during session, Repositioned    Home Living                          Prior Function            PT Goals (current goals can now be found in the care plan section) Acute Rehab PT Goals Patient Stated Goal: not stated PT Goal Formulation: With patient Time For Goal Achievement: 10/05/21 Potential to Achieve Goals: Fair Progress towards PT goals: Progressing toward goals    Frequency    Min 3X/week      PT Plan Current plan remains appropriate    Co-evaluation              AM-PAC PT "6 Clicks" Mobility   Outcome Measure  Help needed turning from your back to your side while in a flat bed without using bedrails?: A Lot Help needed moving from lying on your back to sitting on the side of a flat bed without using bedrails?: A Lot Help needed moving to and from a bed to a chair (including a wheelchair)?: Total Help needed standing  up from a chair using your arms (e.g., wheelchair or bedside chair)?: Total Help needed to walk in hospital room?: Total Help needed climbing 3-5 steps with a railing? : Total 6 Click Score: 8    End of Session Equipment Utilized During Treatment: Gait belt;Oxygen Activity Tolerance: Patient limited by fatigue Patient left: with call bell/phone within reach;with family/visitor present;in chair;with chair alarm set Nurse Communication: Mobility status PT Visit Diagnosis: Other abnormalities of gait and mobility (R26.89);Muscle weakness (generalized) (M62.81)     Time: 2641-5830 PT Time Calculation (min) (ACUTE ONLY): 24 min  Charges:  $Therapeutic Activity: 8-22 mins $Neuromuscular Re-education: 8-22 mins                     Stacie Glaze, PT DPT Acute Rehabilitation Services Pager [phone]  Office [phone]   Roxine Caddy E Ruffin Pyo 09/30/2021, 3:16 PM

## 2021-09-30 NOTE — Care Management Important Message (Signed)
Important Message  Patient Details  Name: Tyler Mason MRN: [id number] Date of Birth: 08/04/1950   Medicare Important Message Given:  Yes     Orbie Pyo 09/30/2021, 2:49 PM

## 2021-09-30 NOTE — Progress Notes (Signed)
ANTICOAGULATION CONSULT NOTE - Follow Up Consult  Pharmacy Consult for heparin - > Lovenox / Warfarin Indication:  history of DVT/PE  Allergies  Allergen Reactions   Ampicillin Other (See Comments)    Made tongue sore Has patient had a PCN reaction causing immediate rash, facial/tongue/throat swelling, SOB or lightheadedness with hypotension: no Has patient had a PCN reaction causing severe rash involving mucus membranes or skin necrosis: no Has patient had a PCN reaction that required hospitalization no Has patient had a PCN reaction occurring within the last 10 years: no If all of the above answers are "NO", then may proceed with Cephalosporin use.    Macrodantin [Nitrofurantoin Macrocrystal] Itching, Swelling and Rash    Patient Measurements: Height: '5\' 10"'$  (177.8 cm) Weight: 77.9 kg (171 lb 11.8 oz) IBW/kg (Calculated) : 73 Heparin Dosing Weight: 77.9 kg (Actual body weight)  Vital Signs: Temp: 97.7 F (36.5 C) (09/25 0400) Temp Source: Oral (09/25 0400) BP: 122/75 (09/25 0400) Pulse Rate: 76 (09/25 0400)  Labs: Recent Labs    09/28/21 0334 09/29/21 0333 09/30/21 0258  HGB  --  13.4  --   HCT  --  38.8*  --   PLT  --  134*  --   LABPROT 18.1* 21.5* 22.0*  INR 1.5* 1.9* 1.9*  CREATININE 0.96 1.15  --     Estimated Creatinine Clearance: 61.7 mL/min (by C-G formula based on SCr of 1.15 mg/dL).  Medications:  Infusions:   sodium chloride 10 mL/hr at 09/28/21 1800   cefTRIAXone (ROCEPHIN)  IV 200 mL/hr at 09/29/21 1622   Assessment: 66 yoM with PMH of TBI, history of DVT/PE (on warfarin PTA), positive lupus anticoagulant, hyperparathyroidism s/p partial parathyroidectomy, depression and GERD who presented with altered mental status. Patient was on warfarin prior to admission and bridging with enoxaparin 80 mg twice daily. Last dose of warfarin 9/15  Bridging to PTA Warfarin for DVT/PE history PTA warfarin dosing: 5 mg everyday except 7.5 mg Friday, Sunday INR  still subtherapeutic at 1.9 today. CBC stable, no s/s of clotting or bleeding noted  Consulted for DOAC, but due to patient's h/o Lupus anticoagulant will hold off for now per primary MD. Continue warfarin and lovenox for now.   Major Interacting medications: Flagyl 9/22 >> 9/23  Plan:  Lovenox 80 mg sq Q 12 hours Warfarin 7.5 mg po x 1 dose tonight Daily INR  Goal of Therapy:  INR 2 to 3 Monitor platelets by anticoagulation protocol: Yes    Kyre Jeffries A. Levada Dy, PharmD, BCPS, FNKF Clinical Pharmacist [place] Please utilize Amion for appropriate phone number to reach the unit pharmacist ([place])  09/30/2021 8:22 AM
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
## 2021-10-01 ENCOUNTER — Inpatient Hospital Stay (HOSPITAL_COMMUNITY): Payer: PPO

## 2021-10-01 DIAGNOSIS — J9601 Acute respiratory failure with hypoxia: Secondary | ICD-10-CM | POA: Diagnosis not present

## 2021-10-01 DIAGNOSIS — Z515 Encounter for palliative care: Secondary | ICD-10-CM | POA: Diagnosis not present

## 2021-10-01 DIAGNOSIS — C641 Malignant neoplasm of right kidney, except renal pelvis: Secondary | ICD-10-CM | POA: Diagnosis not present

## 2021-10-01 DIAGNOSIS — J181 Lobar pneumonia, unspecified organism: Secondary | ICD-10-CM | POA: Diagnosis not present

## 2021-10-01 DIAGNOSIS — C649 Malignant neoplasm of unspecified kidney, except renal pelvis: Secondary | ICD-10-CM

## 2021-10-01 DIAGNOSIS — J189 Pneumonia, unspecified organism: Secondary | ICD-10-CM

## 2021-10-01 DIAGNOSIS — Z66 Do not resuscitate: Secondary | ICD-10-CM

## 2021-10-01 DIAGNOSIS — R6510 Systemic inflammatory response syndrome (SIRS) of non-infectious origin without acute organ dysfunction: Secondary | ICD-10-CM | POA: Diagnosis not present

## 2021-10-01 DIAGNOSIS — D849 Immunodeficiency, unspecified: Secondary | ICD-10-CM

## 2021-10-01 DIAGNOSIS — Z7189 Other specified counseling: Secondary | ICD-10-CM

## 2021-10-01 LAB — CBC WITH DIFFERENTIAL/PLATELET
Abs Immature Granulocytes: 0.64 10*3/uL — ABNORMAL HIGH (ref 0.00–0.07)
Basophils Absolute: 0.1 10*3/uL (ref 0.0–0.1)
Basophils Relative: 1 %
Eosinophils Absolute: 0 10*3/uL (ref 0.0–0.5)
Eosinophils Relative: 0 %
HCT: 38.5 % — ABNORMAL LOW (ref 39.0–52.0)
Hemoglobin: 12.8 g/dL — ABNORMAL LOW (ref 13.0–17.0)
Immature Granulocytes: 5 %
Lymphocytes Relative: 12 %
Lymphs Abs: 1.5 10*3/uL (ref 0.7–4.0)
MCH: 29.4 pg (ref 26.0–34.0)
MCHC: 33.2 g/dL (ref 30.0–36.0)
MCV: 88.5 fL (ref 80.0–100.0)
Monocytes Absolute: 1.3 10*3/uL — ABNORMAL HIGH (ref 0.1–1.0)
Monocytes Relative: 11 %
Neutro Abs: 8.5 10*3/uL — ABNORMAL HIGH (ref 1.7–7.7)
Neutrophils Relative %: 71 %
Platelets: 142 10*3/uL — ABNORMAL LOW (ref 150–400)
RBC: 4.35 MIL/uL (ref 4.22–5.81)
RDW: 17.9 % — ABNORMAL HIGH (ref 11.5–15.5)
WBC: 12 10*3/uL — ABNORMAL HIGH (ref 4.0–10.5)
nRBC: 0.3 % — ABNORMAL HIGH (ref 0.0–0.2)

## 2021-10-01 LAB — URINE CULTURE: Culture: NO GROWTH

## 2021-10-01 LAB — BASIC METABOLIC PANEL
Anion gap: 7 (ref 5–15)
BUN: 29 mg/dL — ABNORMAL HIGH (ref 8–23)
CO2: 25 mmol/L (ref 22–32)
Calcium: 8.8 mg/dL — ABNORMAL LOW (ref 8.9–10.3)
Chloride: 107 mmol/L (ref 98–111)
Creatinine, Ser: 1.12 mg/dL (ref 0.61–1.24)
GFR, Estimated: >60 mL/min (ref >60)
Glucose, Bld: 85 mg/dL (ref 70–99)
Potassium: 4.2 mmol/L (ref 3.5–5.1)
Sodium: 139 mmol/L (ref 135–145)

## 2021-10-01 LAB — MAGNESIUM: Magnesium: 2.4 mg/dL (ref 1.7–2.4)

## 2021-10-01 LAB — PHOSPHORUS: Phosphorus: 3.3 mg/dL (ref 2.5–4.6)

## 2021-10-01 MED ORDER — POLYVINYL ALCOHOL 1.4 % OP SOLN
1.0000 [drp] | Freq: Four times a day (QID) | OPHTHALMIC | Status: DC | PRN
  Filled 2021-10-01: qty 15

## 2021-10-01 MED ORDER — HALOPERIDOL LACTATE 5 MG/ML IJ SOLN: 2.5000 mg | INTRAMUSCULAR | Status: DC | PRN

## 2021-10-01 MED ORDER — LORAZEPAM 2 MG/ML IJ SOLN
2.0000 mg | INTRAMUSCULAR | Status: DC | PRN
  Administered 2021-10-01 – 2021-10-02 (×3): 2 mg via INTRAVENOUS
  Filled 2021-10-01 (×3): qty 1

## 2021-10-01 MED ORDER — SODIUM CHLORIDE 0.9 % IV SOLN
2.0000 g | INTRAVENOUS | Status: DC
  Administered 2021-10-01: 2 g via INTRAVENOUS
  Filled 2021-10-01: qty 20

## 2021-10-01 MED ORDER — MORPHINE SULFATE (PF) 2 MG/ML IV SOLN: 2.0000 mg | INTRAVENOUS | Status: DC | PRN
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
## 2021-10-01 NOTE — IPAL (Signed)
  Interdisciplinary Goals of Care Family Meeting   Date carried out: 10/01/2021  Location of the meeting: Bedside  Member's involved: Nurse Practitioner, Respiratory Therapist, and Family Member or next of kin  Durable Power of Attorney or acting medical decision maker: Wife, Tyler Mason    Discussion: We discussed goals of care for Tyler Mason .  Discussed Tyler Mason's journey with cancer, as well as his progressive decline. We talked about various paths in medical treatment from here, including very aggressive/invasive measures vs symptom focussed interventions. I shared that I worry measures such as mechanical ventilation for airway protection, ACLS in event of arrest, etc would not provide long term benefit as the underlying cause of what would cause Tyler Mason to need such measures would not be reversed (I.e his debility and poor airway protection would not be resolved by an ETT, in fact he would likely grow more debilitated).   A decision was reached to shift medical efforts toward palliative care -- focusing on symptom management as we approach the end of life. Tyler Mason (Tyler Mason's wife) is interested in home hospice if this is an option (vs residential).  Code status will change to DNR.  Palliative care to be consulted.   Dc labs Dc PT/OT Pleasure feeds  PRN comfort meds Ok to cont abx for now as dispo is planned    Conveyed to primary team   Code status: Full DNR  Disposition: Home with Hospice with transition to comfort care now   Time spent for the meeting: 36 min   Eliseo Gum MSN, AGACNP-BC [place] for pager  10/01/2021, 9:20 AM

## 2021-10-01 NOTE — Progress Notes (Signed)
   This pt was referred for hospice services at home. The chart has been reviewed. Family has been consulted and are in agreement with hospice services and focusing on comfort care at home. Our MD has approved the pt for hospice services at home. The pt will need some equipment needs. I have ordered hospital bed, Over bed table and oxygen for delivery before 1200pm tomorrow morning.  We will be able to see pt after going home and enroll into hospice care.   Webb Silversmith RN [phone]
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
## 2021-10-02 ENCOUNTER — Other Ambulatory Visit (HOSPITAL_COMMUNITY): Payer: Self-pay

## 2021-10-02 DIAGNOSIS — R6510 Systemic inflammatory response syndrome (SIRS) of non-infectious origin without acute organ dysfunction: Secondary | ICD-10-CM | POA: Diagnosis not present

## 2021-10-02 MED ORDER — ALBUTEROL SULFATE HFA 108 (90 BASE) MCG/ACT IN AERS
2.0000 | INHALATION_SPRAY | Freq: Four times a day (QID) | RESPIRATORY_TRACT | 0 refills | Status: AC | PRN
  Filled 2021-10-02: qty 6.7, 15d supply, fill #0

## 2021-10-02 MED ORDER — MORPHINE SULFATE (CONCENTRATE) 10 MG/0.5ML PO SOLN
5.0000 mg | ORAL | 0 refills | Status: AC | PRN
  Filled 2021-10-02: qty 30, 7d supply, fill #0

## 2021-10-02 MED ORDER — LORAZEPAM 2 MG/ML IJ SOLN
2.0000 mg | INTRAMUSCULAR | Status: DC | PRN
  Administered 2021-10-02: 2 mg via INTRAVENOUS
  Filled 2021-10-02: qty 1

## 2021-10-02 MED ORDER — MORPHINE SULFATE (CONCENTRATE) 10 MG/0.5ML PO SOLN: 5.0000 mg | ORAL | Status: DC | PRN

## 2021-10-02 MED ORDER — LORAZEPAM 1 MG PO TABS: 2.0000 mg | ORAL_TABLET | ORAL | Status: DC | PRN

## 2021-10-02 MED ORDER — LORAZEPAM 1 MG PO TABS
2.0000 mg | ORAL_TABLET | ORAL | 0 refills | Status: AC | PRN
  Filled 2021-10-02: qty 30, 6d supply, fill #0

## 2021-10-02 NOTE — Discharge Instructions (Signed)
Disposition.  Residential hospice °Condition.  Guarded °CODE STATUS.  DNR °Activity.  With assistance as tolerated, full fall precautions. °Diet.  Soft with feeding assistance and aspiration precautions. °Goal of care.  Comfort. ° °

## 2021-10-02 NOTE — Plan of Care (Signed)
  Problem: Role Relationship: Goal: Family's ability to cope with current situation will improve Outcome: Progressing Goal: Ability to verbalize concerns, feelings, and thoughts to partner or family member will improve Outcome: Progressing   Problem: Pain Management: Goal: Satisfaction with pain management regimen will improve Outcome: Progressing   

## 2021-10-02 NOTE — Progress Notes (Signed)
Palliative:  Plans for home with hospice today in motion. PRN comfort medication recommendations in chart. Discussed with wife and attending.   No charge  Vinie Sill, NP Palliative Medicine Team Pager [phone] (Please see [email or web address] for schedule) Team Phone [phone]

## 2021-10-02 NOTE — TOC Transition Note (Signed)
Transition of Care [facility]) - CM/SW Discharge Note   Patient Details  Name: Tyler Mason MRN: [id number] Date of Birth: 11-26-1950  [facility] CM/SW Contact:  Cyndi Bender, RN Phone Number: 10/02/2021, 12:43 PM   Clinical Narrative:    Patient stable for discharge home with hospice. Wife, Tyler Mason, has chosen [facility]. Tyler [facility] accepted referral.  58 Spoke to [place], wife, who states all needed DME is being delivered at this time.  This RNCM spoke to [facility] to arranged for transportation at 1300. Bedside nurse is aware.   Address, Phone number verified.    Final next level of care: Home w Hospice Care Barriers to Discharge: Barriers Resolved   Patient Goals and CMS Choice Patient states their goals for this hospitalization and ongoing recovery are:: wife wants home with hospice CMS Medicare.gov Compare Post Acute Care list provided to:: Patient Represenative (must comment) Choice offered to / list presented to : Spouse  Discharge Placement                 home      Discharge Plan and Services                Rosalia Date [place]: 10/02/21 Time [place]: 5929 Representative spoke with at [place]: Tyler Mason  Social Determinants of Health ([place]) Interventions     Readmission Risk Interventions     No data to display

## 2021-10-02 NOTE — Discharge Summary (Signed)
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 related atrophic change present. Mild chronic ischemic microvascular disease is present. No mass, mass effect, shift of midline structures or acute hemorrhage. No evidence of acute infarction. Mild chronic low attenuation of the right cerebellum likely chronic ischemic change. Vascular: No hyperdense vessel or unexpected calcification. Skull: Prior right frontotemporal craniotomy. Sinuses/Orbits: No acute finding. Other: None. IMPRESSION: 1. No acute findings. 2. Mild chronic ischemic microvascular disease and age related atrophic change. 3. Prior right frontotemporal craniotomy. Electronically Signed   By: Marin Olp M.D.   On: 09/20/2021 10:58    Micro Results    Recent Results (from the past 240 hour(s))  Fungus culture, blood     Status: None   Collection Time: 09/22/21 10:52 AM   Specimen: BLOOD LEFT HAND  Result Value Ref Range Status   Specimen Description BLOOD LEFT HAND  Final   Special  Requests IN PEDIATRIC BOTTLE Blood Culture adequate volume  Final   Culture   Final    NO GROWTH 7 DAYS NO FUNGUS ISOLATED Performed at [facility], [place] [address], [place], [place] [address]    Report Status 09/29/2021 FINAL  Final  CSF culture w Gram Stain     Status: None   Collection Time: 09/24/21 10:30 AM   Specimen: PATH Cytology CSF; Cerebrospinal Fluid  Result Value Ref Range Status   Specimen Description CSF  Final   Special Requests NONE  Final   Gram Stain   Final    WBC PRESENT, PREDOMINANTLY MONONUCLEAR NO ORGANISMS SEEN CYTOSPIN SMEAR    Culture   Final    NO GROWTH 3 DAYS Performed at [facility], [address] [place], [place] [address]    Report Status 09/27/2021 FINAL  Final  Fungus Stain     Status: None   Collection Time: 09/24/21 10:30 AM   Specimen: PATH Cytology CSF; Cerebrospinal Fluid  Result Value Ref Range Status   FUNGUS STAIN Final report  Final    Comment: (NOTE) Performed At: [facility] [place], [place] [id number] Rush Farmer MD [id number]    Fungal Source CSF  Final    Comment: Performed at [facility], [place] [address] [place], [place] [address]  Fungal Stain reflex     Status: None   Collection Time: 09/24/21 10:30 AM  Result Value Ref Range Status   Fungal stain result 1 Comment  Final    Comment: (NOTE) KOH/Calcofluor preparation:  no fungus observed. Performed At: [facility] [place], [place] [id number] Rush Farmer MD [id number]   Urine Culture     Status: None   Collection Time: 09/30/21  9:14 AM   Specimen: Urine, Clean Catch  Result Value Ref Range Status   Specimen Description URINE, CLEAN CATCH  Final   Special Requests NONE  Final   Culture   Final    NO GROWTH Performed at [facility], [place] [address], [place], [place] [address]    Report Status 10/01/2021 FINAL  Final    Today   Subjective    Tyler Mason today in bed  minimally responsive but in no distress   Objective   Blood pressure (!) 120/94, pulse 83, temperature (!) 97.5 F (36.4 C), temperature source Oral, resp. rate 19, height '5\' 10"'$  (1.778 m), weight 77.9 kg, SpO2 92 %.   Intake/Output Summary (Last 24 hours) at 10/02/2021 1038 Last data filed at 10/02/2021 0600 Gross per 24 hour  Intake 100 ml  Output 1100 ml  Net -1000 ml    Exam  Patient in bed in no distress but somnolent, unable to answer questions or follow commands, [email or web address],PERRAL Supple Neck,   Symmetrical Chest wall movement, Good air movement bilaterally, CTAB RRR,No Gallops,   +ve B.Sounds, Abd Soft, Non tender,  No Cyanosis, Clubbing or edema    Data Review   Recent Labs  Lab 09/29/21 0333 10/01/21 0357  WBC 10.4 12.0*  HGB 13.4 12.8*  HCT 38.8* 38.5*  PLT 134* 142*  MCV 87.4 88.5  MCH 30.2 29.4  MCHC 34.5 33.2  RDW 17.8* 17.9*  LYMPHSABS  --  1.5  MONOABS  --  1.3*  EOSABS  --  0.0  BASOSABS  --  0.1    Recent Labs  Lab 09/26/21 0422 09/27/21 0354 09/28/21 0334 09/29/21 0333 09/30/21 0258 10/01/21 0357  NA  --   --  140 137  --  139  K  --   --  4.0 4.0  --  4.2  CL  --   --  112* 108  --  107  CO2  --   --  23 25  --  25  GLUCOSE  --   --  95 84  --  85  BUN  --   --  23 24*  --  29*  CREATININE  --   --  0.96 1.15  --  1.12  CALCIUM  --   --  8.4* 8.2*  --  8.8*  AST  --   --  63* 62*  --   --   ALT  --   --  130* 108*  --   --   ALKPHOS  --   --  124 120  --   --   BILITOT  --   --  0.6 0.7  --   --   ALBUMIN  --   --  2.1* 2.2*  --   --   MG  --   --   --   --   --  2.4  PHOS  --   --   --   --   --  3.3  INR 1.2 1.3* 1.5* 1.9* 1.9*  --     Total Time in preparing paper work, data evaluation and todays exam - 35 minutes  Lala Lund M.D on 10/02/2021 at 10:38 AM  [facility]

## 2021-10-02 NOTE — Progress Notes (Signed)
Routine line care: dressing change due, wife reports line will likely be removed today. Plan is to take pt home.

## 2021-10-03 ENCOUNTER — Other Ambulatory Visit (HOSPITAL_COMMUNITY): Payer: Self-pay

## 2021-10-21 ENCOUNTER — Other Ambulatory Visit: Payer: PPO

## 2021-10-21 ENCOUNTER — Ambulatory Visit: Payer: PPO | Admitting: Oncology

## 2021-11-06 DEATH — deceased

## 2021-12-16 ENCOUNTER — Ambulatory Visit: Payer: PPO | Admitting: Neurology

## 2021-12-18 ENCOUNTER — Ambulatory Visit: Payer: PPO | Admitting: Physician Assistant
